# Patient Record
Sex: Female | Born: 1960 | Race: White | Hispanic: No | Marital: Married | Smoking: Former smoker
Health system: Southern US, Community
[De-identification: ages and names within clinical notes are randomized; demographics above are authoritative.]

## PROBLEM LIST (undated history)

## (undated) DIAGNOSIS — Z8669 Personal history of other diseases of the nervous system and sense organs: Secondary | ICD-10-CM

## (undated) DIAGNOSIS — M5030 Other cervical disc degeneration, unspecified cervical region: Secondary | ICD-10-CM

## (undated) DIAGNOSIS — E0500 Thyrotoxicosis with diffuse goiter without thyrotoxic crisis or storm: Secondary | ICD-10-CM

## (undated) DIAGNOSIS — M47812 Spondylosis without myelopathy or radiculopathy, cervical region: Secondary | ICD-10-CM

## (undated) DIAGNOSIS — F329 Major depressive disorder, single episode, unspecified: Secondary | ICD-10-CM

## (undated) DIAGNOSIS — F419 Anxiety disorder, unspecified: Secondary | ICD-10-CM

## (undated) DIAGNOSIS — N63 Unspecified lump in unspecified breast: Secondary | ICD-10-CM

## (undated) DIAGNOSIS — Z803 Family history of malignant neoplasm of breast: Secondary | ICD-10-CM

## (undated) DIAGNOSIS — M549 Dorsalgia, unspecified: Secondary | ICD-10-CM

## (undated) DIAGNOSIS — M5136 Other intervertebral disc degeneration, lumbar region: Secondary | ICD-10-CM

## (undated) DIAGNOSIS — E785 Hyperlipidemia, unspecified: Secondary | ICD-10-CM

## (undated) DIAGNOSIS — M542 Cervicalgia: Secondary | ICD-10-CM

## (undated) DIAGNOSIS — G4700 Insomnia, unspecified: Secondary | ICD-10-CM

## (undated) DIAGNOSIS — N390 Urinary tract infection, site not specified: Secondary | ICD-10-CM

## (undated) DIAGNOSIS — R002 Palpitations: Secondary | ICD-10-CM

## (undated) HISTORY — DX: Other intervertebral disc degeneration, lumbar region: M51.36

## (undated) HISTORY — DX: Major depressive disorder, single episode, unspecified: F32.9

## (undated) HISTORY — DX: Urinary tract infection, site not specified: N39.0

## (undated) HISTORY — PX: BREAST BIOPSY: SHX20

## (undated) HISTORY — DX: Other cervical disc degeneration, unspecified cervical region: M50.30

## (undated) HISTORY — DX: Cervicalgia: M54.2

## (undated) HISTORY — DX: Palpitations: R00.2

## (undated) HISTORY — DX: Family history of malignant neoplasm of breast: Z80.3

## (undated) HISTORY — DX: Unspecified lump in unspecified breast: N63.0

## (undated) HISTORY — DX: Dorsalgia, unspecified: M54.9

## (undated) HISTORY — DX: Anxiety disorder, unspecified: F41.9

## (undated) HISTORY — DX: Personal history of other diseases of the nervous system and sense organs: Z86.69

## (undated) HISTORY — DX: Hyperlipidemia, unspecified: E78.5

## (undated) HISTORY — PX: REDUCTION MAMMAPLASTY: SUR839

## (undated) HISTORY — DX: Thyrotoxicosis with diffuse goiter without thyrotoxic crisis or storm: E05.00

## (undated) HISTORY — DX: Insomnia, unspecified: G47.00

## (undated) HISTORY — DX: Spondylosis without myelopathy or radiculopathy, cervical region: M47.812

---

## 2005-12-08 ENCOUNTER — Emergency Department (HOSPITAL_COMMUNITY): Admission: EM | Admit: 2005-12-08 | Discharge: 2005-12-08 | Payer: Self-pay | Admitting: Family Medicine

## 2006-06-04 ENCOUNTER — Ambulatory Visit: Payer: Self-pay | Admitting: Internal Medicine

## 2006-06-07 ENCOUNTER — Encounter: Admission: RE | Admit: 2006-06-07 | Discharge: 2006-06-07 | Payer: Self-pay | Admitting: Internal Medicine

## 2006-06-22 ENCOUNTER — Encounter: Admission: RE | Admit: 2006-06-22 | Discharge: 2006-06-22 | Payer: Self-pay | Admitting: Internal Medicine

## 2006-07-24 ENCOUNTER — Ambulatory Visit: Payer: Self-pay | Admitting: Family Medicine

## 2007-01-19 ENCOUNTER — Telehealth: Payer: Self-pay | Admitting: Internal Medicine

## 2007-02-18 ENCOUNTER — Telehealth: Payer: Self-pay | Admitting: Internal Medicine

## 2007-05-11 ENCOUNTER — Ambulatory Visit: Payer: Self-pay | Admitting: Internal Medicine

## 2007-05-11 DIAGNOSIS — N390 Urinary tract infection, site not specified: Secondary | ICD-10-CM | POA: Insufficient documentation

## 2007-05-11 HISTORY — DX: Urinary tract infection, site not specified: N39.0

## 2007-05-11 LAB — CONVERTED CEMR LAB
Bacteria, UA: NEGATIVE
Crystals: NEGATIVE
Mucus, UA: NEGATIVE
Total Protein, Urine: NEGATIVE mg/dL
pH: 6.5 (ref 5.0–8.0)

## 2007-07-26 ENCOUNTER — Ambulatory Visit: Payer: Self-pay | Admitting: Internal Medicine

## 2007-07-26 DIAGNOSIS — M542 Cervicalgia: Secondary | ICD-10-CM | POA: Insufficient documentation

## 2007-07-26 DIAGNOSIS — F329 Major depressive disorder, single episode, unspecified: Secondary | ICD-10-CM

## 2007-07-26 DIAGNOSIS — M549 Dorsalgia, unspecified: Secondary | ICD-10-CM

## 2007-07-26 DIAGNOSIS — F32A Depression, unspecified: Secondary | ICD-10-CM | POA: Insufficient documentation

## 2007-07-26 DIAGNOSIS — F3289 Other specified depressive episodes: Secondary | ICD-10-CM

## 2007-07-26 HISTORY — DX: Dorsalgia, unspecified: M54.9

## 2007-07-26 HISTORY — DX: Major depressive disorder, single episode, unspecified: F32.9

## 2007-07-26 HISTORY — DX: Cervicalgia: M54.2

## 2007-07-26 HISTORY — DX: Other specified depressive episodes: F32.89

## 2007-08-24 ENCOUNTER — Encounter: Admission: RE | Admit: 2007-08-24 | Discharge: 2007-11-22 | Payer: Self-pay | Admitting: Internal Medicine

## 2007-08-26 ENCOUNTER — Encounter: Payer: Self-pay | Admitting: Internal Medicine

## 2007-10-10 ENCOUNTER — Encounter: Payer: Self-pay | Admitting: Internal Medicine

## 2007-11-17 ENCOUNTER — Encounter: Admission: RE | Admit: 2007-11-17 | Discharge: 2007-11-17 | Payer: Self-pay | Admitting: Internal Medicine

## 2007-11-17 LAB — HM MAMMOGRAPHY

## 2007-11-30 ENCOUNTER — Encounter: Admission: RE | Admit: 2007-11-30 | Discharge: 2007-11-30 | Payer: Self-pay | Admitting: Internal Medicine

## 2007-12-02 ENCOUNTER — Encounter: Payer: Self-pay | Admitting: Internal Medicine

## 2008-01-02 ENCOUNTER — Telehealth (INDEPENDENT_AMBULATORY_CARE_PROVIDER_SITE_OTHER): Payer: Self-pay | Admitting: *Deleted

## 2008-06-21 ENCOUNTER — Encounter: Payer: Self-pay | Admitting: Internal Medicine

## 2008-08-30 ENCOUNTER — Emergency Department (HOSPITAL_COMMUNITY): Admission: EM | Admit: 2008-08-30 | Discharge: 2008-08-30 | Payer: Self-pay | Admitting: Emergency Medicine

## 2008-09-21 ENCOUNTER — Ambulatory Visit: Payer: Self-pay | Admitting: Internal Medicine

## 2008-09-21 DIAGNOSIS — R002 Palpitations: Secondary | ICD-10-CM

## 2008-09-21 HISTORY — DX: Palpitations: R00.2

## 2008-10-29 ENCOUNTER — Encounter (INDEPENDENT_AMBULATORY_CARE_PROVIDER_SITE_OTHER): Payer: Self-pay | Admitting: Obstetrics & Gynecology

## 2008-10-29 ENCOUNTER — Inpatient Hospital Stay (HOSPITAL_COMMUNITY): Admission: RE | Admit: 2008-10-29 | Discharge: 2008-11-01 | Payer: Self-pay | Admitting: Obstetrics & Gynecology

## 2008-11-02 ENCOUNTER — Encounter: Admission: RE | Admit: 2008-11-02 | Discharge: 2008-11-06 | Payer: Self-pay | Admitting: Obstetrics & Gynecology

## 2008-11-14 ENCOUNTER — Telehealth: Payer: Self-pay | Admitting: Internal Medicine

## 2008-11-14 DIAGNOSIS — N63 Unspecified lump in unspecified breast: Secondary | ICD-10-CM | POA: Insufficient documentation

## 2008-11-14 HISTORY — DX: Unspecified lump in unspecified breast: N63.0

## 2008-11-19 ENCOUNTER — Encounter: Admission: RE | Admit: 2008-11-19 | Discharge: 2008-11-19 | Payer: Self-pay | Admitting: Internal Medicine

## 2008-12-03 ENCOUNTER — Telehealth: Payer: Self-pay | Admitting: Internal Medicine

## 2008-12-05 ENCOUNTER — Ambulatory Visit: Payer: Self-pay | Admitting: Internal Medicine

## 2009-01-02 ENCOUNTER — Telehealth: Payer: Self-pay | Admitting: Internal Medicine

## 2009-01-09 ENCOUNTER — Telehealth: Payer: Self-pay | Admitting: Internal Medicine

## 2009-03-22 ENCOUNTER — Ambulatory Visit: Payer: Self-pay | Admitting: Internal Medicine

## 2009-03-22 LAB — CONVERTED CEMR LAB
ALT: 18 units/L (ref 0–35)
BUN: 18 mg/dL (ref 6–23)
Basophils Relative: 0 % (ref 0.0–3.0)
Chloride: 102 meq/L (ref 96–112)
Cholesterol: 183 mg/dL (ref 0–200)
Eosinophils Relative: 6.7 % — ABNORMAL HIGH (ref 0.0–5.0)
HCT: 37.2 % (ref 36.0–46.0)
Lymphs Abs: 1.3 10*3/uL (ref 0.7–4.0)
MCV: 95 fL (ref 78.0–100.0)
Monocytes Absolute: 0.5 10*3/uL (ref 0.1–1.0)
Platelets: 294 10*3/uL (ref 150.0–400.0)
Potassium: 4.5 meq/L (ref 3.5–5.1)
RBC: 3.91 M/uL (ref 3.87–5.11)
Specific Gravity, Urine: 1.02 (ref 1.000–1.030)
Total Protein, Urine: NEGATIVE mg/dL
Total Protein: 6.8 g/dL (ref 6.0–8.3)
Triglycerides: 106 mg/dL (ref 0.0–149.0)
Urine Glucose: NEGATIVE mg/dL
Urobilinogen, UA: 0.2 (ref 0.0–1.0)
WBC: 5.2 10*3/uL (ref 4.5–10.5)

## 2009-06-07 ENCOUNTER — Ambulatory Visit: Payer: Self-pay | Admitting: Internal Medicine

## 2009-06-07 DIAGNOSIS — M5030 Other cervical disc degeneration, unspecified cervical region: Secondary | ICD-10-CM | POA: Insufficient documentation

## 2009-06-07 HISTORY — DX: Other cervical disc degeneration, unspecified cervical region: M50.30

## 2009-07-24 ENCOUNTER — Telehealth: Payer: Self-pay | Admitting: Internal Medicine

## 2009-07-25 ENCOUNTER — Encounter: Payer: Self-pay | Admitting: Internal Medicine

## 2009-08-23 ENCOUNTER — Telehealth: Payer: Self-pay | Admitting: Internal Medicine

## 2009-09-23 ENCOUNTER — Telehealth: Payer: Self-pay | Admitting: Internal Medicine

## 2009-12-16 ENCOUNTER — Encounter: Payer: Self-pay | Admitting: Internal Medicine

## 2010-03-02 ENCOUNTER — Encounter: Payer: Self-pay | Admitting: Internal Medicine

## 2010-03-11 NOTE — Progress Notes (Signed)
Summary: Rf Hydrco/Acetamin  Phone Note Refill Request Message from:  Pharmacy  Refills Requested: Medication #1:  HYDROCODONE-ACETAMINOPHEN 5-325 MG TABS 1po q 6 hrs as needed pain   Dosage confirmed as above?Dosage Confirmed   Supply Requested: 100   Last Refilled: 06/30/2009  Method Requested: Telephone to Pharmacy Next Appointment Scheduled: none Initial call taken by: Lanier Prude, [facility]),  August 23, 2009 8:22 AM  Follow-up for Phone Call        this was not meant to be a long term med  - did she see ortho may 25?    if so, I will need a report from that office Follow-up by: Corwin Levins MD,  August 23, 2009 8:29 AM  Additional Follow-up for Phone Call Additional follow up Details #1::        Return call to pt, no answer.. will try again later.Marland KitchenMarland KitchenAlvy Beal Archie CMA  August 23, 2009 8:51 AM   No answer/no machine. Lucious Groves CMA  August 26, 2009 9:18 AM   No answer, no VM. Margaret Pyle, CMA  August 27, 2009 9:04 AM     Additional Follow-up for Phone Call Additional follow up Details #2::    No answer, no VM. 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 Unable to locate pt. Closing phone note until further contact from pt Follow-up by: Margaret Pyle, CMA,  August 28, 2009 9:00 AM

## 2010-03-11 NOTE — Consult Note (Signed)
Summary: [facility]  [facility]   Imported By: Lennie Odor 12/19/2009 14:41:35  _____________________________________________________________________  External Attachment:    Type:   Image     Comment:   External Document

## 2010-03-11 NOTE — Progress Notes (Signed)
Summary: medication refill  Phone Note Refill Request Message from:  Fax from Pharmacy on September 23, 2009 9:03 AM  Refills Requested: Medication #1:  CLONAZEPAM 0.5 MG TABS 1 by mouth two times a day as needed   Dosage confirmed as above?Dosage Confirmed   Last Refilled: 03/22/2009   Notes: [facility], [phone] Initial call taken by: Zella Ball Ewing CMA Duncan Dull),  September 23, 2009 9:03 AM    New/Updated Medications: CLONAZEPAM 0.5 MG TABS (CLONAZEPAM) 1 by mouth two times a day as needed Prescriptions: CLONAZEPAM 0.5 MG TABS (CLONAZEPAM) 1 by mouth two times a day as needed  #60 x 5   Entered and Authorized by:   Corwin Levins MD   Signed by:   Corwin Levins MD on 09/23/2009   Method used:   Print then Give to Patient   RxID:   [id number]  done hardcopy to LIM side B - dahlia  Corwin Levins MD  September 23, 2009 1:16 PM   Rx faxed to pharmacy Margaret Pyle, CMA  September 23, 2009 1:25 PM

## 2010-03-11 NOTE — Progress Notes (Signed)
Summary: Omeprazole PA  Phone Note From Pharmacy   Details of Request: [employer] ID:  [id number] Details of Action Taken: [id number] Summary of Call: PA request--Omeprazole. Form requested. Initial call taken by: Lucious Groves,  July 24, 2009 3:08 PM  Follow-up for Phone Call        Form completed and faxed back, will await insurance company reply. Lucious Groves  July 25, 2009 11:36 AM      Appended Document: Omeprazole PA Called automated line and prescription is approved until 07/2010

## 2010-03-11 NOTE — Assessment & Plan Note (Signed)
Summary: DISCUSS PAIN MEDS/NWS   Vital Signs:  Patient profile:   50 year old female Height:      66 inches Weight:      142.75 pounds BMI:     23.12 O2 Sat:      97 % on Room air Temp:     97.6 degrees F oral Pulse rate:   67 / minute BP sitting:   100 / 60  (left arm) Cuff size:   regular  Vitals Entered ByZella Ball Ewing (June 07, 2009 3:04 PM)  O2 Flow:  Room air CC: Discuss Pain medication/RE   CC:  Discuss Pain medication/RE.  History of Present Illness: naporosyn helps but still with significant neck pain  constant; with now freq breakthrough pain , tramadol and muscle relaxer dont work at all;  has appt with dr Tad Moore on may 25;  going to [place] next wk;  overall pain now at least 5-7/10, denies radicalar pain, or extremity pain, weak, numbness, bowel or bladder change, fever, night sweats, wt loss or other constituioinal symtpoms.    Problems Prior to Update: 1)  Preventive Health Care  (ICD-V70.0) 2)  Lump or Mass in Breast  (ICD-611.72) 3)  Palpitations  (ICD-785.1) 4)  Depressive Disorder  (ICD-311) 5)  Back Pain  (ICD-724.5) 6)  Cervicalgia  (ICD-723.1) 7)  Uti  (ICD-599.0)  Medications Prior to Update: 1)  Pre-Natal Formula  Tabs (Prenatal Multivit-Min-Fe-Fa) .... Tab By Mouth Once Daily 2)  Calcium 500 Mg Tabs (Calcium Carbonate) .Marland Kitchen.. 1 Tab By Mouth Once Daily 3)  Clonazepam 0.5 Mg Tabs (Clonazepam) .Marland Kitchen.. 1 By Mouth Two Times A Day As Needed 4)  Naprelan 375 Mg Xr24h-Tab (Naproxen Sodium) .... 2 By Mouth Once Daily 5)  Omeprazole 20 Mg Cpdr (Omeprazole) .Marland Kitchen.. 1 By Mouth Once Daily  Current Medications (verified): 1)  Pre-Natal Formula  Tabs (Prenatal Multivit-Min-Fe-Fa) .... Tab By Mouth Once Daily 2)  Calcium 500 Mg Tabs (Calcium Carbonate) .Marland Kitchen.. 1 Tab By Mouth Once Daily 3)  Clonazepam 0.5 Mg Tabs (Clonazepam) .Marland Kitchen.. 1 By Mouth Two Times A Day As Needed 4)  Naprelan 375 Mg Xr24h-Tab (Naproxen Sodium) .... 2 By Mouth Once Daily 5)  Omeprazole 20 Mg Cpdr  (Omeprazole) .Marland Kitchen.. 1 By Mouth Once Daily 6)  Hydrocodone-Acetaminophen 5-325 Mg Tabs (Hydrocodone-Acetaminophen) .Marland Kitchen.. 1po Q 6 Hrs As Needed Pain 7)  Cymbalta 60 Mg Cpep (Duloxetine Hcl) .Marland Kitchen.. 1po Once Daily  Allergies (verified): No Known Drug Allergies  Past History:  Past Medical History: Last updated: 09/18/2008 History of Grave's Disease / hyperthyroidism History of Migraine headaches  Past Surgical History: Last updated: 09/18/2008 Breast biopsy  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 from previous marriage that lives in [place] 2nd child born [place], sept 2010 Former Smoker - smoked for 6 yrs, quit 15 yrs ago Alcohol use-yes Drug use-no  Risk Factors: Smoking Status: quit (05/11/2007)  Review of Systems       all otherwise negative per pt -    Physical Exam  General:  alert and well-developed.   Head:  normocephalic and atraumatic.   Eyes:  vision grossly intact, pupils equal, and pupils round.   Ears:  R ear normal and L ear normal.   Nose:  no external deformity and no nasal discharge.   Mouth:  no gingival abnormalities and pharynx pink and moist.   Neck:  supple and no masses.   Lungs:  normal respiratory effort and normal breath sounds.  Heart:  normal rate and regular rhythm.   Msk:  no joint tenderness and no joint swelling. , no spine tenderness or spasm   Extremities:  no edema, no erythema  Neurologic:  alert & oriented X3, strength normal in all extremities, and gait normal.   Skin:  color normal and no rashes.   Psych:  dysphoric affect and moderately anxious.     Impression & Recommendations:  Problem # 1:  CERVICALGIA (ICD-723.1)  Her updated medication list for this problem includes:    Naprelan 375 Mg Xr24h-tab (Naproxen sodium) .Marland Kitchen... 2 by mouth once daily    Hydrocodone-acetaminophen 5-325 Mg Tabs (Hydrocodone-acetaminophen) .Marland Kitchen... 1po q 6 hrs as needed pain with known cervical disc  dz (reviewed most recnet neck films with pt) ;  to add cymbalta 30 for1 wk, then 60 after that, and vicodin as needed breakthrough pain, to see ortho may 25 as planned, treat as above, f/u any worsening signs or symptoms   Problem # 2:  DEPRESSIVE DISORDER (ICD-311)  Her updated medication list for this problem includes:    Clonazepam 0.5 Mg Tabs (Clonazepam) .Marland Kitchen... 1 by mouth two times a day as needed    Cymbalta 60 Mg Cpep (Duloxetine hcl) .Marland Kitchen... 1po once daily treat as above, f/u any worsening signs or symptoms   Problem # 3:  DEGENERATIVE DISC DISEASE, CERVICAL SPINE (ICD-722.4) as above, most likely source of pain, d/w pt   Complete Medication List: 1)  Pre-natal Formula Tabs (Prenatal multivit-min-fe-fa) .... Tab by mouth once daily 2)  Calcium 500 Mg Tabs (Calcium carbonate) .Marland Kitchen.. 1 tab by mouth once daily 3)  Clonazepam 0.5 Mg Tabs (Clonazepam) .Marland Kitchen.. 1 by mouth two times a day as needed 4)  Naprelan 375 Mg Xr24h-tab (Naproxen sodium) .... 2 by mouth once daily 5)  Omeprazole 20 Mg Cpdr (Omeprazole) .Marland Kitchen.. 1 by mouth once daily 6)  Hydrocodone-acetaminophen 5-325 Mg Tabs (Hydrocodone-acetaminophen) .Marland Kitchen.. 1po q 6 hrs as needed pain 7)  Cymbalta 60 Mg Cpep (Duloxetine hcl) .Marland Kitchen.. 1po once daily  Patient Instructions: 1)  Please take all new medications as prescribed  - the cymbalta is 30mg  per day for one wk, then 60 mg per day after that 2)  take the hydrocodone for breakthrough pain as needed 3)  Continue all previous medications as before this visit  4)  Please keep your appt with orthopedic may 25 as planned 5)  Please schedule a follow-up appointment as needed. Prescriptions: CYMBALTA 60 MG CPEP (DULOXETINE HCL) 1po once daily  #30 x 11   Entered and Authorized by:   Corwin Levins MD   Signed by:   Corwin Levins MD on 06/07/2009   Method used:   Print then Give to Patient   RxID:   [id number] HYDROCODONE-ACETAMINOPHEN 5-325 MG TABS (HYDROCODONE-ACETAMINOPHEN) 1po q 6 hrs as  needed pain  #100 x 1   Entered and Authorized by:   Corwin Levins MD   Signed by:   Corwin Levins MD on 06/07/2009   Method used:   Print then Give to Patient   RxID:   [id number]

## 2010-03-11 NOTE — Medication Information (Signed)
Summary: Prior Auth/medco  Prior Auth/medco   Imported By: Lester Bessemer 07/29/2009 08:08:33  _____________________________________________________________________  External Attachment:    Type:   Image     Comment:   External Document

## 2010-03-11 NOTE — Assessment & Plan Note (Signed)
Summary: fu--med-stc   Vital Signs:  Patient profile:   50 year old female Height:      66 inches Weight:      145 pounds BMI:     23.49 O2 Sat:      95 % on Room air Temp:     97 degrees F oral Pulse rate:   91 / minute BP sitting:   100 / 70  (left arm) Cuff size:   regular  Vitals Entered ByZella Ball Ewing (March 22, 2009 3:52 PM)  O2 Flow:  Room air  Preventive Care Screening     declines colonoscopy, tetanus,flu shots  CC: followup on meds/RE   CC:  followup on meds/RE.  History of Present Illness: overall doing well, no complaints, Pt denies CP, sob, doe, wheezing, orthopnea, pnd, worsening LE edema, palps, dizziness or syncope   Pt denies new neuro symptoms such as headache, facial or extremity weakness .  has recurring mild lower back discomfort no change in 3 mo since last seen ortho and tx with naprelan ER;  no GI upset or bleeding;  no bowel or baldder change, fever, or change in LE pain, weakness or numbness.   Problems Prior to Update: 1)  Preventive Health Care  (ICD-V70.0) 2)  Lump or Mass in Breast  (ICD-611.72) 3)  Palpitations  (ICD-785.1) 4)  Depressive Disorder  (ICD-311) 5)  Back Pain  (ICD-724.5) 6)  Cervicalgia  (ICD-723.1) 7)  Uti  (ICD-599.0)  Medications Prior to Update: 1)  Pre-Natal Formula  Tabs (Prenatal Multivit-Min-Fe-Fa) .... Tab By Mouth Once Daily 2)  Calcium 500 Mg Tabs (Calcium Carbonate) .Marland Kitchen.. 1 Tab By Mouth Once Daily 3)  Tramadol Hcl 50 Mg Tabs (Tramadol Hcl) .Marland Kitchen.. 1 By Mouth Q 6 Hrs As Needed 4)  Flexeril 5 Mg Tabs (Cyclobenzaprine Hcl) .Marland Kitchen.. 1po Three Times A Day As Needed 5)  Clonazepam 0.5 Mg Tabs (Clonazepam) .Marland Kitchen.. 1 By Mouth Two Times A Day As Needed  Current Medications (verified): 1)  Pre-Natal Formula  Tabs (Prenatal Multivit-Min-Fe-Fa) .... Tab By Mouth Once Daily 2)  Calcium 500 Mg Tabs (Calcium Carbonate) .Marland Kitchen.. 1 Tab By Mouth Once Daily 3)  Clonazepam 0.5 Mg Tabs (Clonazepam) .Marland Kitchen.. 1 By Mouth Two Times A Day As  Needed 4)  Naprelan 375 Mg Xr24h-Tab (Naproxen Sodium) .... 2 By Mouth Once Daily 5)  Omeprazole 20 Mg Cpdr (Omeprazole) .Marland Kitchen.. 1 By Mouth Once Daily  Allergies (verified): No Known Drug Allergies  Past History:  Past Medical History: Last updated: 09/18/2008 History of Grave's Disease / hyperthyroidism History of Migraine headaches  Past Surgical History: Last updated: 09/18/2008 Breast biopsy  Family History: Last updated: 10-21-08 Father deceased at 74 secondary to complications of lung cancer.  He was a smoker.  Mother at age 66, in addition to breast cancer, is hypertensive.  No family history of colon cancer, heart disease, or stroke, or type 2 diabetes.  No family history of sudden death.  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 from previous marriage that lives in [place] 2nd child born [place], sept 2010 Former Smoker - smoked for 6 yrs, quit 15 yrs ago Alcohol use-yes Drug use-no  Risk Factors: Smoking Status: quit (05/11/2007)  Review of Systems  The patient denies anorexia, fever, weight loss, weight gain, vision loss, decreased hearing, hoarseness, chest pain, syncope, dyspnea on exertion, peripheral edema, prolonged cough, headaches, hemoptysis, abdominal pain, melena, hematochezia, severe indigestion/heartburn, hematuria, incontinence, muscle weakness, suspicious skin lesions,  transient blindness, difficulty walking, depression, unusual weight change, abnormal bleeding, enlarged lymph nodes, and angioedema.         all otherwise negative per pt -   Physical Exam  General:  alert and well-developed.   Head:  normocephalic and atraumatic.   Eyes:  vision grossly intact, pupils equal, and pupils round.   Ears:  R ear normal and L ear normal.   Nose:  no external deformity and no nasal discharge.   Mouth:  no gingival abnormalities and pharynx pink and moist.   Neck:  supple and no masses.   Lungs:   normal respiratory effort and normal breath sounds.   Heart:  normal rate and regular rhythm.   Abdomen:  soft, non-tender, and normal bowel sounds.   Msk:  no joint tenderness and no joint swelling.   Extremities:  no edema, no erythema  Neurologic:  cranial nerves II-XII intact and strength normal in all extremities.     Impression & Recommendations:  Problem # 1:  Preventive Health Care (ICD-V70.0)  Overall doing well, age appropriate education and counseling updated and referral for appropriate preventive services done unless declined, immunizations up to date or declined, diet counseling done if overweight, urged to quit smoking if smokes , most recent labs reviewed and current ordered if appropriate, ecg reviewed or declined (interpretation per ECG scanned in the EMR if done); information regarding Medicare Prevention requirements given if appropriate , decliens flu shot, tetanus and colonoscopy  Orders: TLB-BMP (Basic Metabolic Panel-BMET) (80048-METABOL) TLB-CBC Platelet - w/Differential (85025-CBCD) TLB-Hepatic/Liver Function Pnl (80076-HEPATIC) TLB-Lipid Panel (80061-LIPID) TLB-TSH (Thyroid Stimulating Hormone) (84443-TSH) TLB-Udip ONLY (81003-UDIP)  Problem # 2:  BACK PAIN (ICD-724.5)  The following medications were removed from the medication list:    Tramadol Hcl 50 Mg Tabs (Tramadol hcl) .Marland Kitchen... 1 by mouth q 6 hrs as needed    Flexeril 5 Mg Tabs (Cyclobenzaprine hcl) .Marland Kitchen... 1po three times a day as needed Her updated medication list for this problem includes:    Naprelan 375 Mg Xr24h-tab (Naproxen sodium) .Marland Kitchen... 2 by mouth once daily treat as above, f/u any worsening signs or symptoms , also add PPI for gastric prevention  Complete Medication List: 1)  Pre-natal Formula Tabs (Prenatal multivit-min-fe-fa) .... Tab by mouth once daily 2)  Calcium 500 Mg Tabs (Calcium carbonate) .Marland Kitchen.. 1 tab by mouth once daily 3)  Clonazepam 0.5 Mg Tabs (Clonazepam) .Marland Kitchen.. 1 by mouth two times a  day as needed 4)  Naprelan 375 Mg Xr24h-tab (Naproxen sodium) .... 2 by mouth once daily 5)  Omeprazole 20 Mg Cpdr (Omeprazole) .Marland Kitchen.. 1 by mouth once daily  Patient Instructions: 1)  Please take all new medications as prescribed  2)  Continue all previous medications as before this visit 3)  Please go to the Lab in the basement for your blood and/or urine tests today  4)  Please schedule a follow-up appointment in 1 year or sooner if needed Prescriptions: CLONAZEPAM 0.5 MG TABS (CLONAZEPAM) 1 by mouth two times a day as needed  #60 x 5   Entered and Authorized by:   Corwin Levins MD   Signed by:   Corwin Levins MD on 03/22/2009   Method used:   Print then Give to Patient   RxID:   [id number] NAPRELAN 375 MG XR24H-TAB (NAPROXEN SODIUM) 2 by mouth once daily  #60 x 5   Entered and Authorized by:   Corwin Levins MD   Signed by:   Len Blalock  John MD on 03/22/2009   Method used:   Print then Give to Patient   RxID:   [id number] OMEPRAZOLE 20 MG CPDR (OMEPRAZOLE) 1 by mouth once daily  #90 x 3   Entered and Authorized by:   Corwin Levins MD   Signed by:   Corwin Levins MD on 03/22/2009   Method used:   Print then Give to Patient   RxID:   [id number]

## 2010-05-16 LAB — CBC
Hemoglobin: 8.7 g/dL — ABNORMAL LOW (ref 12.0–15.0)
MCV: 90.3 fL (ref 78.0–100.0)
Platelets: 177 10*3/uL (ref 150–400)
RDW: 16.2 % — ABNORMAL HIGH (ref 11.5–15.5)
RDW: 16.2 % — ABNORMAL HIGH (ref 11.5–15.5)
WBC: 5.4 10*3/uL (ref 4.0–10.5)

## 2010-05-16 LAB — RPR: RPR Ser Ql: NONREACTIVE

## 2010-06-01 ENCOUNTER — Encounter: Payer: Self-pay | Admitting: Internal Medicine

## 2010-06-01 DIAGNOSIS — Z0000 Encounter for general adult medical examination without abnormal findings: Secondary | ICD-10-CM | POA: Insufficient documentation

## 2010-06-01 DIAGNOSIS — Z0001 Encounter for general adult medical examination with abnormal findings: Secondary | ICD-10-CM | POA: Insufficient documentation

## 2010-06-02 ENCOUNTER — Ambulatory Visit (INDEPENDENT_AMBULATORY_CARE_PROVIDER_SITE_OTHER): Payer: BC Managed Care – PPO | Admitting: Internal Medicine

## 2010-06-02 ENCOUNTER — Encounter: Payer: Self-pay | Admitting: Internal Medicine

## 2010-06-02 VITALS — BP 112/70 | HR 73 | Temp 98.0°F | Ht 66.0 in | Wt 148.0 lb

## 2010-06-02 DIAGNOSIS — G4700 Insomnia, unspecified: Secondary | ICD-10-CM

## 2010-06-02 DIAGNOSIS — L989 Disorder of the skin and subcutaneous tissue, unspecified: Secondary | ICD-10-CM

## 2010-06-02 DIAGNOSIS — F411 Generalized anxiety disorder: Secondary | ICD-10-CM

## 2010-06-02 DIAGNOSIS — F419 Anxiety disorder, unspecified: Secondary | ICD-10-CM

## 2010-06-02 DIAGNOSIS — Z0000 Encounter for general adult medical examination without abnormal findings: Secondary | ICD-10-CM

## 2010-06-02 HISTORY — DX: Insomnia, unspecified: G47.00

## 2010-06-02 HISTORY — DX: Anxiety disorder, unspecified: F41.9

## 2010-06-02 MED ORDER — CLONAZEPAM 0.5 MG PO TABS: 0.5000 mg | ORAL_TABLET | Freq: Two times a day (BID) | ORAL | Status: DC | PRN

## 2010-06-02 MED ORDER — TRIAMCINOLONE ACETONIDE 0.1 % EX CREA: TOPICAL_CREAM | Freq: Two times a day (BID) | CUTANEOUS | Status: DC

## 2010-06-02 NOTE — Assessment & Plan Note (Signed)
Mild to mod ongoing, mostly likely related to her chronic pain, declines need for counseling, or trial of SSRI such as lexapro,  to f/u any worsening symptoms or concerns

## 2010-06-02 NOTE — Assessment & Plan Note (Signed)
New x 4 mo, benign appearing , will try triam cr and refer to derm, pt is reassured very unlikely to represent malignancy, but may need removed per derm;  to f/u any worsening symptoms or concerns

## 2010-06-02 NOTE — Patient Instructions (Addendum)
Take all new medications as prescribed Continue all other medications as before You will be contacted regarding the referral for: dermatology Please return in 6 mo with Lab testing done 3-5 days before

## 2010-06-02 NOTE — Progress Notes (Signed)
Subjective:    Patient ID: Cassandra Gallagher, female    DOB: [birth year], 50 y.o.   MRN: [id number]  HPI  Here to f/uy to c/o 4 mo worsening skin lesion to left dorsal foot, started as small, nontender tan nonraised, now with wearing certain shoes (which she states do not seem to pinch or irratate) has now become a larger, red, mild tender firm nodular lesion raised that wont seem to get better.  Pt denies chest pain, increased sob or doe, wheezing, orthopnea, PND, increased LE swelling, palpitations, dizziness or syncope.  Denies worsening depressive symptoms, suicidal ideation, or panic.  Does still need the clonazepam for sleep.  Unfortunately the trial of cymbalta for pain/depression/anxiety did not seem to help, so she d/c some time ago.  Still sees Dr Minna Merritts for ongoing chronic spine pain,  Has had several ESI that helped temporarily Past Medical History  Diagnosis Date  . DEPRESSIVE DISORDER 07/26/2007  . UTI 05/11/2007  . Lump or mass in breast 11/14/2008  . 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 Cervicalgia 07/26/2007  . BACK PAIN 07/26/2007  . Palpitations 09/21/2008  . Grave's disease     hx of Grave's Disease/Hyperthyroidism  . Hx of migraines    Past Surgical History  Procedure Date  . Breast biopsy     reports that she has quit smoking. She does not have any smokeless tobacco history on file. She reports that she does not drink alcohol or use illicit drugs. family history includes Hypertension in her mother. No Known Allergies Current Outpatient Prescriptions on File Prior to Visit  Medication Sig Dispense Refill  . omeprazole (PRILOSEC) 20 MG capsule Take 20 mg by mouth daily.        Marland Kitchen DISCONTD: clonazePAM (KLONOPIN) 0.5 MG tablet Take 0.5 mg by mouth 2 (two) times daily as needed.        . Calcium Carbonate (CALCIUM 500 PO) Take by mouth daily.        Marland Kitchen HYDROcodone-acetaminophen (NORCO) 5-325 MG per tablet Take 1 tablet by mouth every 6 (six) hours as needed.         . Naproxen Sodium (NAPRELAN) 375 MG TB24 2 (two) times daily.        . Prenatal Multivit-Min-Fe-FA (PRE-NATAL FORMULA) TABS Take by mouth daily.        Marland Kitchen DISCONTD: DULoxetine (CYMBALTA) 60 MG capsule Take 60 mg by mouth daily.          Review of Systems Review of Systems  Constitutional: Negative for diaphoresis and unexpected weight change.  HENT: Negative for drooling and tinnitus.   Eyes: Negative for photophobia and visual disturbance.  Respiratory: Negative for choking and stridor.   Gastrointestinal: Negative for vomiting and blood in stool.  Genitourinary: Negative for hematuria and decreased urine volume.  Musculoskeletal: Negative for gait problem.  Skin: Negative for color change and wound.  Neurological: Negative for tremors and numbness.  Psychiatric/Behavioral: Negative for decreased concentration. The patient is not hyperactive.       Objective:   Physical Exam BP 112/70  Pulse 73  Temp(Src) 98 F (36.7 C) (Oral)  Ht 5\' 6"  (1.676 m)  Wt 148 lb (67.132 kg)  BMI 23.89 kg/m2  SpO2 98% Physical Exam  VS noted Constitutional: Pt appears well-developed and well-nourished.  HENT: Head: Normocephalic.  Right Ear: External ear normal.  Left Ear: External ear normal.  Eyes: Conjunctivae and EOM are normal. Pupils are equal, round, and reactive to light.  Neck: Normal  range of motion. Neck supple.  Cardiovascular: Normal rate and regular rhythm.   Pulmonary/Chest: Effort normal and breath sounds normal.  Abd:  Soft, NT, non-distended, + BS Neurological: Pt is alert. No cranial nerve deficit.  Skin: Skin is warm. No erythema.  Left dorsal foot with approx 10 mm red raised firm lesion just medial to the first dorsal tendon , somewhat tender but no fluctuance, drainage, red streaks Psychiatric: Pt behavior is normal. Thought content normal.  2+ nervous        Assessment & Plan:

## 2010-06-02 NOTE — Assessment & Plan Note (Signed)
stable overall by hx and exam, most recent lab reviewed with pt, and pt to continue medical treatment as before - the clonazapem is refilles

## 2010-06-03 ENCOUNTER — Other Ambulatory Visit: Payer: Self-pay

## 2010-06-03 DIAGNOSIS — L989 Disorder of the skin and subcutaneous tissue, unspecified: Secondary | ICD-10-CM

## 2010-06-03 MED ORDER — TRIAMCINOLONE ACETONIDE 0.1 % EX CREA: TOPICAL_CREAM | Freq: Two times a day (BID) | CUTANEOUS | Status: DC

## 2010-06-03 NOTE — Telephone Encounter (Signed)
Patients insurance will only cover 90 days of Triamcinolone 0.1% cream.
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
## 2010-09-12 ENCOUNTER — Other Ambulatory Visit (INDEPENDENT_AMBULATORY_CARE_PROVIDER_SITE_OTHER): Payer: BC Managed Care – PPO

## 2010-09-12 ENCOUNTER — Encounter: Payer: Self-pay | Admitting: Internal Medicine

## 2010-09-12 ENCOUNTER — Telehealth: Payer: Self-pay

## 2010-09-12 ENCOUNTER — Ambulatory Visit (INDEPENDENT_AMBULATORY_CARE_PROVIDER_SITE_OTHER): Payer: BC Managed Care – PPO | Admitting: Internal Medicine

## 2010-09-12 VITALS — BP 122/80 | HR 91 | Temp 98.1°F | Ht 66.0 in | Wt 144.0 lb

## 2010-09-12 DIAGNOSIS — G8929 Other chronic pain: Secondary | ICD-10-CM | POA: Insufficient documentation

## 2010-09-12 DIAGNOSIS — H47099 Other disorders of optic nerve, not elsewhere classified, unspecified eye: Secondary | ICD-10-CM

## 2010-09-12 DIAGNOSIS — Z0000 Encounter for general adult medical examination without abnormal findings: Secondary | ICD-10-CM

## 2010-09-12 LAB — CBC WITH DIFFERENTIAL/PLATELET
Basophils Absolute: 0 10*3/uL (ref 0.0–0.1)
Basophils Relative: 0.5 % (ref 0.0–3.0)
Eosinophils Absolute: 0.2 10*3/uL (ref 0.0–0.7)
HCT: 39.2 % (ref 36.0–46.0)
Hemoglobin: 13 g/dL (ref 12.0–15.0)
Lymphs Abs: 1.3 10*3/uL (ref 0.7–4.0)
MCHC: 33.1 g/dL (ref 30.0–36.0)
MCV: 93.3 fl (ref 78.0–100.0)
Monocytes Absolute: 0.3 10*3/uL (ref 0.1–1.0)
Neutro Abs: 2.5 10*3/uL (ref 1.4–7.7)
RBC: 4.2 Mil/uL (ref 3.87–5.11)
RDW: 12.8 % (ref 11.5–14.6)

## 2010-09-12 LAB — LIPID PANEL
Cholesterol: 188 mg/dL (ref 0–200)
LDL Cholesterol: 123 mg/dL — ABNORMAL HIGH (ref 0–99)
VLDL: 18.8 mg/dL (ref 0.0–40.0)

## 2010-09-12 LAB — HEPATIC FUNCTION PANEL
Alkaline Phosphatase: 56 U/L (ref 39–117)
Bilirubin, Direct: 0 mg/dL (ref 0.0–0.3)
Total Bilirubin: 0.5 mg/dL (ref 0.3–1.2)

## 2010-09-12 LAB — URINALYSIS, ROUTINE W REFLEX MICROSCOPIC
Specific Gravity, Urine: 1.025 (ref 1.000–1.030)
Total Protein, Urine: NEGATIVE
Urine Glucose: NEGATIVE

## 2010-09-12 LAB — BASIC METABOLIC PANEL
BUN: 18 mg/dL (ref 6–23)
Chloride: 105 mEq/L (ref 96–112)
Creatinine, Ser: 0.8 mg/dL (ref 0.4–1.2)
Glucose, Bld: 97 mg/dL (ref 70–99)
Potassium: 3.9 mEq/L (ref 3.5–5.1)

## 2010-09-12 NOTE — Assessment & Plan Note (Signed)
To see Dr Ethelene Hal later today, pt requests rheum labs also such as esr, RF, ANA

## 2010-09-12 NOTE — Telephone Encounter (Signed)
Called patient to ask if any UTI symptoms, left message to call back

## 2010-09-12 NOTE — Progress Notes (Signed)
Subjective:    Patient ID: Cassandra Gallagher, female    DOB: Nov 05, 1960, 50 y.o.   MRN: [id number]  HPI Here for wellness and f/u;  Overall doing ok;  Pt denies CP, worsening SOB, DOE, wheezing, orthopnea, PND, worsening LE edema, palpitations, dizziness or syncope.  Pt denies neurological change such as new Headache, facial or extremity weakness.  Pt denies polydipsia, polyuria, or low sugar symptoms. Pt states overall good compliance with treatment and medications, good tolerability, and trying to follow lower cholesterol diet.  Pt denies worsening depressive symptoms, suicidal ideation or panic. No fever, wt loss, night sweats, loss of appetite, or other constitutional symptoms.  Pt states good ability with ADL's, low fall risk, home safety reviewed and adequate, no significant changes in hearing or vision, and occasionally active with exercise.  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  Pt continues to have recurring neck and LBP without change in severity, bowel or bladder change, fever, wt loss,  worsening LE pain/numbness/weakness, gait change or falls, and sees Dr Ethelene Hal later today.  Now she is wondering if has a "larger problem" and is fearful of something like RA since her mother has that.  Chronic pain is becoming more of an issue for her with the back.  Unfort left her medication for pain in [place] and has taken 12 ASA per day for a few days and actually felt better (since her mother used to take high dose ASA).  No joint pain other than her back signficant except for mild occasionally.  Past Medical History  Diagnosis Date  . DEPRESSIVE DISORDER 07/26/2007  . UTI 05/11/2007  . Lump or mass in breast 11/14/2008  . 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 Cervicalgia 07/26/2007  . BACK PAIN 07/26/2007  . Palpitations 09/21/2008  . Grave's disease     hx  of Grave's Disease/Hyperthyroidism  . Hx of migraines   . Anxiety 06/02/2010  . Insomnia 06/02/2010   Past Surgical History  Procedure Date  . Breast biopsy     reports that she has quit smoking. She does not have any smokeless tobacco history on file. She reports that she does not drink alcohol or use illicit drugs. family history includes Hypertension in her mother. No Known Allergies Current Outpatient Prescriptions on File Prior to Visit  Medication Sig Dispense Refill  . clonazePAM (KLONOPIN) 0.5 MG tablet Take 1 tablet (0.5 mg total) by mouth 2 (two) times daily as needed.  60 tablet  5  . HYDROcodone-acetaminophen (NORCO) 5-325 MG per tablet Take 1 tablet by mouth every 6 (six) hours as needed.        Marland Kitchen omeprazole (PRILOSEC) 20 MG capsule Take 20 mg by mouth daily.        . Calcium Carbonate (CALCIUM 500 PO) Take by mouth daily.        . Naproxen Sodium (NAPRELAN) 375 MG TB24 2 (two) times daily.        . Prenatal Multivit-Min-Fe-FA (PRE-NATAL FORMULA) TABS Take by mouth daily.        Marland Kitchen triamcinolone (KENALOG) 0.1 % cream Apply topically 2 (two) times daily.  90 g  3   Review of Systems Review of Systems  Constitutional: Negative for diaphoresis, activity change, appetite change and unexpected weight change.  HENT: Negative for hearing loss, ear pain, facial swelling, mouth sores and neck stiffness.  Eyes: Negative for pain, redness and visual disturbance.  Respiratory: Negative for shortness of breath and wheezing.   Cardiovascular: Negative for chest pain and palpitations.  Gastrointestinal: Negative for diarrhea, blood in stool, abdominal distention and rectal pain.  Genitourinary: Negative for hematuria, flank pain and decreased urine volume.   Skin: Negative for color change and wound.  Neurological: Negative for syncope and numbness.  Hematological: Negative for adenopathy.  Psychiatric/Behavioral: Negative for hallucinations, self-injury, decreased concentration and  agitation.      Objective:   Physical Exam BP 122/80  Pulse 91  Temp(Src) 98.1 F (36.7 C) (Oral)  Ht 5\' 6"  (1.676 m)  Wt 144 lb (65.318 kg)  BMI 23.24 kg/m2  SpO2 98% Physical Exam  VS noted Constitutional: Pt is oriented to person, place, and time. Appears well-developed and well-nourished.  HENT:  Head: Normocephalic and atraumatic.  Right Ear: External ear normal.  Left Ear: External ear normal.  Nose: Nose normal.  Mouth/Throat: Oropharynx is clear and moist.  Eyes: Conjunctivae and EOM are normal. Pupils are equal, round, and reactive to light.  Neck: Normal range of motion. Neck supple. No JVD present. No tracheal deviation present.  Cardiovascular: Normal rate, regular rhythm, normal heart sounds and intact distal pulses.   Pulmonary/Chest: Effort normal and breath sounds normal.  Abdominal: Soft. Bowel sounds are normal. There is no tenderness.  Musculoskeletal: Normal range of motion. Exhibits no edema.  Lymphadenopathy:  Has no cervical adenopathy.  Neurological: Pt is alert and oriented to person, place, and time. Pt has normal reflexes. No cranial nerve deficit. Motor/sens/dtr intact Skin: Skin is warm and dry. No rash noted.  Psychiatric:  Has  normal mood and affect. Behavior is normal.  Spine nontender       Assessment & Plan:

## 2010-09-12 NOTE — Patient Instructions (Signed)
Continue all other medications as before Please keep your appointments with your specialists as you have planned - Dr Catalina Antigua will be contacted regarding the referral for: Opthomology Please go to LAB in the Basement for the blood and/or urine tests to be done today Please call the phone number [phone] (the PhoneTree System) for results of testing in 2-3 days;  When calling, simply dial the number, and when prompted enter the MRN number above (the Medical Record Number) and the # key, then the message should start. Please return in 1 year for your yearly visit, or sooner if needed, with Lab testing done 3-5 days before

## 2010-09-12 NOTE — Assessment & Plan Note (Signed)
Per pt report from [place] optho , will ask for second opinion - refer optho

## 2010-09-13 ENCOUNTER — Encounter: Payer: Self-pay | Admitting: Internal Medicine

## 2010-09-13 LAB — RHEUMATOID FACTOR: Rhuematoid fact SerPl-aCnc: <10 IU/mL (ref <=14)

## 2010-09-13 NOTE — Assessment & Plan Note (Signed)

## 2010-09-15 LAB — ANA: Anti Nuclear Antibody(ANA): NEGATIVE

## 2010-09-15 NOTE — Telephone Encounter (Signed)
Called patient; left message to call back.

## 2010-09-16 NOTE — Telephone Encounter (Signed)
Called the patient and she has had no UTI symptoms

## 2010-09-16 NOTE — Telephone Encounter (Signed)
Noted, ok to close note 

## 2010-09-19 ENCOUNTER — Telehealth: Payer: Self-pay

## 2010-09-19 MED ORDER — CEPHALEXIN 500 MG PO CAPS: 500.0000 mg | ORAL_CAPSULE | Freq: Four times a day (QID) | ORAL | Status: AC

## 2010-09-19 NOTE — Telephone Encounter (Signed)
Pt called stating that although she denied sxs of UTI she is now experiencing dysuria and frequency with mild flank pain. Pt is requesting ABX to pharmacy on file.

## 2010-09-19 NOTE — Telephone Encounter (Signed)
Pt informed

## 2010-09-19 NOTE — Telephone Encounter (Signed)
Done per emr 

## 2010-11-25 ENCOUNTER — Other Ambulatory Visit: Payer: Self-pay | Admitting: Internal Medicine

## 2010-12-29 ENCOUNTER — Other Ambulatory Visit: Payer: Self-pay

## 2010-12-29 MED ORDER — CLONAZEPAM 0.5 MG PO TABS: 0.5000 mg | ORAL_TABLET | Freq: Two times a day (BID) | ORAL | Status: DC | PRN

## 2010-12-29 NOTE — Telephone Encounter (Signed)
Faxed hardcopy to [facility] 

## 2010-12-29 NOTE — Telephone Encounter (Signed)
Done hardcopy to [facility]  

## 2011-01-20 ENCOUNTER — Encounter: Payer: Self-pay | Admitting: Internal Medicine

## 2011-01-20 ENCOUNTER — Ambulatory Visit (INDEPENDENT_AMBULATORY_CARE_PROVIDER_SITE_OTHER): Payer: BC Managed Care – PPO | Admitting: Internal Medicine

## 2011-01-20 DIAGNOSIS — F419 Anxiety disorder, unspecified: Secondary | ICD-10-CM

## 2011-01-20 DIAGNOSIS — F411 Generalized anxiety disorder: Secondary | ICD-10-CM

## 2011-01-20 DIAGNOSIS — F329 Major depressive disorder, single episode, unspecified: Secondary | ICD-10-CM

## 2011-01-20 DIAGNOSIS — M51369 Other intervertebral disc degeneration, lumbar region without mention of lumbar back pain or lower extremity pain: Secondary | ICD-10-CM | POA: Insufficient documentation

## 2011-01-20 DIAGNOSIS — G43909 Migraine, unspecified, not intractable, without status migrainosus: Secondary | ICD-10-CM | POA: Insufficient documentation

## 2011-01-20 DIAGNOSIS — M47812 Spondylosis without myelopathy or radiculopathy, cervical region: Secondary | ICD-10-CM

## 2011-01-20 DIAGNOSIS — M5136 Other intervertebral disc degeneration, lumbar region: Secondary | ICD-10-CM

## 2011-01-20 DIAGNOSIS — F3289 Other specified depressive episodes: Secondary | ICD-10-CM

## 2011-01-20 HISTORY — DX: Other intervertebral disc degeneration, lumbar region: M51.36

## 2011-01-20 HISTORY — DX: Other intervertebral disc degeneration, lumbar region without mention of lumbar back pain or lower extremity pain: M51.369

## 2011-01-20 HISTORY — DX: Spondylosis without myelopathy or radiculopathy, cervical region: M47.812

## 2011-01-20 MED ORDER — ALMOTRIPTAN MALATE 12.5 MG PO TABS: 12.5000 mg | ORAL_TABLET | ORAL | Status: DC | PRN

## 2011-01-20 NOTE — Progress Notes (Signed)
Subjective:    Patient ID: Cassandra Gallagher, female    DOB: [birth year], 50 y.o.   MRN: [id number]  HPI  Here to f/u recurrent headaches for many many yrs, but now a bit more freq and severe, in that a severe HA will occur now every 4-6 wks that is debiliating with pain, blurred vision, nausea/vomiting and photohpobia, but also last almost 1- 3 days.  Has tried many meds in the past, but essentially been getting by on OTC meds until most recent for the past 15 yrs.  Has known chronic pain to the neck but not assoc with this.  Has tried imitrex but less effetvie and did not like the side effect, fiorinal, but zomig was the best for her, though she felt " a bit weird" but would take it again.  Pt denies chest pain, increased sob or doe, wheezing, orthopnea, PND, increased LE swelling, palpitations, dizziness or syncope.  Pt denies new neurological symptoms such as new headache, or facial or extremity weakness or numbness except for the above.  Pt denies fever, wt loss, night sweats, loss of appetite, or other constitutional symptoms  Other triggers for similar HA in the past have been strong sunlight, or overheated.  Denies worsening depressive symptoms, suicidal ideation, or panic, though has ongoing anxiety.  Last optic nerve eval in aug 2012 at [facility] op[facility] with "3 hrs tests" and told per pt that she has some unusual presentation but was not felt to actually have a signficant abnormality that required further eval or tx. No glaucoma, or other diagnosis, asked to f/u in 1 yr.  Did also have facet injections/EIS to the lower back per Dr Ethelene Hal recently that seemed to help. Past Medical History  Diagnosis Date  . DEPRESSIVE DISORDER 07/26/2007  . UTI 05/11/2007  . Lump or mass in breast 11/14/2008  . 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 Cervicalgia 07/26/2007  . BACK PAIN 07/26/2007  . Palpitations 09/21/2008  . Grave's disease     hx of Grave's Disease/Hyperthyroidism  . Hx of migraines   .  Anxiety 06/02/2010  . Insomnia 06/02/2010  . 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 Lumbar degenerative disc disease 01/20/2011   Past Surgical History  Procedure Date  . Breast biopsy     reports that she has quit smoking. She does not have any smokeless tobacco history on file. She reports that she does not drink alcohol or use illicit drugs. family history includes Hypertension in her mother. No Known Allergies Current Outpatient Prescriptions on File Prior to Visit  Medication Sig Dispense Refill  . clonazePAM (KLONOPIN) 0.5 MG tablet Take 1 tablet (0.5 mg total) by mouth 2 (two) times daily as needed.  60 tablet  5  . omeprazole (PRILOSEC) 20 MG capsule TAKE 1 CAPSULE EVERY DAY  90 capsule  1  . Calcium Carbonate (CALCIUM 500 PO) Take by mouth daily.        Marland Kitchen HYDROcodone-acetaminophen (NORCO) 5-325 MG per tablet Take 1 tablet by mouth every 6 (six) hours as needed.        . Naproxen Sodium (NAPRELAN) 375 MG TB24 2 (two) times daily.        . Prenatal Multivit-Min-Fe-FA (PRE-NATAL FORMULA) TABS Take by mouth daily.        Marland Kitchen triamcinolone (KENALOG) 0.1 % cream Apply topically 2 (two) times daily.  90 g  3     Review of Systems Review of Systems  Constitutional: Negative for diaphoresis and unexpected weight  change.  HENT: Negative for drooling and tinnitus.   Eyes: Negative for photophobia and visual disturbance.  Respiratory: Negative for choking and stridor.   Gastrointestinal: Negative for vomiting and blood in stool.  Genitourinary: Negative for hematuria and decreased urine volume.  Musculoskeletal: Negative for gait problem.  Skin: Negative for color change and wound.  Neurological: Negative for tremors and numbness.  Psychiatric/Behavioral: Negative for decreased concentration. The patient is not hyperactive.       Objective:   Physical Exam BP 100/62  Pulse 68  Temp(Src) 98.6 F (37 C) (Oral)  Ht 5\' 6"  (1.676 m)  Wt 148 lb 4 oz (67.246 kg)  BMI 23.93 kg/m2   SpO2 97%  LMP 10/11/2010 Physical Exam  VS noted Constitutional: Pt appears well-developed and well-nourished.  HENT: Head: Normocephalic.  Right Ear: External ear normal.  Left Ear: External ear normal.  Eyes: Conjunctivae and EOM are normal. Pupils are equal, round, and reactive to light.  Neck: Normal range of motion. Neck supple.  Cardiovascular: Normal rate and regular rhythm.   Pulmonary/Chest: Effort normal and breath sounds normal.  Abd:  Soft, NT, non-distended, + BS Neurological: Pt is alert. No cranial nerve deficit.motor/sens/dtr intact  Skin: Skin is warm. No erythema.  Psychiatric: Pt behavior is normal. Thought content normal. 1+ nervous, not depressed affect    Assessment & Plan:

## 2011-01-20 NOTE — Patient Instructions (Addendum)
Take all new medications as prescribed Continue all other medications as before Please return in August 2013 with Lab testing done 3-5 days before

## 2011-01-23 ENCOUNTER — Other Ambulatory Visit: Payer: Self-pay | Admitting: Internal Medicine

## 2011-01-23 DIAGNOSIS — Z1231 Encounter for screening mammogram for malignant neoplasm of breast: Secondary | ICD-10-CM

## 2011-01-25 ENCOUNTER — Encounter: Payer: Self-pay | Admitting: Internal Medicine

## 2011-01-25 NOTE — Assessment & Plan Note (Signed)
stable overall by hx and exam, most recent data reviewed with pt, and pt to continue medical treatment as before  Lab Results  Component Value Date   WBC 4.4* 09/12/2010   HGB 13.0 09/12/2010   HCT 39.2 09/12/2010   PLT 284.0 09/12/2010   GLUCOSE 97 09/12/2010   CHOL 188 09/12/2010   TRIG 94.0 09/12/2010   HDL 45.80 09/12/2010   LDLCALC 123* 09/12/2010   ALT 24 09/12/2010   AST 22 09/12/2010   NA 143 09/12/2010   K 3.9 09/12/2010   CL 105 09/12/2010   CREATININE 0.8 09/12/2010   BUN 18 09/12/2010   CO2 29 09/12/2010   TSH 0.71 09/12/2010

## 2011-01-25 NOTE — Assessment & Plan Note (Signed)
stable overall by hx and exam,  and pt to continue medical treatment as before, for med refills prn

## 2011-01-25 NOTE — Assessment & Plan Note (Signed)
For axert prn,  to f/u any worsening symptoms or concerns

## 2011-01-29 ENCOUNTER — Telehealth: Payer: Self-pay

## 2011-01-29 MED ORDER — RIZATRIPTAN BENZOATE 10 MG PO TABS: 10.0000 mg | ORAL_TABLET | ORAL | Status: DC | PRN

## 2011-01-29 NOTE — Telephone Encounter (Signed)
Pt must try and fail on Maxalt 10 mg and 20 mg. Rx changed per MD and sent to pharmacy. Must notify pt.

## 2011-01-30 NOTE — Telephone Encounter (Signed)
Left message on machine for pt to return my call  

## 2011-02-02 NOTE — Telephone Encounter (Signed)
Called the patient left message to call back 

## 2011-02-02 NOTE — Telephone Encounter (Signed)
Patient informed. She had already filled the Axert and paid cash. She has taken 4 and they have worked well with no side effects. She did pickup the Maxalt and took as well and had some side effects with that one. She stated both medications worked well for her headache, but Maxalt had side effects and Axert did not.

## 2011-02-02 NOTE — Telephone Encounter (Signed)
Ok for PA for the axert

## 2011-02-22 ENCOUNTER — Ambulatory Visit (INDEPENDENT_AMBULATORY_CARE_PROVIDER_SITE_OTHER): Payer: BC Managed Care – PPO

## 2011-02-22 DIAGNOSIS — Z23 Encounter for immunization: Secondary | ICD-10-CM

## 2011-02-24 ENCOUNTER — Ambulatory Visit: Payer: BC Managed Care – PPO

## 2011-03-18 ENCOUNTER — Ambulatory Visit: Payer: BC Managed Care – PPO

## 2011-04-09 ENCOUNTER — Encounter: Payer: Self-pay | Admitting: Internal Medicine

## 2011-04-09 ENCOUNTER — Ambulatory Visit (INDEPENDENT_AMBULATORY_CARE_PROVIDER_SITE_OTHER): Payer: BC Managed Care – PPO | Admitting: Internal Medicine

## 2011-04-09 VITALS — BP 102/70 | HR 81 | Temp 97.3°F | Ht 66.0 in | Wt 151.2 lb

## 2011-04-09 DIAGNOSIS — F411 Generalized anxiety disorder: Secondary | ICD-10-CM

## 2011-04-09 DIAGNOSIS — J069 Acute upper respiratory infection, unspecified: Secondary | ICD-10-CM | POA: Insufficient documentation

## 2011-04-09 DIAGNOSIS — H109 Unspecified conjunctivitis: Secondary | ICD-10-CM

## 2011-04-09 DIAGNOSIS — F419 Anxiety disorder, unspecified: Secondary | ICD-10-CM

## 2011-04-09 MED ORDER — TOBRAMYCIN 0.3 % OP SOLN: 1.0000 [drp] | Freq: Four times a day (QID) | OPHTHALMIC | Status: DC

## 2011-04-09 MED ORDER — AZITHROMYCIN 250 MG PO TABS: ORAL_TABLET | ORAL | Status: AC

## 2011-04-09 MED ORDER — TOBRAMYCIN 0.3 % OP SOLN: 1.0000 [drp] | Freq: Four times a day (QID) | OPHTHALMIC | Status: AC

## 2011-04-09 MED ORDER — AZITHROMYCIN 250 MG PO TABS: ORAL_TABLET | ORAL | Status: DC

## 2011-04-09 NOTE — Progress Notes (Signed)
Subjective:    Patient ID: Cassandra Gallagher, female    DOB: 08-26-1960, 51 y.o.   MRN: [id number]  HPI   Here with 3 days acute onset fever, facial pain, pressure, general weakness and malaise, and greenish d/c, with slight ST, but little to no cough and Pt denies chest pain, increased sob or doe, wheezing, orthopnea, PND, increased LE swelling, palpitations, dizziness or syncope.  Also with bilat eye involvement it seems with puffy, red, matting and d/c.  Pt denies new neurological symptoms such as new headache, or facial or extremity weakness or numbness   Pt denies polydipsia, polyuria.  Denies worsening depressive symptoms, suicidal ideation, or panic, though has ongoing anxiety, not increased recently.   Past Medical History  Diagnosis Date  . DEPRESSIVE DISORDER 07/26/2007  . UTI 05/11/2007  . Lump or mass in breast 11/14/2008  . 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 Cervicalgia 07/26/2007  . BACK PAIN 07/26/2007  . Palpitations 09/21/2008  . Grave's disease     hx of Grave's Disease/Hyperthyroidism  . Hx of migraines   . Anxiety 06/02/2010  . Insomnia 06/02/2010  . 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 Lumbar degenerative disc disease 01/20/2011   Past Surgical History  Procedure Date  . Breast biopsy     reports that she has quit smoking. She does not have any smokeless tobacco history on file. She reports that she does not drink alcohol or use illicit drugs. family history includes Hypertension in her mother. No Known Allergies Current Outpatient Prescriptions on File Prior to Visit  Medication Sig Dispense Refill  . Calcium Carbonate (CALCIUM 500 PO) Take by mouth daily.        . clonazePAM (KLONOPIN) 0.5 MG tablet Take 1 tablet (0.5 mg total) by mouth 2 (two) times daily as needed.  60 tablet  5  . HYDROcodone-acetaminophen (NORCO) 5-325 MG per tablet Take 1 tablet by mouth every 6 (six) hours as needed.        . Naproxen Sodium (NAPRELAN) 375 MG TB24 2 (two)  times daily.        Marland Kitchen omeprazole (PRILOSEC) 20 MG capsule TAKE 1 CAPSULE EVERY DAY  90 capsule  1  . almotriptan (AXERT) 12.5 MG tablet Take 1 tablet (12.5 mg total) by mouth as needed for migraine. may repeat in 2 hours if needed  10 tablet  2   Review of Systems Review of Systems  Constitutional: Negative for diaphoresis and unexpected weight change.  HENT: Negative for drooling and tinnitus.   Eyes: Negative for photophobia and visual disturbance.  Respiratory: Negative for choking and stridor.   Gastrointestinal: Negative for vomiting and blood in stool.  Genitourinary: Negative for hematuria and decreased urine volume.    Objective:   Physical Exam BP 102/70  Pulse 81  Temp(Src) 97.3 F (36.3 C) (Oral)  Ht 5\' 6"  (1.676 m)  Wt 151 lb 4 oz (68.607 kg)  BMI 24.41 kg/m2  SpO2 97% Physical Exam  VS noted, mild ill Constitutional: Pt appears well-developed and well-nourished.  HENT: Head: Normocephalic.  Right Ear: External ear normal.  Left Ear: External ear normal.  Bilat conjunt with moderate erythema, mild d/c Bilat tm's mild erythema.  Sinus tender bilat.  Pharynx mild erythema Eyes: l. Pupils are equal, round, and reactive to light.  Neck: Normal range of motion. Neck supple.  Cardiovascular: Normal rate and regular rhythm.   Pulmonary/Chest: Effort normal and breath sounds normal.  Neurological: Pt is alert. Skin: Skin is  warm. No erythema.  Psychiatric: Pt behavior is normal. Thought content normal. not overly nervous or depressed affect    Assessment & Plan:

## 2011-04-09 NOTE — Patient Instructions (Signed)
Take all new medications as prescribed Continue all other medications as before  

## 2011-04-09 NOTE — Assessment & Plan Note (Signed)
stable overall by hx and exam, most recent data reviewed with pt, and pt to continue medical treatment as before  Lab Results  Component Value Date   WBC 4.4* 09/12/2010   HGB 13.0 09/12/2010   HCT 39.2 09/12/2010   PLT 284.0 09/12/2010   GLUCOSE 97 09/12/2010   CHOL 188 09/12/2010   TRIG 94.0 09/12/2010   HDL 45.80 09/12/2010   LDLCALC 123* 09/12/2010   ALT 24 09/12/2010   AST 22 09/12/2010   NA 143 09/12/2010   K 3.9 09/12/2010   CL 105 09/12/2010   CREATININE 0.8 09/12/2010   BUN 18 09/12/2010   CO2 29 09/12/2010   TSH 0.71 09/12/2010    

## 2011-04-09 NOTE — Assessment & Plan Note (Signed)
Mild to mod, for antibx course,  to f/u any worsening symptoms or concerns 

## 2011-05-16 ENCOUNTER — Other Ambulatory Visit: Payer: Self-pay | Admitting: Internal Medicine

## 2011-06-15 ENCOUNTER — Ambulatory Visit: Payer: BC Managed Care – PPO | Admitting: Internal Medicine

## 2011-06-15 DIAGNOSIS — Z0289 Encounter for other administrative examinations: Secondary | ICD-10-CM

## 2011-07-01 ENCOUNTER — Telehealth: Payer: Self-pay

## 2011-07-01 MED ORDER — CLONAZEPAM 0.5 MG PO TABS: 0.5000 mg | ORAL_TABLET | Freq: Two times a day (BID) | ORAL | Status: DC | PRN

## 2011-07-01 MED ORDER — RIZATRIPTAN BENZOATE 10 MG PO TABS: 10.0000 mg | ORAL_TABLET | ORAL | Status: DC | PRN

## 2011-07-01 NOTE — Telephone Encounter (Signed)
Called the patient informed prescriptions have been filled and faxed hardcopy to pharmacy

## 2011-07-01 NOTE — Telephone Encounter (Signed)
The patient just refill her clonazepam last week, but is going out of the country on 07/08/11 and not returning until 08/25/11.  She would like to have a refill to take with her on her trip as current bottle will not cover her for her vacation. Also would like maxalt as well refilled.

## 2011-07-01 NOTE — Telephone Encounter (Signed)
i printed 

## 2011-08-27 ENCOUNTER — Ambulatory Visit: Payer: BC Managed Care – PPO | Admitting: Internal Medicine

## 2011-08-27 DIAGNOSIS — Z0289 Encounter for other administrative examinations: Secondary | ICD-10-CM

## 2011-09-08 ENCOUNTER — Encounter: Payer: Self-pay | Admitting: Internal Medicine

## 2011-09-08 ENCOUNTER — Ambulatory Visit (INDEPENDENT_AMBULATORY_CARE_PROVIDER_SITE_OTHER): Payer: BC Managed Care – PPO | Admitting: Internal Medicine

## 2011-09-08 VITALS — BP 132/80 | HR 64 | Temp 97.8°F | Ht 66.0 in | Wt 143.0 lb

## 2011-09-08 DIAGNOSIS — F3289 Other specified depressive episodes: Secondary | ICD-10-CM

## 2011-09-08 DIAGNOSIS — F329 Major depressive disorder, single episode, unspecified: Secondary | ICD-10-CM

## 2011-09-08 DIAGNOSIS — F419 Anxiety disorder, unspecified: Secondary | ICD-10-CM

## 2011-09-08 DIAGNOSIS — G8929 Other chronic pain: Secondary | ICD-10-CM

## 2011-09-08 DIAGNOSIS — F411 Generalized anxiety disorder: Secondary | ICD-10-CM

## 2011-09-08 MED ORDER — CYCLOBENZAPRINE HCL 5 MG PO TABS: 5.0000 mg | ORAL_TABLET | Freq: Three times a day (TID) | ORAL | Status: DC | PRN

## 2011-09-08 MED ORDER — OXYCODONE HCL 10 MG PO TB12: 10.0000 mg | ORAL_TABLET | Freq: Two times a day (BID) | ORAL | Status: DC | PRN

## 2011-09-08 MED ORDER — CYCLOBENZAPRINE HCL 5 MG PO TABS: 5.0000 mg | ORAL_TABLET | Freq: Three times a day (TID) | ORAL | Status: AC | PRN

## 2011-09-08 NOTE — Progress Notes (Signed)
Subjective:    Patient ID: Cassandra Gallagher, female    DOB: 10/10/1960, 51 y.o.   MRN: [id number]  HPI  Here with chronic back pain, unhappy with Dr Ethelene Hal as she feels desptie the high charge he does not want to listen fully to her concerns, and only variable success with several injections, and current hydrocodone 10/325 qid not lasting long enough - only 3.5 hrs per dose;  When she asked for change in med pt reports she was asked to see a "pain psychiatrist" to help with coping.  Every summer she goes to [place], and recently saw a GP there, who more successfully tx here with Targelin bid (oxycodone/naloxone ER 10/5 mg bid) and Katadolon and "genuinely felt like I had my life back."  Unfortunately neither of those meds are available in the [place].  Pt continues to have recurring LBP without change in severity, bowel or bladder change, fever, wt loss,  worsening LE pain/numbness/weakness, gait change or falls.  Pt denies chest pain, increased sob or doe, wheezing, orthopnea, PND, increased LE swelling, palpitations, dizziness or syncope.    Denies worsening depressive symptoms, suicidal ideation, or panic, though has ongoing anxiety, Last MRI approx 1 yr, results not avail today Past Medical History  Diagnosis Date  . DEPRESSIVE DISORDER 07/26/2007  . UTI 05/11/2007  . Lump or mass in breast 11/14/2008  . 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 Cervicalgia 07/26/2007  . BACK PAIN 07/26/2007  . Palpitations 09/21/2008  . Grave's disease     hx of Grave's Disease/Hyperthyroidism  . Hx of migraines   . Anxiety 06/02/2010  . Insomnia 06/02/2010  . 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 Lumbar degenerative disc disease 01/20/2011   Past Surgical History  Procedure Date  . Breast biopsy     reports that she has quit smoking. She does not have any smokeless tobacco history on file. She reports that she does not drink alcohol or use illicit drugs. family history includes Hypertension in her  mother. No Known Allergies Current Outpatient Prescriptions on File Prior to Visit  Medication Sig Dispense Refill  . almotriptan (AXERT) 12.5 MG tablet Take 1 tablet (12.5 mg total) by mouth as needed for migraine. may repeat in 2 hours if needed  10 tablet  2  . clonazePAM (KLONOPIN) 0.5 MG tablet Take 1 tablet (0.5 mg total) by mouth 2 (two) times daily as needed.  60 tablet  5  . Naproxen Sodium (NAPRELAN) 375 MG TB24 2 (two) times daily.        . rizatriptan (MAXALT) 10 MG tablet Take 1 tablet (10 mg total) by mouth as needed for migraine. May repeat in 2 hours if needed  10 tablet  2  . Calcium Carbonate (CALCIUM 500 PO) Take by mouth daily.        Marland Kitchen HYDROcodone-acetaminophen (NORCO) 5-325 MG per tablet Take 1 tablet by mouth every 6 (six) hours as needed.        Marland Kitchen omeprazole (PRILOSEC) 20 MG capsule TAKE 1 CAPSULE EVERY DAY  90 capsule  1   Review of Systems All otherwise neg per pt     Objective:   Physical Exam BP 132/80  Pulse 64  Temp 97.8 F (36.6 C) (Oral)  Ht 5\' 6"  (1.676 m)  Wt 143 lb (64.864 kg)  BMI 23.08 kg/m2  SpO2 99% Physical Exam  VS noted Constitutional: Pt appears well-developed and well-nourished.  HENT: Head: Normocephalic.  Right Ear: External ear normal.  Left  Ear: External ear normal.  Eyes: Conjunctivae and EOM are normal. Pupils are equal, round, and reactive to light.  Neck: Normal range of motion. Neck supple.  Cardiovascular: Normal rate and regular rhythm.   Pulmonary/Chest: Effort normal and breath sounds normal.  Spine; tender at lowest lumbar  Neurological: Pt is alert. Motor/gait intact.  Skin: Skin is warm. No erythema. No edema Psychiatric: 1+ nervous, 1+ dysphoric    Assessment & Plan:

## 2011-09-08 NOTE — Assessment & Plan Note (Signed)
stable overall by hx and exam, most recent data reviewed with pt, and pt to continue medical treatment as before  Lab Results  Component Value Date   WBC 4.4* 09/12/2010   HGB 13.0 09/12/2010   HCT 39.2 09/12/2010   PLT 284.0 09/12/2010   GLUCOSE 97 09/12/2010   CHOL 188 09/12/2010   TRIG 94.0 09/12/2010   HDL 45.80 09/12/2010   LDLCALC 123* 09/12/2010   ALT 24 09/12/2010   AST 22 09/12/2010   NA 143 09/12/2010   K 3.9 09/12/2010   CL 105 09/12/2010   CREATININE 0.8 09/12/2010   BUN 18 09/12/2010   CO2 29 09/12/2010   TSH 0.71 09/12/2010    

## 2011-09-08 NOTE — Assessment & Plan Note (Addendum)
targelin and Katdolon muscle relaxer not avail in [place]; she is dissapointed;  Will tx for now with oxycontin 10 bid, and flexeril prn, and refer pain clinic

## 2011-09-08 NOTE — Patient Instructions (Addendum)
Please fax your last MRI result to [phone] Take all new medications as prescribed Continue all other medications as before You will be contacted regarding the referral for: pain clinic Please return in 3 mo with Lab testing done 3-5 days before

## 2011-09-08 NOTE — Assessment & Plan Note (Signed)
.  stable overall by hx and exam,, and pt to continue medical treatment as before, declines further tx or eval, nonsuicidal

## 2011-09-28 ENCOUNTER — Other Ambulatory Visit: Payer: Self-pay

## 2011-09-28 MED ORDER — OXYCODONE HCL 10 MG PO TB12: 10.0000 mg | ORAL_TABLET | Freq: Three times a day (TID) | ORAL | Status: DC | PRN

## 2011-09-28 MED ORDER — RIZATRIPTAN BENZOATE 10 MG PO TABS: 10.0000 mg | ORAL_TABLET | ORAL | Status: DC | PRN

## 2011-09-28 NOTE — Telephone Encounter (Signed)
Pt called stating that Flexeril was not working as well as her previous muscle relaxer and because of this she had to take Oxycodone TID instead of as prescribed - BID. Pt is now out of medication and requesting an early refill. Pt was advised that she should have contacted office regarding muscle relaxer rather than increasing pain medication since [facility] does not authorize early refills, especially 10 days early. Pt asked that early refill request still be mad, please advise.

## 2011-09-28 NOTE — Telephone Encounter (Signed)
Called the patient informed rx is ready for pickup at the front desk. 

## 2011-09-28 NOTE — Telephone Encounter (Addendum)
Ok this time only; pt has been referred to pain clinic at last visit, Done hardcopy to [place]

## 2011-09-28 NOTE — Addendum Note (Signed)
Addended by: Corwin Levins on: 09/28/2011 12:41 PM   Modules accepted: Orders

## 2011-10-26 ENCOUNTER — Ambulatory Visit (INDEPENDENT_AMBULATORY_CARE_PROVIDER_SITE_OTHER): Payer: BC Managed Care – PPO | Admitting: Internal Medicine

## 2011-10-26 ENCOUNTER — Encounter: Payer: Self-pay | Admitting: Internal Medicine

## 2011-10-26 ENCOUNTER — Encounter: Payer: Self-pay | Admitting: Physical Medicine & Rehabilitation

## 2011-10-26 VITALS — BP 112/70 | HR 95 | Temp 98.3°F | Ht 66.0 in | Wt 145.5 lb

## 2011-10-26 DIAGNOSIS — Z23 Encounter for immunization: Secondary | ICD-10-CM

## 2011-10-26 DIAGNOSIS — G8929 Other chronic pain: Secondary | ICD-10-CM

## 2011-10-26 DIAGNOSIS — Z0000 Encounter for general adult medical examination without abnormal findings: Secondary | ICD-10-CM

## 2011-10-26 MED ORDER — OXYCODONE HCL 10 MG PO TB12: 10.0000 mg | ORAL_TABLET | Freq: Two times a day (BID) | ORAL | Status: DC | PRN

## 2011-10-26 MED ORDER — OXYCODONE HCL 10 MG PO TB12: 10.0000 mg | ORAL_TABLET | Freq: Three times a day (TID) | ORAL | Status: DC | PRN

## 2011-10-26 MED ORDER — HYDROCODONE-ACETAMINOPHEN 5-325 MG PO TABS: 1.0000 | ORAL_TABLET | Freq: Every day | ORAL | Status: DC | PRN

## 2011-10-26 NOTE — Patient Instructions (Addendum)
You had the tetanus shot today, and the flu shot Your oxycontin is changed to twice per day, with the hydrocodone once per day as needed You will be contacted regarding the referral for: pain clinic Continue all other medications as before Please keep your appointments with your specialists as you have planned - GYN yearly  OK to cancel the oct 2013 appt  Please return in 1 year for your yearly visit, or sooner if needed, with Lab testing done 3-5 days before

## 2011-10-26 NOTE — Progress Notes (Signed)
Subjective:    Patient ID: Cassandra Gallagher, female    DOB: [birth year], 51 y.o.   MRN: [id number]  HPI  Here for wellness and f/u;  Overall doing ok;  Pt denies CP, worsening SOB, DOE, wheezing, orthopnea, PND, worsening LE edema, palpitations, dizziness or syncope.  Pt denies neurological change such as new Headache, facial or extremity weakness.  Pt denies polydipsia, polyuria, or low sugar symptoms. Pt states overall good compliance with treatment and medications, good tolerability, and trying to follow lower cholesterol diet.  Pt denies worsening depressive symptoms, suicidal ideation or panic. No fever, wt loss, night sweats, loss of appetite, or other constitutional symptoms.  Pt states good ability with ADL's, low fall risk, home safety reviewed and adequate, no significant changes in hearing or vision, and occasionally active with exercise.  Overall pain ok, but flexeril did not help; asks to change back to oxycontin 10 bid (from tid), with hydrocodone 10/325 qd prn breakthrough pain. Will get flu shot next wk per her kids pediatrician as she normally does;     Pt denies fever, wt loss, night sweats, loss of appetite, or other constitutional symptoms Past Medical History  Diagnosis Date  . DEPRESSIVE DISORDER 07/26/2007  . UTI 05/11/2007  . Lump or mass in breast 11/14/2008  . 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 Cervicalgia 07/26/2007  . BACK PAIN 07/26/2007  . Palpitations 09/21/2008  . Grave's disease     hx of Grave's Disease/Hyperthyroidism  . Hx of migraines   . Anxiety 06/02/2010  . Insomnia 06/02/2010  . 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 Lumbar degenerative disc disease 01/20/2011   Past Surgical History  Procedure Date  . Breast biopsy     reports that she has quit smoking. She does not have any smokeless tobacco history on file. She reports that she does not drink alcohol or use illicit drugs. family history includes Hypertension in her mother. No Known  Allergies Current Outpatient Prescriptions on File Prior to Visit  Medication Sig Dispense Refill  . almotriptan (AXERT) 12.5 MG tablet Take 1 tablet (12.5 mg total) by mouth as needed for migraine. may repeat in 2 hours if needed  10 tablet  2  . Calcium Carbonate (CALCIUM 500 PO) Take by mouth daily.        . clonazePAM (KLONOPIN) 0.5 MG tablet Take 1 tablet (0.5 mg total) by mouth 2 (two) times daily as needed.  60 tablet  5  . Naproxen Sodium (NAPRELAN) 375 MG TB24 2 (two) times daily.        Marland Kitchen omeprazole (PRILOSEC) 20 MG capsule TAKE 1 CAPSULE EVERY DAY  90 capsule  1  . rizatriptan (MAXALT) 10 MG tablet Take 1 tablet (10 mg total) by mouth as needed for migraine. May repeat in 2 hours if needed  10 tablet  2   Review of Systems Review of Systems  Constitutional: Negative for diaphoresis, activity change, appetite change and unexpected weight change.  HENT: Negative for hearing loss, ear pain, facial swelling, mouth sores and neck stiffness.   Eyes: Negative for pain, redness and visual disturbance.  Respiratory: Negative for shortness of breath and wheezing.   Cardiovascular: Negative for chest pain and palpitations.  Gastrointestinal: Negative for diarrhea, blood in stool, abdominal distention and rectal pain.  Genitourinary: Negative for hematuria, flank pain and decreased urine volume.  Musculoskeletal: Negative for myalgias and joint swelling.  Skin: Negative for color change and wound.  Neurological: Negative for syncope and numbness.  Hematological: Negative for adenopathy.  Psychiatric/Behavioral: Negative for hallucinations, self-injury, decreased concentration and agitation.     Objective:   Physical Exam BP 112/70  Pulse 95  Temp 98.3 F (36.8 C) (Oral)  Ht 5\' 6"  (1.676 m)  Wt 145 lb 8 oz (65.998 kg)  BMI 23.48 kg/m2  SpO2 98% Physical Exam  VS noted Constitutional: Pt is oriented to person, place, and time. Appears well-developed and well-nourished.  HENT:    Head: Normocephalic and atraumatic.  Right Ear: External ear normal.  Left Ear: External ear normal.  Nose: Nose normal.  Mouth/Throat: Oropharynx is clear and moist.  Eyes: Conjunctivae and EOM are normal. Pupils are equal, round, and reactive to light.  Neck: Normal range of motion. Neck supple. No JVD present. No tracheal deviation present.  Cardiovascular: Normal rate, regular rhythm, normal heart sounds and intact distal pulses.   Pulmonary/Chest: Effort normal and breath sounds normal.  Abdominal: Soft. Bowel sounds are normal. There is no tenderness.  Musculoskeletal: Normal range of motion. Exhibits no edema.  Lymphadenopathy:  Has no cervical adenopathy.  Neurological: Pt is alert and oriented to person, place, and time. Pt has normal reflexes. No cranial nerve deficit. Motor/dtr/gait intact Diffuse lumbar tender, mostly to low mid lumbar Skin: Skin is warm and dry. No rash noted.  Psychiatric:  Has  normal mood and affect. Behavior is normal.     Assessment & Plan:

## 2011-10-26 NOTE — Assessment & Plan Note (Signed)

## 2011-10-26 NOTE — Assessment & Plan Note (Signed)
To change to oxycontin 10 bid, and hydrocodone 1 qd prn,  to f/u any worsening symptoms or concerns

## 2011-10-30 ENCOUNTER — Encounter: Payer: Self-pay | Admitting: Physical Medicine & Rehabilitation

## 2011-10-30 ENCOUNTER — Ambulatory Visit: Payer: BC Managed Care – PPO | Admitting: Physical Medicine & Rehabilitation

## 2011-10-30 ENCOUNTER — Ambulatory Visit (HOSPITAL_BASED_OUTPATIENT_CLINIC_OR_DEPARTMENT_OTHER): Payer: BC Managed Care – PPO | Admitting: Physical Medicine & Rehabilitation

## 2011-10-30 ENCOUNTER — Encounter: Payer: BC Managed Care – PPO | Attending: Physical Medicine & Rehabilitation

## 2011-10-30 VITALS — BP 134/79 | HR 92 | Resp 16 | Ht 65.0 in | Wt 145.0 lb

## 2011-10-30 DIAGNOSIS — M47816 Spondylosis without myelopathy or radiculopathy, lumbar region: Secondary | ICD-10-CM

## 2011-10-30 DIAGNOSIS — M47812 Spondylosis without myelopathy or radiculopathy, cervical region: Secondary | ICD-10-CM | POA: Insufficient documentation

## 2011-10-30 DIAGNOSIS — M5380 Other specified dorsopathies, site unspecified: Secondary | ICD-10-CM

## 2011-10-30 DIAGNOSIS — Z5181 Encounter for therapeutic drug level monitoring: Secondary | ICD-10-CM

## 2011-10-30 DIAGNOSIS — G894 Chronic pain syndrome: Secondary | ICD-10-CM | POA: Insufficient documentation

## 2011-10-30 DIAGNOSIS — M542 Cervicalgia: Secondary | ICD-10-CM | POA: Insufficient documentation

## 2011-10-30 DIAGNOSIS — M5030 Other cervical disc degeneration, unspecified cervical region: Secondary | ICD-10-CM | POA: Insufficient documentation

## 2011-10-30 MED ORDER — NORTRIPTYLINE HCL 10 MG PO CAPS: 10.0000 mg | ORAL_CAPSULE | Freq: Every day | ORAL | Status: DC

## 2011-10-30 MED ORDER — DIAZEPAM 10 MG PO TABS: 10.0000 mg | ORAL_TABLET | Freq: Once | ORAL | Status: DC

## 2011-10-30 NOTE — Progress Notes (Signed)
Subjective:    Patient ID: Cassandra Gallagher, female    DOB: 1960-11-10, 51 y.o.   MRN: [id number]  HPI  20 old female with a several year history of neck and back pain. She was treated previously by physical medicine rehabilitation. MRI of the cervical spine dated 07/22/2009 showed mild disc degeneration at C3-4 mild anterolisthesis C4 on C5 with mild facet arthrosis slight disc bulge at C5-6 and facet arthrosis on the right at C6-7 and C7-T1. There is no evidence of foraminal stenosis or central stenosis. Her lumbar spine dated 07/08/2010 demonstrated minimal disc bulges at L1-S1. No central or foraminal stenosis. Annular tear at L3-L4 She had good results with her back pain after bilateral L4-5 and L5-S1 facet injections performed on 10/08/2010. The patient took a trip to [place] this summer saw a physician there for her back and neck pain. She was started on a medication that combined Delayed-release oxycodone and naloxone and was given another medication to use at night which she described as a muscle relaxer combined with a mild analgesic.These medications are not available in the [place] . She returned home and saw her primary physician who put her on OxyContin CR 10 mg twice a day and hydrocodone to be used at night. The patient states that one of her main complaints is neck pain waking her up at 2 or 3 AM. This progresses to a migraine. She has used up too many of her Maxalt for the month. In the past she states she's had results with cervical epidural injections. I reviewed records from Dr. Ethelene Hal has had cervical epidurals at C7-T1 Despite all her pain complaints she has continued biking and continued working 40 hours a week Pain Inventory Average Pain 9 Pain Right Now 2 My pain is intermittent, constant, burning, dull, tingling and aching  In the last 24 hours, has pain interfered with the following? General activity 9 Relation with others 9 Enjoyment of life 9 What TIME of day  is your pain at its worst? evening Sleep (in general) Poor  Pain is worse with: unsure Pain improves with: medication and injections Relief from Meds: 8  Mobility ability to climb steps?  yes do you drive?  yes  Function employed # of hrs/week 40  Neuro/Psych weakness numbness tingling trouble walking  Prior Studies CT/MRI  Physicians involved in your care Primary care    Family History  Problem Relation Age of Onset  . Hypertension Mother    History   Social History  . Marital Status: Married    Spouse Name: N/A    Number of Children: 2  . Years of Education: N/A   Occupational History  . Production designer, theatre/television/film   Social History Main Topics  . Smoking status: Former Games developer  . Smokeless tobacco: None   Comment: smoked for 6 years, quit 15 years ago  . Alcohol Use: No  . Drug Use: No  . Sexually Active: None   Other Topics Concern  . None   Social History Narrative   2nd child born recently in September 2010.Married, one child from previous marriage lives in [place]   Past Surgical History  Procedure Date  . Breast biopsy    Past Medical History  Diagnosis Date  . DEPRESSIVE DISORDER 07/26/2007  . UTI 05/11/2007  . Lump or mass in breast 11/14/2008  . 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 Cervicalgia 07/26/2007  . BACK PAIN 07/26/2007  . Palpitations 09/21/2008  . Grave's disease  hx of Grave's Disease/Hyperthyroidism  . Hx of migraines   . Anxiety 06/02/2010  . Insomnia 06/02/2010  . 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 Lumbar degenerative disc disease 01/20/2011   BP 134/79  Pulse 92  Resp 16  Ht 5\' 5"  (1.651 m)  Wt 145 lb (65.772 kg)  BMI 24.13 kg/m2  SpO2 97%      Review of Systems  HENT: Negative.   Eyes: Negative.   Respiratory: Negative.   Cardiovascular: Negative.   Gastrointestinal: Negative.   Genitourinary: Negative.   Musculoskeletal: Positive for back pain and gait problem.  Skin: Negative.     Neurological: Positive for weakness and numbness.  Hematological: Negative.   Psychiatric/Behavioral: Negative.        Objective:   Physical Exam  Constitutional: She is oriented to person, place, and time. She appears well-developed and well-nourished.  HENT:  Head: Normocephalic and atraumatic.  Neck: Normal range of motion.  Musculoskeletal: Normal range of motion.       Cervical back: She exhibits pain.       Lumbar back: Normal.       Pain with cervical extension  Neurological: She is alert and oriented to person, place, and time. She has normal strength and normal reflexes. No sensory deficit. Gait normal.          Assessment & Plan:  1. Cervicalgia. She has mild cervical degenerative disc but also has cervical spondylosis. This seems to be her primary complaint. This is keeping her up at night. I will start low-dose tricyclic. Set her up with cervical medial branch blocks. I explained that since she has no radicular symptoms I do not think that a cervical epidural is indicated.  2. Lumbar facet syndrome. Currently well controlled on oral medications.  3. Chronic pain syndrome MRI of findings are not impressive for any significant structural lesions. Nonnarcotic alternatives include tramadol. A medication that would more likely mimic the medicine she received in [place] would be Embeda which combines morphine and naltrexone. Another treatment  would be Butrans patch.

## 2011-10-30 NOTE — Patient Instructions (Signed)
You will start on nortriptyline to help with chronic pain including neck pain and it may help prevent the migraines Talk to your primary care physician regarding the Maxalt We will do cervical facet injections next visit You will take Valium 10 mg prior to the injection

## 2011-11-03 ENCOUNTER — Telehealth: Payer: Self-pay | Admitting: Physical Medicine & Rehabilitation

## 2011-11-03 NOTE — Telephone Encounter (Signed)
Dr Kirtland Bouchard,  Clarifying order for "cervial" MBB.  Thanks.

## 2011-11-05 NOTE — Telephone Encounter (Signed)
Cervical MBB with me

## 2011-11-17 ENCOUNTER — Encounter: Payer: BC Managed Care – PPO | Attending: Physical Medicine & Rehabilitation

## 2011-11-17 ENCOUNTER — Encounter: Payer: Self-pay | Admitting: Physical Medicine & Rehabilitation

## 2011-11-17 ENCOUNTER — Ambulatory Visit (HOSPITAL_BASED_OUTPATIENT_CLINIC_OR_DEPARTMENT_OTHER): Payer: BC Managed Care – PPO | Admitting: Physical Medicine & Rehabilitation

## 2011-11-17 VITALS — BP 106/84 | HR 92 | Resp 14 | Ht 65.0 in | Wt 145.0 lb

## 2011-11-17 DIAGNOSIS — M5380 Other specified dorsopathies, site unspecified: Secondary | ICD-10-CM | POA: Insufficient documentation

## 2011-11-17 DIAGNOSIS — M5030 Other cervical disc degeneration, unspecified cervical region: Secondary | ICD-10-CM | POA: Insufficient documentation

## 2011-11-17 DIAGNOSIS — M542 Cervicalgia: Secondary | ICD-10-CM | POA: Insufficient documentation

## 2011-11-17 DIAGNOSIS — M47812 Spondylosis without myelopathy or radiculopathy, cervical region: Secondary | ICD-10-CM

## 2011-11-17 DIAGNOSIS — G894 Chronic pain syndrome: Secondary | ICD-10-CM | POA: Insufficient documentation

## 2011-11-17 MED ORDER — HYDROCODONE-ACETAMINOPHEN 7.5-325 MG PO TABS: 1.0000 | ORAL_TABLET | ORAL | Status: DC

## 2011-11-17 NOTE — Progress Notes (Signed)
  PROCEDURE RECORD [facility]   Name: Cassandra Gallagher DOB:September 17, 1960 MRN: [id number]  Date:11/17/2011  Physician: Claudette Laws, MD    Nurse/CMA: Shumaker RN  Allergies: No Known Allergies  Consent Signed: yes  Is patient diabetic? no  CBG today?   Pregnant: no LMP: Patient's last menstrual period was 10/18/2011. (age [age])  Anticoagulants: no Anti-inflammatory: yes (naproxen and ibuprofen prn)  Took 4 200mg  Advil this morning.  Takes 4-5 at a time as needed. Antibiotics: no  Procedure: cervical medial branch blocks c4-5-6 right Position: Left Lateral Start Time: 1:16 End Time: 1:24  Fluoro Time:14 sec  RN/CMA Designer, multimedia    Time 12:44 1:28    BP 106/84 128/71    Pulse 92 99    Respirations 14 14    O2 Sat 96% 99    S/S 6 6    Pain Level 2/10 unsure     D/C home with husband, [name] & O X 3, D/C instructions reviewed, and sits independently.

## 2011-11-17 NOTE — Patient Instructions (Signed)
You had a C4-C5-C6 right medial branch block. I will see you back in 2 or 3 weeks with a lumbar facet injections. We will see how you're neck is doing at that time as well. Please discuss your pain medications when you see Dr. Jonny Ruiz again. I have given you a prescription for 10 hydrocodone that you can take at night. It is the 7.5 mg dose.

## 2011-11-17 NOTE — Progress Notes (Signed)
Right C3-4-5 medial branch block under fluoroscopic guidance Indication is cervical spondylosis with pain during extension of the cervical spine as well as pain with palpation along the lateral neck. Informed consent was obtained after describing risks and benefits of the procedure with the patient. She gets only partial relief with narcotic analgesic medications. Her pain does interfere with sleep Patient placed in a left lateral decubitus position lateral neck was marked and prepped with Betadine skin wheal raised with 0.5 mL of 1% lidocaine using a 27-gauge 1/2 inch needle at each of 3 sites. Then a 25-gauge 1.5 inch needle was inserted under fluoroscopic guidance targeting the midpoint of the articular pillar first at C4 and C5 and C6. Once bone contact was made AP images confirmed proper location. Then 0.3 mL of 1% MPF lidocaine was injected into each of 3 sites. Patient tolerated procedure well. Return to clinic in 3 weeks for lumbar medial branch blocks

## 2011-11-20 ENCOUNTER — Telehealth: Payer: Self-pay | Admitting: Internal Medicine

## 2011-11-20 NOTE — Telephone Encounter (Signed)
The patient called the triage line and I was unable to hear exactly what she needed.  I tried calling the patient back at [phone], but there was no answer.  Lvmom for pt to clarify what she needs.

## 2011-11-20 NOTE — Telephone Encounter (Signed)
Called [facility] to call back.

## 2011-11-23 ENCOUNTER — Ambulatory Visit (INDEPENDENT_AMBULATORY_CARE_PROVIDER_SITE_OTHER): Payer: BC Managed Care – PPO | Admitting: Internal Medicine

## 2011-11-23 ENCOUNTER — Encounter: Payer: Self-pay | Admitting: Internal Medicine

## 2011-11-23 VITALS — BP 110/80 | HR 90 | Temp 97.7°F | Ht 66.0 in | Wt 145.4 lb

## 2011-11-23 DIAGNOSIS — F3289 Other specified depressive episodes: Secondary | ICD-10-CM

## 2011-11-23 DIAGNOSIS — G43909 Migraine, unspecified, not intractable, without status migrainosus: Secondary | ICD-10-CM

## 2011-11-23 DIAGNOSIS — F329 Major depressive disorder, single episode, unspecified: Secondary | ICD-10-CM

## 2011-11-23 DIAGNOSIS — G8929 Other chronic pain: Secondary | ICD-10-CM

## 2011-11-23 MED ORDER — OXYCODONE HCL 10 MG PO TB12: 10.0000 mg | ORAL_TABLET | Freq: Two times a day (BID) | ORAL | Status: DC | PRN

## 2011-11-23 MED ORDER — HYDROCODONE-ACETAMINOPHEN 7.5-325 MG PO TABS: 1.0000 | ORAL_TABLET | ORAL | Status: DC

## 2011-11-23 MED ORDER — RIZATRIPTAN BENZOATE 10 MG PO TABS: 10.0000 mg | ORAL_TABLET | ORAL | Status: DC | PRN

## 2011-11-23 NOTE — Assessment & Plan Note (Signed)
Ok for pain med refill,  to f/u any worsening symptoms or concerns 

## 2011-11-23 NOTE — Progress Notes (Signed)
Subjective:    Patient ID: Cassandra Gallagher, female    DOB: 03-28-1960, 51 y.o.   MRN: [id number]  HPI    Here to f/u; saw Dr Penni Homans, who per pt described possible facet joint in the neck, s/p ESI with improved right neck pain, but still signficant left sided pain, only gets 5 hrs sleep per night.  Has another ESI scheduled to lower back later this month,  Pt asks to come here as copay is less and o/w should be medically ok.    Pt continues to have recurring LBP and neck pain without change in severity, bowel or bladder change, fever, wt loss,  worsening LE pain/numbness/weakness, gait change or falls.  Pt denies chest pain, increased sob or doe, wheezing, orthopnea, PND, increased LE swelling, palpitations, dizziness or syncope.   Pt denies polydipsia, polyuria.   Pt denies fever, wt loss, night sweats, loss of appetite, or other constitutional symptoms.  Does have recurring migraine no change in severity or frequency.  Denies worsening depressive symptoms, suicidal ideation, or panic Past Medical History  Diagnosis Date  . DEPRESSIVE DISORDER 07/26/2007  . UTI 05/11/2007  . Lump or mass in breast 11/14/2008  . 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 Cervicalgia 07/26/2007  . BACK PAIN 07/26/2007  . Palpitations 09/21/2008  . Grave's disease     hx of Grave's Disease/Hyperthyroidism  . Hx of migraines   . Anxiety 06/02/2010  . Insomnia 06/02/2010  . 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 Lumbar degenerative disc disease 01/20/2011   Past Surgical History  Procedure Date  . Breast biopsy     reports that she has quit smoking. She has never used smokeless tobacco. She reports that she does not drink alcohol or use illicit drugs. family history includes Hypertension in her mother. No Known Allergies Current Outpatient Prescriptions on File Prior to Visit  Medication Sig Dispense Refill  . clonazePAM (KLONOPIN) 0.5 MG tablet Take 1 tablet (0.5 mg total) by mouth 2 (two) times  daily as needed.  60 tablet  5  . COMBIPATCH 0.05-0.14 MG/DAY Place 1 patch onto the skin Every third day.      . Naproxen Sodium (NAPRELAN) 375 MG TB24 2 (two) times daily.        . nortriptyline (PAMELOR) 10 MG capsule Take 1 capsule (10 mg total) by mouth at bedtime.  30 capsule  1  . DISCONTD: rizatriptan (MAXALT) 10 MG tablet Take 1 tablet (10 mg total) by mouth as needed for migraine. May repeat in 2 hours if needed  10 tablet  2   Review of Systems  Constitutional: Negative for diaphoresis and unexpected weight change.  HENT: Negative for tinnitus.   Eyes: Negative for photophobia and visual disturbance.  Respiratory: Negative for choking and stridor.   Gastrointestinal: Negative for vomiting and blood in stool.  Genitourinary: Negative for hematuria and decreased urine volume.  Musculoskeletal: Negative for gait problem.  Skin: Negative for color change and wound.  Neurological: Negative for tremors and numbness.  Psychiatric/Behavioral: Negative for decreased concentration. The patient is not hyperactive.       Objective:   Physical Exam BP 110/80  Pulse 90  Temp 97.7 F (36.5 C) (Oral)  Ht 5\' 6"  (1.676 m)  Wt 145 lb 6 oz (65.942 kg)  BMI 23.46 kg/m2  SpO2 97%  LMP 10/18/2011 Physical Exam  VS noted Constitutional: Pt appears well-developed and well-nourished.  HENT: Head: Normocephalic.  Right Ear: External ear normal.  Left  Ear: External ear normal.  Eyes: Conjunctivae and EOM are normal. Pupils are equal, round, and reactive to light.  Neck: Normal range of motion. Neck supple.  Cardiovascular: Normal rate and regular rhythm.   Pulmonary/Chest: Effort normal and breath sounds normal.  Neurological: Pt is alert. Not confused , motor/dtr/gait intact, spine with diffuse cervical/lumbar tender Skin: Skin is warm. No erythema.  Psychiatric: Pt behavior is normal. Thought content normal. 1+ nervous    Assessment & Plan:

## 2011-11-23 NOTE — Telephone Encounter (Signed)
Patient has appt. This am 11/23/11.

## 2011-11-23 NOTE — Assessment & Plan Note (Signed)
stable overall by hx and exam, most recent data reviewed with pt, and pt to continue medical treatment as before  Lab Results  Component Value Date   WBC 4.4* 09/12/2010   HGB 13.0 09/12/2010   HCT 39.2 09/12/2010   PLT 284.0 09/12/2010   GLUCOSE 97 09/12/2010   CHOL 188 09/12/2010   TRIG 94.0 09/12/2010   HDL 45.80 09/12/2010   LDLCALC 123* 09/12/2010   ALT 24 09/12/2010   AST 22 09/12/2010   NA 143 09/12/2010   K 3.9 09/12/2010   CL 105 09/12/2010   CREATININE 0.8 09/12/2010   BUN 18 09/12/2010   CO2 29 09/12/2010   TSH 0.71 09/12/2010    

## 2011-11-23 NOTE — Patient Instructions (Addendum)
Continue all other medications as before Please keep your appointments with your specialists as you have planned - the shots with Dr Jess Barters Please have the pharmacy call with any refills you may need. Thank you for enrolling in MyChart. Please follow the instructions below to securely access your online medical record.  Please go to LAB in the Basement for the blood and/or urine tests to be done today You will be contacted by phone if any changes need to be made immediately.  Otherwise, you will receive a letter about your results with an explanation, unless you sign up for Mychart MyChart allows you to send messages to your doctor, view your test results, renew your prescriptions, schedule appointments, and more.  How Do I Sign Up? 1. In your Internet browser, go to [email or web address] 2. Click on the New  User? link in the Sign In box.  3. Enter your MyChart Access Code exactly as it appears below. You will not need to use this code after you have completed the sign-up process. If you do not sign up before the expiration date, you must request a new code. MyChart Access Code: [email or web address] Expires: 11/25/2011  2:57 PM  4. Enter the last four digits of your Social Security Number (xxxx) and Date of Birth (mm/dd/yyyy) as indicated and click Next. You will be taken to the next sign-up page. 5. Create a MyChart ID. This will be your MyChart login ID and cannot be changed, so think of one that is secure and easy to remember. 6. Create a MyChart password. You can change your password at any time. 7. Enter your Password Reset Question and Answer and click Next. This can be used at a later time if you forget your password.  8. Select your communication preference, and if applicable enter your e-mail address. You will receive e-mail notification when new information is available in MyChart by choosing to receive e-mail notifications and filling in your e-mail. 9. Click Sign In.  You can now view your medical record.   Additional Information If you have questions, you can email [email or web address] or call [phone] to talk to our MyChart staff. Remember, MyChart is NOT to be used for urgent needs. For medical emergencies, dial 911.  Please return in 6 months, or sooner if needed

## 2011-11-23 NOTE — Assessment & Plan Note (Signed)
stable overall by hx and exam, and pt to continue medical treatment as before - for imitrex refill

## 2011-12-04 ENCOUNTER — Telehealth: Payer: Self-pay

## 2011-12-04 NOTE — Telephone Encounter (Signed)
Lm advising patient we cannot do both injections at the same time she would need another appointment and to discuss the option with Dr Wynn Banker.

## 2011-12-04 NOTE — Telephone Encounter (Signed)
Patient called requesting to have another neck injection with her back injection at her next appointment.

## 2011-12-07 ENCOUNTER — Ambulatory Visit: Payer: BC Managed Care – PPO | Admitting: Internal Medicine

## 2011-12-10 ENCOUNTER — Ambulatory Visit (HOSPITAL_BASED_OUTPATIENT_CLINIC_OR_DEPARTMENT_OTHER): Payer: BC Managed Care – PPO | Admitting: Physical Medicine & Rehabilitation

## 2011-12-10 ENCOUNTER — Encounter: Payer: Self-pay | Admitting: Physical Medicine & Rehabilitation

## 2011-12-10 VITALS — BP 130/63 | HR 97 | Resp 14 | Ht 66.0 in | Wt 148.0 lb

## 2011-12-10 DIAGNOSIS — M47812 Spondylosis without myelopathy or radiculopathy, cervical region: Secondary | ICD-10-CM

## 2011-12-10 NOTE — Progress Notes (Signed)
Left C-4-5-6 medial branch block under fluoroscopic guidance Indication is cervical spondylosis with pain during extension of the cervical spine as well as pain with palpation along the lateral neck. Informed consent was obtained after describing risks and benefits of the procedure with the patient. She gets only partial relief with narcotic analgesic medications. Her pain does interfere with sleep Patient placed in a left lateral decubitus position lateral neck was marked and prepped with Betadine skin wheal raised with 0.5 mL of 1% lidocaine using a 27-gauge 1/2 inch needle at each of 3 sites. Then a 25-gauge 1.5 inch needle was inserted under fluoroscopic guidance targeting the midpoint of the articular pillar first at C4 and C5 and C6. Once bone contact was made AP images confirmed proper location. Then 0.3 mL of 1% MPF lidocaine was injected into each of 3 sites. Patient tolerated procedure well. Return to clinic in 3 weeks for lumbar medial branch blocks

## 2011-12-10 NOTE — Patient Instructions (Signed)
Your next injection will be in the lumbar spine. We'll do bilateral injections at L3-L4-L5

## 2011-12-10 NOTE — Progress Notes (Signed)
  PROCEDURE RECORD [facility]   Name: Cassandra Gallagher DOB:[birth year] MRN: [id number]  Date:12/10/2011  Physician: Claudette Laws, MD    Nurse/CMA: Kelli Churn, CMA  Allergies: No Known Allergies  Consent Signed: yes  Is patient diabetic? no   Pregnant: no LMP: Patient's last menstrual period was 10/18/2011. (age 52-55)  Anticoagulants: no Anti-inflammatory: no Antibiotics: no  Procedure:  Neck injection    Position: Prone Start Time:  140pm  End Time: 154  Fluoro Time: 17  RN/CMA Levens, CMA Levens, CMA    Time 107pm 157    BP 130/63 133/77    Pulse 97 90    Respirations 14 14    O2 Sat 99 99    S/S 6 6    Pain Level 1/10 0/10     D/C home with Husband-[name], [name] & O X 3, D/C instructions reviewed, and sits independently.

## 2011-12-16 ENCOUNTER — Other Ambulatory Visit (INDEPENDENT_AMBULATORY_CARE_PROVIDER_SITE_OTHER): Payer: BC Managed Care – PPO

## 2011-12-16 DIAGNOSIS — Z0000 Encounter for general adult medical examination without abnormal findings: Secondary | ICD-10-CM

## 2011-12-16 LAB — HEPATIC FUNCTION PANEL
Albumin: 3.9 g/dL (ref 3.5–5.2)
Alkaline Phosphatase: 45 U/L (ref 39–117)

## 2011-12-16 LAB — CBC WITH DIFFERENTIAL/PLATELET
Eosinophils Relative: 2.9 % (ref 0.0–5.0)
HCT: 39.9 % (ref 36.0–46.0)
Hemoglobin: 13 g/dL (ref 12.0–15.0)
Lymphs Abs: 1.4 10*3/uL (ref 0.7–4.0)
Monocytes Relative: 9.6 % (ref 3.0–12.0)
Neutro Abs: 3.1 10*3/uL (ref 1.4–7.7)
WBC: 5.1 10*3/uL (ref 4.5–10.5)

## 2011-12-16 LAB — URINALYSIS, ROUTINE W REFLEX MICROSCOPIC
Ketones, ur: NEGATIVE
Specific Gravity, Urine: 1.02 (ref 1.000–1.030)
Urine Glucose: NEGATIVE
pH: 6 (ref 5.0–8.0)

## 2011-12-16 LAB — BASIC METABOLIC PANEL
BUN: 15 mg/dL (ref 6–23)
Calcium: 9.4 mg/dL (ref 8.4–10.5)
GFR: 88.72 mL/min (ref >60.00)
Glucose, Bld: 103 mg/dL — ABNORMAL HIGH (ref 70–99)
Sodium: 141 mEq/L (ref 135–145)

## 2011-12-16 LAB — LIPID PANEL
HDL: 34.8 mg/dL — ABNORMAL LOW (ref >39.00)
Triglycerides: 154 mg/dL — ABNORMAL HIGH (ref 0.0–149.0)

## 2011-12-16 LAB — LDL CHOLESTEROL, DIRECT: Direct LDL: 190.2 mg/dL

## 2011-12-17 ENCOUNTER — Encounter: Payer: Self-pay | Admitting: Internal Medicine

## 2011-12-17 DIAGNOSIS — E785 Hyperlipidemia, unspecified: Secondary | ICD-10-CM | POA: Insufficient documentation

## 2011-12-17 HISTORY — DX: Hyperlipidemia, unspecified: E78.5

## 2012-01-05 ENCOUNTER — Other Ambulatory Visit: Payer: Self-pay | Admitting: Internal Medicine

## 2012-01-05 ENCOUNTER — Encounter: Payer: Self-pay | Admitting: Internal Medicine

## 2012-01-05 DIAGNOSIS — E785 Hyperlipidemia, unspecified: Secondary | ICD-10-CM

## 2012-01-11 ENCOUNTER — Encounter: Payer: Self-pay | Admitting: Physical Medicine & Rehabilitation

## 2012-01-12 ENCOUNTER — Telehealth: Payer: Self-pay

## 2012-01-12 NOTE — Telephone Encounter (Signed)
Patient aware that she did not miss any appointments, the appointments she saw were bogus appointments.  Patient understands.

## 2012-01-14 ENCOUNTER — Encounter: Payer: Self-pay | Admitting: Physical Medicine & Rehabilitation

## 2012-01-14 ENCOUNTER — Ambulatory Visit (HOSPITAL_BASED_OUTPATIENT_CLINIC_OR_DEPARTMENT_OTHER): Payer: BC Managed Care – PPO | Admitting: Physical Medicine & Rehabilitation

## 2012-01-14 ENCOUNTER — Encounter: Payer: BC Managed Care – PPO | Attending: Physical Medicine & Rehabilitation

## 2012-01-14 VITALS — BP 128/61 | HR 85 | Resp 14 | Ht 66.0 in | Wt 149.0 lb

## 2012-01-14 DIAGNOSIS — M5030 Other cervical disc degeneration, unspecified cervical region: Secondary | ICD-10-CM | POA: Insufficient documentation

## 2012-01-14 DIAGNOSIS — G894 Chronic pain syndrome: Secondary | ICD-10-CM | POA: Insufficient documentation

## 2012-01-14 DIAGNOSIS — M47817 Spondylosis without myelopathy or radiculopathy, lumbosacral region: Secondary | ICD-10-CM

## 2012-01-14 DIAGNOSIS — M47812 Spondylosis without myelopathy or radiculopathy, cervical region: Secondary | ICD-10-CM | POA: Insufficient documentation

## 2012-01-14 DIAGNOSIS — M5380 Other specified dorsopathies, site unspecified: Secondary | ICD-10-CM | POA: Insufficient documentation

## 2012-01-14 DIAGNOSIS — M542 Cervicalgia: Secondary | ICD-10-CM | POA: Insufficient documentation

## 2012-01-14 NOTE — Progress Notes (Signed)
  PROCEDURE RECORD [facility]   Name: Cassandra Gallagher DOB:[birth year] MRN: [id number]  Date:01/14/2012  Physician: Claudette Laws, MD    Nurse/CMA: Judeth Cornfield, CMA  Allergies: No Known Allergies  Consent Signed: yes  Is patient diabetic? no  CBG today?   Pregnant: no LMP: Patient's last menstrual period was 01/07/2012. (age 52-54)  Anticoagulants: no Anti-inflammatory: no Antibiotics: no  Procedure: Bilateral Medial Branch Block  Position: Prone Start Time: 1:37pm End Time:  1:48pm    Fluoro Time: 30  RN/CMA Judeth Cornfield, CMA [name], CMA    Time 1:15     BP 128/61 118/71    Pulse 85 89    Respirations 14 16    O2 Sat 99 98    S/S 6 6    Pain Level 4 3     D/C home with Donley Redder, husband, [name] & O X 3, D/C instructions reviewed, and sits independently.

## 2012-01-14 NOTE — Patient Instructions (Signed)
See me in 1 month     Facet Block A facet block is an injection procedure used to numb nerves near a spinal joint (facet). The injection usually includes a medicine like Novacaine (anesthetic) and a steroid medicine (similar to cortisone). The injections are made directly into the facet joint of the back. They are used for patients with several types of neck or back pain problems (such as worsening arthritis or persistent pain after surgery) that have not been helped with anti-inflammatory medications, exercise programs, physical therapy, and other forms of pain management. Multiple injections may be needed depending on how many joints are involved.  A facet block procedure can be helpful with diagnosis as well as providing therapeutic pain relief. One of three things may happen after the procedure:  The pain does not go away. This can mean that the pain is probably not coming from blocked facet joints. This information is helpful with diagnosis.  The pain goes away and stays away for a few hours but the original pain comes back and does not get better again. This information is also helpful with diagnosis. It can mean that pain is probably coming from the joints; but the steroid was not helpful for longer term pain control.  The pain goes away after the block, then returns later that day, and then gets better again over the next few days. This can mean that the block was helpful for pain control and the steroid had a longer lasting effect. If there is good, lasting benefit from the injections, the block may be repeated from 3 to 5 times. If there is good relief but it is only of short-term benefit, other procedures (such as radiofrequency lesioning) may be considered.  Note: The procedure cannot be performed if you have an active infection, a lesion on or near the area of injection, flu, cold, fever, very high blood pressure or if you are on blood thinners. Please make your doctor aware of any of these  conditions. This is for your safety!  LET YOUR CAREGIVER KNOW ABOUT:   Allergies.  Medications taken including herbs, eye drops, over the counter medications, and creams  Use of steroids (by mouth or creams).  Possible pregnancy, if applicable.  Previous problems with anesthetics or Novocaine.  History of blood clots.  History of bleeding or blood problems.  Previous surgery, particularly of the neck and/or back  Other health problems. RISKS AND COMPLICATIONS These are very uncommon but include:  Bleeding.  Injury to a nerve near the injection site.  Weakness or numbness in areas controlled by nerves near the injection site.  Infection.  Pain at the site of the injection.  Temporary fluid retention in those who are prone to this problem.  Allergic reaction to anesthetics or medicines used during the procedure. Diabetics may have a temporary increase in their blood sugar after any surgical procedure, especially if steroids are used. Stinging/burning of the numbing medicine is the most uncomfortable part of the procedure; however every person's response to any procedure is individual.  BEFORE THE PROCEDURE   Your caregiver will provide instructions about stopping any medication before the procedure.  Unless advised otherwise, if the injections are in your neck, you may take your medications as usual with a sip of water but do not eat or drink for 6 hours before the procedure.  Unless advised otherwise, you may eat, drink and take your medications as usual on the day of the procedure (both before and after) if  the injections are to be in your lower back.  There is no other specific preparation necessary unless advised otherwise. PROCEDURE After checking your blood pressure, the procedure will be done in the x-ray (fluoroscopy) room while lying on your stomach. For procedures in the neck, an intravenous line is usually started. The back is then cleansed with an antiseptic  soap. Sterile drapes are placed in this area. The skin is numbed with a local anesthetic. This is felt as a stinging or burning sensation. Using x-ray guidance, needles are then advanced to the appropriate locations. Once the needles are in the proper location, the anesthetic and steroid is injected through the needles and the needles are removed. The skin is then cleansed and bandages are applied. Blood pressure will be checked again, and you will be discharged to leave with your ride after your caregiver says it is okay to go.  AFTER THE PROCEDURE  You may not drive for the remainder of the day after your procedure. An adult must be present to drive you home or to go with you in a taxi or on public transportation. The procedure will be canceled if you do not have a responsible adult with you! This is for your safety.  HOME CARE INSTRUCTIONS   The bandages noted above can be removed on the morning after the procedure.  Resume medications according to your caregiver's instructions.  No heat is to be used near or over the injected area(s) for the remainder of the day.  No tub bath or soaking in water (such as a pool, jacuzzi, etc.) for the remainder of the day.  Some local tenderness may be experienced for a couple of days after the injection. Using an ice pack three or four times a day will help this.  Keep track of the amount of pain relief as well as how long the pain relief lasted. SEEK MEDICAL CARE IF:   There is drainage from the injection site.  Pain is not controlled with medications prescribed.  There is significant bleeding or swelling. SEEK IMMEDIATE MEDICAL CARE IF:   You develop a fever of 101 F (38.3 C) or greater.  Worsening pain, swelling, and/or red streaking develops in the skin around the injection site.  Severe pain develops and cannot be controlled with medications prescribed.  You develop any headache, stiff neck, nausea, vomiting, or your eyes become very  sensitive to light.  Weakness or paralysis develops in arms or legs not present before the procedure.  You develop difficulty urinating or difficulty breathing. Document Released: 06/17/2006 Document Revised: 04/20/2011 Document Reviewed: 06/07/2008 [facility] Patient Information 2013 [place], [place].

## 2012-01-14 NOTE — Progress Notes (Signed)

## 2012-01-18 ENCOUNTER — Other Ambulatory Visit: Payer: Self-pay | Admitting: Endocrinology

## 2012-01-20 ENCOUNTER — Other Ambulatory Visit: Payer: Self-pay | Admitting: Internal Medicine

## 2012-01-20 ENCOUNTER — Telehealth: Payer: Self-pay | Admitting: Internal Medicine

## 2012-01-20 MED ORDER — CLONAZEPAM 0.5 MG PO TABS: ORAL_TABLET | ORAL | Status: DC

## 2012-01-20 NOTE — Telephone Encounter (Signed)
Done hardcopy to [facility]  

## 2012-01-20 NOTE — Telephone Encounter (Signed)
rx re-done as not transmitted/faxed legibly to pharmacy  Done hardcopy to [facility]

## 2012-01-20 NOTE — Telephone Encounter (Signed)
Faxed hardcopy to pharmacy. 

## 2012-01-21 NOTE — Telephone Encounter (Signed)
Faxed corrected hardcopy to [facility]

## 2012-02-15 ENCOUNTER — Encounter: Payer: Self-pay | Admitting: Physical Medicine & Rehabilitation

## 2012-02-15 ENCOUNTER — Telehealth: Payer: Self-pay | Admitting: *Deleted

## 2012-02-15 ENCOUNTER — Other Ambulatory Visit: Payer: Self-pay | Admitting: Internal Medicine

## 2012-02-15 ENCOUNTER — Encounter: Payer: Self-pay | Admitting: Internal Medicine

## 2012-02-15 ENCOUNTER — Ambulatory Visit: Payer: BC Managed Care – PPO | Admitting: Physical Medicine & Rehabilitation

## 2012-02-15 ENCOUNTER — Telehealth: Payer: Self-pay | Admitting: Internal Medicine

## 2012-02-15 ENCOUNTER — Telehealth: Payer: Self-pay

## 2012-02-15 MED ORDER — HYDROCODONE-ACETAMINOPHEN 7.5-325 MG PO TABS: 1.0000 | ORAL_TABLET | ORAL | Status: DC

## 2012-02-15 MED ORDER — OXYCODONE HCL 10 MG PO TB12: 10.0000 mg | ORAL_TABLET | Freq: Two times a day (BID) | ORAL | Status: DC | PRN

## 2012-02-15 NOTE — Telephone Encounter (Signed)
Faxed hardcopy of hydrocodone [phone] #[phone] with 5 refills. to [facility]

## 2012-02-15 NOTE — Telephone Encounter (Signed)
Done hardcopy to [facility]  

## 2012-02-16 NOTE — Telephone Encounter (Signed)
Called the patient left detailed message that hardcopy's are ready for pickup at the front desk. 

## 2012-02-23 ENCOUNTER — Ambulatory Visit (HOSPITAL_BASED_OUTPATIENT_CLINIC_OR_DEPARTMENT_OTHER): Payer: BC Managed Care – PPO | Admitting: Physical Medicine & Rehabilitation

## 2012-02-23 ENCOUNTER — Encounter: Payer: Self-pay | Admitting: Internal Medicine

## 2012-02-23 ENCOUNTER — Encounter: Payer: BC Managed Care – PPO | Attending: Physical Medicine & Rehabilitation

## 2012-02-23 ENCOUNTER — Telehealth: Payer: Self-pay | Admitting: Internal Medicine

## 2012-02-23 ENCOUNTER — Encounter: Payer: Self-pay | Admitting: Physical Medicine & Rehabilitation

## 2012-02-23 VITALS — BP 110/66 | HR 81 | Resp 14 | Ht 66.0 in | Wt 150.0 lb

## 2012-02-23 DIAGNOSIS — G894 Chronic pain syndrome: Secondary | ICD-10-CM | POA: Insufficient documentation

## 2012-02-23 DIAGNOSIS — M47816 Spondylosis without myelopathy or radiculopathy, lumbar region: Secondary | ICD-10-CM

## 2012-02-23 DIAGNOSIS — M47812 Spondylosis without myelopathy or radiculopathy, cervical region: Secondary | ICD-10-CM

## 2012-02-23 DIAGNOSIS — IMO0002 Reserved for concepts with insufficient information to code with codable children: Secondary | ICD-10-CM

## 2012-02-23 DIAGNOSIS — M542 Cervicalgia: Secondary | ICD-10-CM | POA: Insufficient documentation

## 2012-02-23 DIAGNOSIS — M5030 Other cervical disc degeneration, unspecified cervical region: Secondary | ICD-10-CM | POA: Insufficient documentation

## 2012-02-23 DIAGNOSIS — M5380 Other specified dorsopathies, site unspecified: Secondary | ICD-10-CM | POA: Insufficient documentation

## 2012-02-23 DIAGNOSIS — M47817 Spondylosis without myelopathy or radiculopathy, lumbosacral region: Secondary | ICD-10-CM

## 2012-02-23 DIAGNOSIS — G43709 Chronic migraine without aura, not intractable, without status migrainosus: Secondary | ICD-10-CM

## 2012-02-23 NOTE — Patient Instructions (Addendum)
WeRadiofrequency Lesioning Radiofrequency lesioning is a procedure to relieve pain. The procedure is often used for back, neck, or arm pain. Radiofrequency lesioning uses a specialized machine that creates radio waves to make heat. The heat damages the nerve that carries the pain signal. Pain relief usually lasts 6 months to 1 year.  LET YOUR CAREGIVER KNOW ABOUT:  Allergies to food or medicine.  Medicines taken, including vitamins, herbs, eyedrops, over-the-counter medicines, and creams.  Use of steroids (by mouth or creams).  Previous problems with anesthetics or numbing medicines.  History of bleeding problems or blood clots.  Previous surgery.  Other health problems, including diabetes and kidney problems.  Possibility of pregnancy, if this applies.  Breathing problems and smoking history.  Any recent colds or infections. RISKS AND COMPLICATIONS This procedure is generally safe. The risks and complications depend on what treatment site is used. General complications may include:  Pain or soreness at the injection site.  Infection at the injection site.  Damage to nerves or blood vessels. BEFORE THE PROCEDURE  Ask your caregiver about changing or stopping any medicines you are on before the procedure.  If you take blood thinners, ask if you should stop taking them before the procedure.  You may be asked to wash with an antibiotic soap before the procedure.  Do not eat or drink for 8 hours before your procedure or as told by your caregiver.  Ask your caregiver what time you need to arrive for your procedure.  This is an outpatient procedure. This means you will be able to go home the same day. Make plans for someone to drive you home. PROCEDURE  You will be awake during the procedure. You need to be able to talk to the surgeon during the procedure. However, you might be given medicine to help you relax (sedative).  Medicine to numb the area (local anesthetic) will be  injected.  With the help of a type of X-ray (fluoroscopy), a radio frequency needle will be inserted into the area to be treated. Then, a wire that carries the radio waves (electrode) will be put through the radio frequency needle. An electrical pulse will be sent through the electrode to verify the correct nerve.  You will feel a tingling sensation similar to hitting your "funny bone." You may also have muscle twitching. The tissue around the needle tip is then heated when electric current is passed using the radio frequency machine. This numbs the nerves.  A bandage (dressing) will be put on the area after the procedure is done. AFTER THE PROCEDURE  You will stay in a recovery area until you are awake enough to eat and drink.  Once everything is back to normal, you will be able to go home.  You will need to arrange for someone to drive you home if you received a sedative or pain relieving medicine during the procedure. Document Released: 09/24/2010 Document Revised: 04/20/2011 Document Reviewed: 09/24/2010 [facility] Patient Information 2013 [place], [place].  discussed  Botox for migraines. Please refer to the literature We discussed radiofrequency neurotomy, please refer to literature We will do medial branch blocks of the lumbar spine next visit

## 2012-02-23 NOTE — Progress Notes (Signed)
Subjective:    Patient ID: Cassandra Gallagher, female    DOB: Oct 22, 1960, 52 y.o.   MRN: [id number]  HPI L3-4-5 MBB 100% relief for 3 wks then partial relief which continues.Overall the patient feels like the injection is starting to wear off  Neck pain is still better since the cervical medial branch blocks.  Still complaining of headache pain and chronic migraines. Has been on Maxalt with maximum doses. Also has tried tricyclic antidepressant as a suppressant agent. Has never tried Botox  Pain Inventory Average Pain 3 Pain Right Now 0 My pain is intermittent, constant and tingling  In the last 24 hours, has pain interfered with the following? General activity 6 Relation with others 5 Enjoyment of life 5 What TIME of day is your pain at its worst? evening Sleep (in general) Fair  Pain is worse with: walking, sitting and some activites Pain improves with: rest, pacing activities and medication Relief from Meds: 8  Mobility walk without assistance how many minutes can you walk? 45 ability to climb steps?  yes do you drive?  yes Do you have any goals in this area?  yes  Function employed # of hrs/week 30 I need assistance with the following:  meal prep and household duties Do you have any goals in this area?  yes  Neuro/Psych tingling  Prior Studies Any changes since last visit?  no  Physicians involved in your care Any changes since last visit?  no   Family History  Problem Relation Age of Onset  . Hypertension Mother    History   Social History  . Marital Status: Married    Spouse Name: N/A    Number of Children: 2  . Years of Education: N/A   Occupational History  . Production designer, theatre/television/film   Social History Main Topics  . Smoking status: Former Games developer  . Smokeless tobacco: Never Used     Comment: smoked for 6 years, quit 15 years ago  . Alcohol Use: No  . Drug Use: No  . Sexually Active: None   Other Topics Concern  . None   Social History  Narrative   2nd child born recently in September 2010.Married, one child from previous marriage lives in [place]   Past Surgical History  Procedure Date  . Breast biopsy    Past Medical History  Diagnosis Date  . DEPRESSIVE DISORDER 07/26/2007  . UTI 05/11/2007  . Lump or mass in breast 11/14/2008  . 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 Cervicalgia 07/26/2007  . BACK PAIN 07/26/2007  . Palpitations 09/21/2008  . Grave's disease     hx of Grave's Disease/Hyperthyroidism  . Hx of migraines   . Anxiety 06/02/2010  . Insomnia 06/02/2010  . 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 Lumbar degenerative disc disease 01/20/2011  . Hyperlipidemia 12/17/2011   BP 110/66  Pulse 81  Resp 14  Ht 5\' 6"  (1.676 m)  Wt 150 lb (68.04 kg)  BMI 24.21 kg/m2  SpO2 97%  LMP 02/12/2012     Review of Systems  HENT: Positive for neck pain.   Musculoskeletal: Positive for back pain.  All other systems reviewed and are negative.       Objective:   Physical Exam  Nursing note and vitals reviewed. Constitutional: She is oriented to person, place, and time. She appears well-developed and well-nourished.  HENT:  Head: Normocephalic and atraumatic.  Eyes: Conjunctivae normal and EOM are normal. Pupils are equal, round, and reactive to  light.  Neurological: She is alert and oriented to person, place, and time.  Psychiatric: She has a normal mood and affect. Her behavior is normal. Judgment and thought content normal.     Tenderness palpation over the occipital area mild Lumbar spine has mild tenderness palpation L5-L4 paraspinal muscles. She has normal spine range of motion with mild pain with hyperextension of the lumbar spine. Neck range of motion is good Motor strength is normal in the upper and lower limbs. Deep tendon reflexes are normal Ambulation is normal     Assessment & Plan:  1. Lumbar spondylosis improvement after medial branch blocks. Effects starting to taper  off.Will repeat in one month 2. Cervical spondylosis improvement after medial branch blocks  3. Chronic migraine may benefit from Botox. She has daily headaches and consistently takes all of her Maxalt doses each month

## 2012-02-23 NOTE — Telephone Encounter (Signed)
Cassandra Gallagher to call pt Wed jan 14 - to ask if she would like to be seen by me or [name] with ? Of pna

## 2012-02-24 ENCOUNTER — Ambulatory Visit (INDEPENDENT_AMBULATORY_CARE_PROVIDER_SITE_OTHER): Payer: BC Managed Care – PPO | Admitting: Internal Medicine

## 2012-02-24 ENCOUNTER — Encounter: Payer: Self-pay | Admitting: Internal Medicine

## 2012-02-24 VITALS — BP 102/82 | HR 69 | Temp 97.3°F | Ht 66.0 in | Wt 151.2 lb

## 2012-02-24 DIAGNOSIS — R05 Cough: Secondary | ICD-10-CM

## 2012-02-24 DIAGNOSIS — R059 Cough, unspecified: Secondary | ICD-10-CM

## 2012-02-24 DIAGNOSIS — J209 Acute bronchitis, unspecified: Secondary | ICD-10-CM

## 2012-02-24 MED ORDER — LEVOFLOXACIN 500 MG PO TABS: 500.0000 mg | ORAL_TABLET | Freq: Every day | ORAL | Status: DC

## 2012-02-24 MED ORDER — HYDROCODONE-HOMATROPINE 5-1.5 MG/5ML PO SYRP: 5.0000 mL | ORAL_SOLUTION | Freq: Three times a day (TID) | ORAL | Status: DC | PRN

## 2012-02-24 NOTE — Progress Notes (Signed)
HPI  Pt presents to the clinic today with c/o cold symptoms x 2 weeks. The worst part is the sore throat and dry cough. She does not produce any sputum. she was running fevers last week but none this week. Shee has tried Robitussin, Mucinex, cough drops and nothing seems to help. The cough is worse at night. Shee has not had much sleep in 3 nights. She does have sick contacts.  Review of Systems      Past Medical History  Diagnosis Date  . DEPRESSIVE DISORDER 07/26/2007  . UTI 05/11/2007  . Lump or mass in breast 11/14/2008  . 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 Cervicalgia 07/26/2007  . BACK PAIN 07/26/2007  . Palpitations 09/21/2008  . Grave's disease     hx of Grave's Disease/Hyperthyroidism  . Hx of migraines   . Anxiety 06/02/2010  . Insomnia 06/02/2010  . 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 Lumbar degenerative disc disease 01/20/2011  . Hyperlipidemia 12/17/2011    Family History  Problem Relation Age of Onset  . Hypertension Mother     History   Social History  . Marital Status: Married    Spouse Name: N/A    Number of Children: 2  . Years of Education: N/A   Occupational History  . Production designer, theatre/television/film   Social History Main Topics  . Smoking status: Former Games developer  . Smokeless tobacco: Never Used     Comment: smoked for 6 years, quit 15 years ago  . Alcohol Use: No  . Drug Use: No  . Sexually Active: Not on file   Other Topics Concern  . Not on file   Social History Narrative   2nd child born recently in September 2010.Married, one child from previous marriage lives in [place]    No Known Allergies   Constitutional: Positive headache, fatigue and fever. Denies abrupt weight changes.  HEENT:  Positive sore throat. Denies eye redness, eye pain, pressure behind the eyes, facial pain, nasal congestion, ear pain, ringing in the ears, wax buildup, runny nose or bloody nose. Respiratory: Positive cough. Denies difficulty breathing or  shortness of breath.  Cardiovascular: Denies chest pain, chest tightness, palpitations or swelling in the hands or feet.   No other specific complaints in a complete review of systems (except as listed in HPI above).  Objective:   BP 102/82  Pulse 69  Temp 97.3 F (36.3 C) (Oral)  Ht 5\' 6"  (1.676 m)  Wt 151 lb 3.2 oz (68.584 kg)  BMI 24.40 kg/m2  SpO2 98%  LMP 02/12/2012 Wt Readings from Last 3 Encounters:  02/24/12 151 lb 3.2 oz (68.584 kg)  02/23/12 150 lb (68.04 kg)  01/14/12 149 lb (67.586 kg)     General: Appears her stated age, well developed, well nourished in NAD. HEENT: Head: normal shape and size; Eyes: sclera white, no icterus, conjunctiva pink, PERRLA and EOMs intact; Ears: Tm's gray and intact, normal light reflex; Nose: mucosa pink and moist, septum midline; Throat/Mouth: + PND. Teeth present, mucosa erythematous and moist, no exudate noted, no lesions or ulcerations noted.  Neck: Mild cervical lymphadenopathy. Neck supple, trachea midline. No massses, lumps or thyromegaly present.  Cardiovascular: Normal rate and rhythm. S1,S2 noted.  No murmur, rubs or gallops noted. No JVD or BLE edema. No carotid bruits noted. Pulmonary/Chest: Normal effort and scattered ronchi throughout. No respiratory distress. No wheezes, rales or ronchi noted.      Assessment & Plan:  Acute Bronchitis, new onset with  additional workup required:  Get some rest and drink plenty of water Do salt water gargles for the sore throat eRx for Levaquin x 7 days eRx for Hycodan cough syrup  RTC as needed or if symptoms persist.

## 2012-02-24 NOTE — Patient Instructions (Signed)

## 2012-02-24 NOTE — Telephone Encounter (Signed)
Appt at 11:00 today with [facility].

## 2012-02-26 ENCOUNTER — Encounter: Payer: Self-pay | Admitting: Internal Medicine

## 2012-03-01 ENCOUNTER — Encounter: Payer: Self-pay | Admitting: Internal Medicine

## 2012-03-01 ENCOUNTER — Telehealth: Payer: Self-pay

## 2012-03-01 MED ORDER — ALMOTRIPTAN MALATE 12.5 MG PO TABS: 12.5000 mg | ORAL_TABLET | ORAL | Status: DC | PRN

## 2012-03-01 NOTE — Telephone Encounter (Signed)
Ok to try change to axert  - done erx

## 2012-03-01 NOTE — Telephone Encounter (Signed)
Pharmacy fax informing the patient was taking Rizatriptan 10 mg made by a different mfg. They can only get Tevas brand, mfg and it does not work for her.  Please call in alternative to [facility]

## 2012-03-02 ENCOUNTER — Telehealth: Payer: Self-pay

## 2012-03-02 NOTE — Telephone Encounter (Signed)
[name] to do PA please;  As per email:  That is interesting. Funny that the pharmacist did not tell me that. That is quite a big difference in dosage. I would prefer to try the preauthorization for Axert if is possible. I tried Imatrex years ago and remember I did not feel good about the side affects . I remember feeling an odd numbness in my hands and dizziness when I took it... and i had to lie down... though maybe that would be different now - i used it as well as Zomig when they first came available maybe 20 years ago now. Would it be possible to try to get pre-autorization, but if that fails then go to the generic IMatrex. Is it more likely do you think .... that the generic Imatrex is dosed more accurately? Would that be because it is one of the first of this kind of drug on the market and has now been around for a long time? ( Interestingly though - the first generic for Maxalt Did work... but the warehouse that [place] uses started sending them a different generic.Marland Kitchen and that is when it stopped working. ) If it ends up not being possible for Axert than I would for sure try Imatrex again. I did not love the side effects but love even less headaches. ----- Message ----- From: Oliver Barre, MD Sent: 03/02/2012 8:50 AM EST To: [name] Subject: RE: Non-Urgent Medical Question Generic maxalt not working may not be a big surprise as medications that really depend on the dose of the pill sometimes dont work as well in the generic form as they sometimes do not have the amount of medication they say they do (by law can be down to even 80%). Would you be willing to try generic Imitrex, as this does would not require the prior auth? Otherwise, we can try the prior auth, but sometimes it does not work. thanks

## 2012-03-02 NOTE — Telephone Encounter (Signed)
Please advise on PA for Axert

## 2012-03-02 NOTE — Telephone Encounter (Signed)
See email response.

## 2012-03-04 NOTE — Telephone Encounter (Signed)
Complete PA form awaiting MD's signature and fill fax and wait for response. I will notify patient once get response.

## 2012-03-18 ENCOUNTER — Other Ambulatory Visit: Payer: Self-pay | Admitting: Internal Medicine

## 2012-03-18 DIAGNOSIS — Z1231 Encounter for screening mammogram for malignant neoplasm of breast: Secondary | ICD-10-CM

## 2012-03-22 ENCOUNTER — Telehealth: Payer: Self-pay

## 2012-03-22 ENCOUNTER — Other Ambulatory Visit (INDEPENDENT_AMBULATORY_CARE_PROVIDER_SITE_OTHER): Payer: BC Managed Care – PPO

## 2012-03-22 DIAGNOSIS — E785 Hyperlipidemia, unspecified: Secondary | ICD-10-CM

## 2012-03-22 LAB — LIPID PANEL: Triglycerides: 189 mg/dL — ABNORMAL HIGH (ref 0.0–149.0)

## 2012-03-22 NOTE — Telephone Encounter (Signed)
Lab order entered.

## 2012-03-23 ENCOUNTER — Other Ambulatory Visit: Payer: Self-pay | Admitting: Internal Medicine

## 2012-03-23 ENCOUNTER — Encounter: Payer: Self-pay | Admitting: Internal Medicine

## 2012-03-23 DIAGNOSIS — E785 Hyperlipidemia, unspecified: Secondary | ICD-10-CM

## 2012-04-07 ENCOUNTER — Encounter: Payer: Self-pay | Admitting: Physical Medicine & Rehabilitation

## 2012-04-07 ENCOUNTER — Ambulatory Visit (HOSPITAL_BASED_OUTPATIENT_CLINIC_OR_DEPARTMENT_OTHER): Payer: BC Managed Care – PPO | Admitting: Physical Medicine & Rehabilitation

## 2012-04-07 ENCOUNTER — Encounter: Payer: BC Managed Care – PPO | Attending: Physical Medicine & Rehabilitation

## 2012-04-07 VITALS — BP 119/71 | HR 67 | Resp 14 | Ht 66.0 in | Wt 153.2 lb

## 2012-04-07 DIAGNOSIS — M542 Cervicalgia: Secondary | ICD-10-CM | POA: Insufficient documentation

## 2012-04-07 DIAGNOSIS — M5380 Other specified dorsopathies, site unspecified: Secondary | ICD-10-CM | POA: Insufficient documentation

## 2012-04-07 DIAGNOSIS — M5030 Other cervical disc degeneration, unspecified cervical region: Secondary | ICD-10-CM | POA: Insufficient documentation

## 2012-04-07 DIAGNOSIS — M47812 Spondylosis without myelopathy or radiculopathy, cervical region: Secondary | ICD-10-CM | POA: Insufficient documentation

## 2012-04-07 DIAGNOSIS — G894 Chronic pain syndrome: Secondary | ICD-10-CM | POA: Insufficient documentation

## 2012-04-07 DIAGNOSIS — IMO0001 Reserved for inherently not codable concepts without codable children: Secondary | ICD-10-CM

## 2012-04-07 NOTE — Progress Notes (Signed)
  PROCEDURE RECORD [facility]   Name: MADELENA MATURIN DOB:02-20-1960 MRN: [id number]  Date:04/07/2012  Physician: Claudette Laws, MD    Nurse/CMA: Shumaker RN  Allergies: No Known Allergies  Consent Signed: yes  Is patient diabetic? no  CBG today?   Pregnant: no LMP: Patient's last menstrual period was 04/04/2012. (age 52-55)  Anticoagulants: no Anti-inflammatory: no Antibiotics: no  Procedure:Trigger points done Position: Prone Start Time:  End Time:  Fluoro Time:   RN/CMA Designer, multimedia    Time 12:26     BP 119/71     Pulse 67     Respirations 14     O2 Sat 97     S/S 6     Pain Level 2/10      D/C home with husband, [name] & O X 3, D/C instructions reviewed, and sits independently.

## 2012-04-07 NOTE — Patient Instructions (Addendum)
Myofascial Pain Syndrome Myofascial pain syndrome is a pain disorder. This pain may be felt in the muscles. It may come and go. Myofascial pain syndrome always has trigger or tender points in the muscle that will cause pain when pressed.  CAUSES Myofascial pain may be caused by injuries, especially auto accidents, or by overuse of certain muscles. Typically the pain is long lasting. It is made worse by overuse of the involved muscles, emotional distress, and by cold, damp weather. Myofascial pain syndrome often develops in patients whose response to stress is an increase in muscle tone, and is seen in greater frequency in patients with pre-existing tension headaches. SYMPTOMS  Myofascial pain syndrome causes a wide variety of symptoms. You may see tight ropy bands of muscle. Problems may also include aching, cramping, burning, numbness, tingling, and other uncomfortable sensations in muscular areas. It most commonly affects the neck, upper back, and shoulder areas. Pain often radiates into the arms and hands.  TREATMENT Treatment includes resting the affected muscular area and applying ice packs to reduce spasm and pain. Trigger point injection, is a valuable initial therapy. This therapy is an injection of local anesthetic directly into the trigger point. Trigger points are often present at the source of pain. Pain relief following injection confirms the diagnosis of myofascial pain syndrome. Fairly vigorous therapy can be carried out during the pain-free period after each injection. Stretching exercises to loosen up the muscles are also useful. Transcutaneous electrical nerve stimulation (TENS) may provide relief from pain. TENS is the use of electric current produced by a device to stimulate the nerves. Ultrasound therapy applied directly over the affected muscle may also provide pain relief. Anti-inflammatory pain medicine can be helpful. Symptoms will gradually improve over a period of weeks to months  with proper treatment. HOME CARE INSTRUCTIONS Call your caregiver for follow-up care as recommended.  SEEK MEDICAL CARE IF:  Your pain is severe and not helped with medications. Document Released: 03/05/2004 Document Revised: 04/20/2011 Document Reviewed: 03/14/2010 [facility] Patient Information 2013 [place], [place].   Please call if low back starts hurting again Please call if right side of neck Pain increases

## 2012-04-07 NOTE — Progress Notes (Signed)
trigger point injections Left trapezius Left levator scapula Indication myofascial pain syndrome 1% lidocaine 25-gauge needle 2 areas in the left trapezius at the C3 paraspinal level as well as the proximal third clavicular area were marked and prepped with Betadine as well as the upper medial scapula border on left 1 cc of 1% lidocaine injected into each of 3 areas. Patient tolerated procedure well. Post procedure instructions

## 2012-04-18 ENCOUNTER — Encounter: Payer: Self-pay | Admitting: Internal Medicine

## 2012-04-18 ENCOUNTER — Telehealth: Payer: Self-pay | Admitting: Internal Medicine

## 2012-04-18 NOTE — Telephone Encounter (Signed)
Called pharmacy

## 2012-04-18 NOTE — Telephone Encounter (Signed)
[name] to let pt an pharmacy know, ok for next refill to fill early (this time only), mar 18 or after  thanks

## 2012-04-18 NOTE — Telephone Encounter (Signed)
[facility] informed of ok refill per MD.

## 2012-04-22 ENCOUNTER — Encounter: Payer: Self-pay | Admitting: Physical Medicine and Rehabilitation

## 2012-04-22 ENCOUNTER — Encounter
Payer: BC Managed Care – PPO | Attending: Physical Medicine and Rehabilitation | Admitting: Physical Medicine and Rehabilitation

## 2012-04-22 VITALS — BP 118/88 | HR 80 | Resp 14 | Ht 66.0 in | Wt 152.0 lb

## 2012-04-22 DIAGNOSIS — M542 Cervicalgia: Secondary | ICD-10-CM

## 2012-04-22 DIAGNOSIS — M47817 Spondylosis without myelopathy or radiculopathy, lumbosacral region: Secondary | ICD-10-CM | POA: Insufficient documentation

## 2012-04-22 DIAGNOSIS — G43909 Migraine, unspecified, not intractable, without status migrainosus: Secondary | ICD-10-CM | POA: Insufficient documentation

## 2012-04-22 DIAGNOSIS — M47812 Spondylosis without myelopathy or radiculopathy, cervical region: Secondary | ICD-10-CM

## 2012-04-22 DIAGNOSIS — E785 Hyperlipidemia, unspecified: Secondary | ICD-10-CM | POA: Insufficient documentation

## 2012-04-22 MED ORDER — METHOCARBAMOL 500 MG PO TABS: 500.0000 mg | ORAL_TABLET | Freq: Three times a day (TID) | ORAL | Status: DC

## 2012-04-22 NOTE — Progress Notes (Signed)
Subjective:    Patient ID: Cassandra Gallagher, female    DOB: 1960-05-31, 52 y.o.   MRN: [id number]  HPI The patient is a 52 year old female, who presents with neck pain, worse on the left . The symptoms started years ago. The patient complains about mild to moderate pain in her left trapezius and levator scapulae . Patient denies any radiating symptoms into he UEs. Patient also complains about headaches  . Applying heat, taking medications , changing positions alleviate the symptoms. Prolonged sitting, staying in one position aggrevates the symptoms. The patient grades her pain as a  2/10. She reports, that the lidocaine injections from last visit, have not helped at all. Pain Inventory Average Pain 7 Pain Right Now 1 My pain is constant, sharp, burning and aching  In the last 24 hours, has pain interfered with the following? General activity 5 Relation with others 6 Enjoyment of life 7 What TIME of day is your pain at its worst? morning and night Sleep (in general) Poor  Pain is worse with: sitting and inactivity Pain improves with: heat/ice, therapy/exercise, medication and injections Relief from Meds: 5  Mobility how many minutes can you walk? 30 ability to climb steps?  yes do you drive?  yes Do you have any goals in this area?  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  yes  Neuro/Psych weakness anxiety  Prior Studies Any changes since last visit?  no  Physicians involved in your care Any changes since last visit?  no   Family History  Problem Relation Age of Onset  . Hypertension Mother    History   Social History  . Marital Status: Married    Spouse Name: N/A    Number of Children: 2  . Years of Education: N/A   Occupational History  . Production designer, theatre/television/film   Social History Main Topics  . Smoking status: Former Games developer  . Smokeless tobacco: Never Used     Comment: smoked for 6 years, quit 15 years ago   . Alcohol Use: No  . Drug Use: No  . Sexually Active: None   Other Topics Concern  . None   Social History Narrative   2nd child born recently in September 2010.   Married, one child from previous marriage lives in [place]   Past Surgical History  Procedure Laterality Date  . Breast biopsy     Past Medical History  Diagnosis Date  . DEPRESSIVE DISORDER 07/26/2007  . UTI 05/11/2007  . Lump or mass in breast 11/14/2008  . 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 Cervicalgia 07/26/2007  . BACK PAIN 07/26/2007  . Palpitations 09/21/2008  . Grave's disease     hx of Grave's Disease/Hyperthyroidism  . Hx of migraines   . Anxiety 06/02/2010  . Insomnia 06/02/2010  . 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 Lumbar degenerative disc disease 01/20/2011  . Hyperlipidemia 12/17/2011   BP 118/88  Pulse 80  Resp 14  Ht 5\' 6"  (1.676 m)  Wt 152 lb (68.947 kg)  BMI 24.55 kg/m2  SpO2 98%  LMP 04/04/2012     Review of Systems  HENT: Positive for neck pain.   Musculoskeletal: Positive for back pain.  Neurological: Positive for weakness.  Psychiatric/Behavioral: The patient is nervous/anxious.   All other systems reviewed and are negative.       Objective:   Physical Exam  Constitutional: She  is oriented to person, place, and time. She appears well-developed and well-nourished.  HENT:  Head: Normocephalic.  Neck: Neck supple.  Musculoskeletal: She exhibits tenderness.  Neurological: She is alert and oriented to person, place, and time.  Skin: Skin is warm and dry.  Psychiatric: She has a normal mood and affect.    Symmetric normal motor tone is noted throughout, except increased muscle tone in left trapezius descendence and levator scapulae on the left. Normal muscle bulk. Muscle testing reveals 5/5 muscle strength of the upper extremity, and 5/5 of the lower extremity. Full range of motion in upper and lower extremities. ROM of C-spine is not restricted. Fine  motor movements are normal in both hands. Sensory is intact and symmetric to light touch, pinprick and proprioception. DTR in the upper and lower extremity are present and symmetric 2+. No clonus is noted.  Patient arises from chair without difficulty. Narrow based gait with normal arm swing bilateral . Head in protracted position.      Assessment & Plan:  This is a 52 year old female with 1.Cervical spondylosis 2.Neck pain, on the left side today 3.Lumbar spondylosis 4.Migraine Plan : Educated patient about good posture, showede her some exercises and tricks to improve her posture, showed her some McKenzie exercises. Prescribed Robaxin 500mg  tid , prn muscle pain/spasm Will order [facility] PT, will find out the name of the PT, who is a Stage manager, and order PT, and send her to him , on Monday. Patient did not have MBB, at last visit as scheduled, she only had TPI. Follow up in one month.

## 2012-04-22 NOTE — Patient Instructions (Signed)
Try to find the McKenzie booklet "Treat your own neck", only do the exercises I showed you today.

## 2012-05-04 ENCOUNTER — Ambulatory Visit: Payer: BC Managed Care – PPO

## 2012-05-16 ENCOUNTER — Encounter: Payer: Self-pay | Admitting: Physical Medicine and Rehabilitation

## 2012-05-16 ENCOUNTER — Encounter: Payer: Self-pay | Admitting: Internal Medicine

## 2012-05-16 ENCOUNTER — Telehealth: Payer: Self-pay | Admitting: Internal Medicine

## 2012-05-16 ENCOUNTER — Encounter
Payer: BC Managed Care – PPO | Attending: Physical Medicine and Rehabilitation | Admitting: Physical Medicine and Rehabilitation

## 2012-05-16 VITALS — BP 115/64 | HR 65 | Resp 14 | Ht 66.0 in | Wt 152.0 lb

## 2012-05-16 DIAGNOSIS — M47812 Spondylosis without myelopathy or radiculopathy, cervical region: Secondary | ICD-10-CM

## 2012-05-16 DIAGNOSIS — M542 Cervicalgia: Secondary | ICD-10-CM

## 2012-05-16 DIAGNOSIS — G8929 Other chronic pain: Secondary | ICD-10-CM

## 2012-05-16 MED ORDER — OXYCODONE HCL 10 MG PO TB12: 10.0000 mg | ORAL_TABLET | Freq: Two times a day (BID) | ORAL | Status: DC | PRN

## 2012-05-16 NOTE — Telephone Encounter (Signed)
Done hardcopy to robin, per pt email request

## 2012-05-16 NOTE — Telephone Encounter (Signed)
Called the patient informed to pickup hardcopy at the front desk. 

## 2012-05-16 NOTE — Progress Notes (Signed)
Subjective:    Patient ID: Cassandra Gallagher, female    DOB: [birth year], 52 y.o.   MRN: [id number]  HPI The patient is a 52 year old female, who presents with neck pain, worse on the left . The symptoms started years ago. The patient complains about mild to moderate pain in her left trapezius and levator scapulae . Patient denies any radiating symptoms into he UEs. Patient also complains about headaches . Applying heat, taking medications , changing positions alleviate the symptoms. Prolonged sitting, staying in one position aggrevates the symptoms. The patient grades her pain as a 1/10. She reports, that the Robaxin and posture exercises I showed her at the last visit have helped her. She states, that she can sleep through the night now, most of the time without waking up because of her pain.   Pain Inventory Average Pain 5 Pain Right Now 1 My pain is sharp, burning and aching  In the last 24 hours, has pain interfered with the following? General activity n/a Relation with others n/a Enjoyment of life n/a What TIME of day is your pain at its worst? morning and evening Sleep (in general) Fair  Pain is worse with: unsure Pain improves with: heat/ice, medication and injections Relief from Meds: no answer  Mobility walk without assistance how many minutes can you walk? patient wrote dont know ability to climb steps?  yes do you drive?  yes Do you have any goals in this area?  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  no  Physicians involved in your care Any changes since last visit?  no   Family History  Problem Relation Age of Onset  . Hypertension Mother    History   Social History  . Marital Status: Married    Spouse Name: N/A    Number of Children: 2  . Years of Education: N/A   Occupational History  . Production designer, theatre/television/film   Social History Main Topics  . Smoking  status: Former Games developer  . Smokeless tobacco: Never Used     Comment: smoked for 6 years, quit 15 years ago  . Alcohol Use: No  . Drug Use: No  . Sexually Active: None   Other Topics Concern  . None   Social History Narrative   2nd child born recently in September 2010.   Married, one child from previous marriage lives in [place]   Past Surgical History  Procedure Laterality Date  . Breast biopsy     Past Medical History  Diagnosis Date  . DEPRESSIVE DISORDER 07/26/2007  . UTI 05/11/2007  . Lump or mass in breast 11/14/2008  . 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 Cervicalgia 07/26/2007  . BACK PAIN 07/26/2007  . Palpitations 09/21/2008  . Grave's disease     hx of Grave's Disease/Hyperthyroidism  . Hx of migraines   . Anxiety 06/02/2010  . Insomnia 06/02/2010  . 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 Lumbar degenerative disc disease 01/20/2011  . Hyperlipidemia 12/17/2011   BP 115/64  Pulse 65  Resp 14  Ht 5\' 6"  (1.676 m)  Wt 152 lb (68.947 kg)  BMI 24.55 kg/m2  SpO2 97%     Review of Systems  HENT: Positive for neck pain.   Musculoskeletal: Positive for back pain.  Psychiatric/Behavioral: The patient is nervous/anxious.   All other systems reviewed and are negative.  Objective:   Physical Exam Constitutional: She is oriented to person, place, and time. She appears well-developed and well-nourished.  HENT:  Head: Normocephalic.  Neck: Neck supple.  Musculoskeletal: She exhibits tenderness at left levator scapulae insertion.  Neurological: She is alert and oriented to person, place, and time.  Skin: Skin is warm and dry.  Psychiatric: She has a normal mood and affect.  Symmetric normal motor tone is noted throughout, except increased muscle tone in left trapezius descendence and levator scapulae on the left. Normal muscle bulk. Muscle testing reveals 5/5 muscle strength of the upper extremity, and 5/5 of the lower extremity. Full range of  motion in upper and lower extremities. ROM of C-spine is not restricted. Fine motor movements are normal in both hands.  Sensory is intact and symmetric to light touch, pinprick and proprioception.  DTR in the upper and lower extremity are present and symmetric 2+. No clonus is noted.  Patient arises from chair without difficulty. Narrow based gait with normal arm swing bilateral .  Head in protracted position.        Assessment & Plan:  This is a 52 year old female with  1.Cervical spondylosis  2.Neck pain, on the left side today  3.Lumbar spondylosis  4.Migraine  Plan :  Educated patient about good posture, showed her some exercises and tricks to improve her posture, showed her some McKenzie exercises. She has been doing these, and her symptoms have improved immensely. Prescribed Robaxin 500mg  tid , prn muscle pain/spasm at last visit, which has also given her more relief.  Ordered [facility] PT, to see Thalia Bloodgood, who is a Stage manager.   Follow up in one month.

## 2012-05-16 NOTE — Patient Instructions (Signed)
Continue with your posture training, start with your PT.

## 2012-05-31 ENCOUNTER — Ambulatory Visit: Payer: BC Managed Care – PPO | Admitting: Physical Medicine & Rehabilitation

## 2012-06-15 ENCOUNTER — Encounter: Payer: Self-pay | Admitting: Internal Medicine

## 2012-06-15 MED ORDER — OXYCODONE HCL 10 MG PO TB12: 10.0000 mg | ORAL_TABLET | Freq: Two times a day (BID) | ORAL | Status: DC | PRN

## 2012-06-15 MED ORDER — RIZATRIPTAN BENZOATE 10 MG PO TBDP: 10.0000 mg | ORAL_TABLET | ORAL | Status: DC | PRN

## 2012-06-15 NOTE — Telephone Encounter (Signed)
Done hardcopy to [facility]  

## 2012-07-08 ENCOUNTER — Encounter: Payer: Self-pay | Admitting: Physical Medicine & Rehabilitation

## 2012-07-08 ENCOUNTER — Encounter: Payer: Self-pay | Admitting: Internal Medicine

## 2012-07-08 ENCOUNTER — Telehealth: Payer: Self-pay | Admitting: *Deleted

## 2012-07-08 DIAGNOSIS — E785 Hyperlipidemia, unspecified: Secondary | ICD-10-CM

## 2012-07-08 NOTE — Telephone Encounter (Signed)
See Dr Wynn Banker message below

## 2012-07-08 NOTE — Telephone Encounter (Signed)
This would be ok, as it has been over 3 mo since last done; I have place order for you to come by at your convenience. thanks

## 2012-07-08 NOTE — Telephone Encounter (Signed)
From my chart message: Dr Larna Daughters I have an appointment with [name] on June 6th. However, could I make an appointment for facet joint injections in my neck repeated as soon as possible? For about three weeks my neck was quite a bit better. But for the last month it is really painful again. Now I have moderate to severe neck pain and headaches every single day and feel I am losing my mind. Sincerely Cassandra Gallagher

## 2012-07-08 NOTE — Telephone Encounter (Signed)
Please schedule pt 30 min for Cervical MBB

## 2012-07-11 ENCOUNTER — Encounter: Payer: Self-pay | Admitting: Internal Medicine

## 2012-07-11 ENCOUNTER — Telehealth: Payer: Self-pay

## 2012-07-11 MED ORDER — HYDROCODONE-ACETAMINOPHEN 7.5-325 MG PO TABS: 1.0000 | ORAL_TABLET | Freq: Two times a day (BID) | ORAL | Status: DC | PRN

## 2012-07-11 NOTE — Telephone Encounter (Signed)
Ok for bid prn vicodin, should be fine to take this as needed for "extra" as many pt's do this without problems, and the oxycontin is just not recommended for tid use  Done hardcopy to [employer]

## 2012-07-11 NOTE — Telephone Encounter (Signed)
Message copied by Pincus Sanes on Mon Jul 11, 2012  2:26 PM ------      Message from: Corwin Levins      Created: Mon Jul 11, 2012  1:22 PM      Regarding: RE: Your message may not be read      Contact: [phone]         Apparently failure of mychart messaging            Zella Ball to let pt know            ----- Message -----         From: Generic Mychart         Sent: 07/11/2012   1:15 PM           To: Corwin Levins, MD      Subject: Your message may not be read                                ----- Delivery failure of internet email alert          ----- Failed to log attempted delivery of internet email alert       Tickler type: Message      Message Id(WMG): [id number]      SMTP Response: 410      Patient: Cassandra Gallagher,Cassandra Gallagher([id number])      Recipient: ()      Internet alert email: [email or web address]               ----- Original WMG message to the patient -----      Sent: 07/11/2012  1:11 PM      From: Corwin Levins, MD      To: [name]      Message Type: Patient Medical Advice Request      Subject: RE: Non-Urgent Medical Question      Sorry, I dont feel comfortable with that. Please only take medication as prescribed, incluiding the vicodin for breakthrough pain, though i could consider making that twice per day                  ----- Message -----         From: [name]         Sent: 07/11/2012  9:22 AM EDT           To: Oliver Barre, MD      Subject: Non-Urgent Medical Question            Dr Jonny Ruiz      Could I this ONE time only refill prescription early for : oxyCODONE 10 MG 12 hr tablet       I have moderate to severe neck pain and headaches everyday. I have been taking an extra pill late at night to try to sleep better. I  have an appointment for JUNE 24 with Dr Larna Daughters for facet joint injects in cervical spine. This should improve things.      Please let me know. Thank you.       Scarlette Calico       ------

## 2012-07-11 NOTE — Telephone Encounter (Signed)
Sorry, I dont feel comfortable with that. Please only take medication as prescribed, incluiding the vicodin for breakthrough pain, though i could consider making that twice per day

## 2012-07-11 NOTE — Telephone Encounter (Signed)
Called the patient informed and faxed hardcopy to [facility]

## 2012-07-11 NOTE — Telephone Encounter (Signed)
Informed the patient of MD instructions.  She did state if she decides to increase the vicodin she will run out very soon and did not know what to do about that.  She is concerned to increase this medication as does not want to take at all, but states the pain has been really bad and hopefully will get better after the injections.  Please advise as she does want advisement on the best thing to do at this time.

## 2012-07-15 ENCOUNTER — Encounter: Payer: Self-pay | Admitting: Physical Medicine and Rehabilitation

## 2012-07-15 ENCOUNTER — Other Ambulatory Visit: Payer: Self-pay | Admitting: Obstetrics & Gynecology

## 2012-07-15 ENCOUNTER — Encounter
Payer: BC Managed Care – PPO | Attending: Physical Medicine and Rehabilitation | Admitting: Physical Medicine and Rehabilitation

## 2012-07-15 VITALS — BP 131/83 | HR 63 | Resp 16 | Ht 66.0 in | Wt 152.0 lb

## 2012-07-15 DIAGNOSIS — M47817 Spondylosis without myelopathy or radiculopathy, lumbosacral region: Secondary | ICD-10-CM | POA: Insufficient documentation

## 2012-07-15 DIAGNOSIS — M47812 Spondylosis without myelopathy or radiculopathy, cervical region: Secondary | ICD-10-CM | POA: Insufficient documentation

## 2012-07-15 DIAGNOSIS — G43909 Migraine, unspecified, not intractable, without status migrainosus: Secondary | ICD-10-CM | POA: Insufficient documentation

## 2012-07-15 NOTE — Progress Notes (Signed)
Subjective:    Patient ID: Cassandra Gallagher, female    DOB: 1960/05/16, 52 y.o.   MRN: [id number]  HPI The patient is a 52 year old female, who presents with neck pain, worse on the left . The symptoms started years ago. The patient complains about mild to moderate pain in her left trapezius and levator scapulae . Patient denies any radiating symptoms into he UEs. Patient also complains about headaches . Applying heat, taking medications , changing positions alleviate the symptoms. Prolonged sitting, staying in one position aggrevates the symptoms. The patient grades her pain as a 3/10. She reports, that the Robaxin and posture exercises I showed her at the last visit have helped her some. She states, that she can not sleep through the night now, her pain has increased.She reports that she has an appointment with Dr. Doroteo Bradford in a couple of weeks for C-spine injections.  Pain Inventory Average Pain 7 Pain Right Now no answer My pain is constant, sharp and aching  In the last 24 hours, has pain interfered with the following? General activity na Relation with others na Enjoyment of life na What TIME of day is your pain at its worst? na Sleep (in general) NA  Pain is worse with: unsure Pain improves with: heat/ice, therapy/exercise, medication and injections Relief from Meds: 5  Mobility ability to climb steps?  yes do you drive?  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  no  Physicians involved in your care Any changes since last visit?  no   Family History  Problem Relation Age of Onset  . Hypertension Mother    History   Social History  . Marital Status: Married    Spouse Name: N/A    Number of Children: 2  . Years of Education: N/A   Occupational History  . Production designer, theatre/television/film   Social History Main Topics  . Smoking status: Former Games developer  .  Smokeless tobacco: Never Used     Comment: smoked for 6 years, quit 15 years ago  . Alcohol Use: No  . Drug Use: No  . Sexually Active: None   Other Topics Concern  . None   Social History Narrative   2nd child born recently in September 2010.   Married, one child from previous marriage lives in [place]   Past Surgical History  Procedure Laterality Date  . Breast biopsy     Past Medical History  Diagnosis Date  . DEPRESSIVE DISORDER 07/26/2007  . UTI 05/11/2007  . Lump or mass in breast 11/14/2008  . 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 Cervicalgia 07/26/2007  . BACK PAIN 07/26/2007  . Palpitations 09/21/2008  . Grave's disease     hx of Grave's Disease/Hyperthyroidism  . Hx of migraines   . Anxiety 06/02/2010  . Insomnia 06/02/2010  . 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 Lumbar degenerative disc disease 01/20/2011  . Hyperlipidemia 12/17/2011   BP 131/83  Pulse 63  Resp 16  Ht 5\' 6"  (1.676 m)  Wt 152 lb (68.947 kg)  BMI 24.55 kg/m2  SpO2 98%     Review of Systems  Neurological:       Spasms  Psychiatric/Behavioral: Positive for dysphoric mood and agitation.  All other systems reviewed and are negative.       Objective:   Physical Exam Constitutional: She is oriented to person,  place, and time. She appears well-developed and well-nourished.  HENT:  Head: Normocephalic.  Neck: Neck supple.  Musculoskeletal: She exhibits tenderness at left levator scapulae insertion.  Neurological: She is alert and oriented to person, place, and time.  Skin: Skin is warm and dry.  Psychiatric: She has a normal mood and affect.  Symmetric normal motor tone is noted throughout, except increased muscle tone in left trapezius descendence and levator scapulae on the left. Normal muscle bulk. Muscle testing reveals 5/5 muscle strength of the upper extremity, and 5/5 of the lower extremity. Full range of motion in upper and lower extremities. ROM of C-spine is not  restricted. Fine motor movements are normal in both hands.  Sensory is intact and symmetric to light touch, pinprick and proprioception.  DTR in the upper and lower extremity are present and symmetric 2+. No clonus is noted.  Patient arises from chair without difficulty. Narrow based gait with normal arm swing bilateral .  Head in protracted position.        Assessment & Plan:  This is a 52 year old female with  1.Cervical spondylosis  2.Neck pain, on the left side today  3.Lumbar spondylosis  4.Migraine  Plan :  Educated patient about good posture, showed her some exercises and tricks to improve her posture, showed her some [facility]enzie exercises. She has been doing these, and her symptoms have improved some.  Prescribed Robaxin 500mg  tid , prn muscle pain/spasm at last visit, which has also given her more relief.  Ordered [facility] PT, to see Thalia Bloodgood, who is a Stage manager, she only has seen him once so far, advised her to follow up with him again, advised her to ask about cranio-sacral therapy.  Follow up with Dr. Doroteo Bradford for C-spine injection .

## 2012-07-15 NOTE — Patient Instructions (Signed)
Continue with keeping a good posture, and your exercise program

## 2012-07-19 ENCOUNTER — Encounter (HOSPITAL_COMMUNITY): Payer: Self-pay | Admitting: Pharmacy Technician

## 2012-07-19 ENCOUNTER — Encounter: Payer: Self-pay | Admitting: Internal Medicine

## 2012-07-20 ENCOUNTER — Encounter (HOSPITAL_COMMUNITY): Payer: Self-pay | Admitting: Anesthesiology

## 2012-07-20 ENCOUNTER — Encounter (HOSPITAL_COMMUNITY)
Admission: RE | Disposition: A | Discharge status: Home / Self Care | Payer: Self-pay | Source: Ambulatory Visit | Attending: Obstetrics & Gynecology

## 2012-07-20 ENCOUNTER — Ambulatory Visit (HOSPITAL_COMMUNITY): Payer: BC Managed Care – PPO | Admitting: Anesthesiology

## 2012-07-20 ENCOUNTER — Ambulatory Visit (HOSPITAL_COMMUNITY)
Admission: RE | Admit: 2012-07-20 | Discharge: 2012-07-20 | Disposition: A | Discharge status: Home / Self Care | Payer: BC Managed Care – PPO | Source: Ambulatory Visit | Attending: Obstetrics & Gynecology | Admitting: Obstetrics & Gynecology

## 2012-07-20 ENCOUNTER — Encounter (HOSPITAL_COMMUNITY): Payer: Self-pay | Admitting: *Deleted

## 2012-07-20 DIAGNOSIS — N950 Postmenopausal bleeding: Secondary | ICD-10-CM | POA: Insufficient documentation

## 2012-07-20 DIAGNOSIS — N840 Polyp of corpus uteri: Secondary | ICD-10-CM | POA: Insufficient documentation

## 2012-07-20 HISTORY — PX: DILITATION & CURRETTAGE/HYSTROSCOPY WITH VERSAPOINT RESECTION: SHX5571

## 2012-07-20 HISTORY — PX: HYSTEROSCOPY WITH D & C: SHX1775

## 2012-07-20 LAB — CBC
MCH: 31.4 pg (ref 26.0–34.0)
MCHC: 34.1 g/dL (ref 30.0–36.0)
Platelets: 245 10*3/uL (ref 150–400)
RDW: 11.9 % (ref 11.5–15.5)

## 2012-07-20 SURGERY — DILATATION & CURETTAGE/HYSTEROSCOPY WITH VERSAPOINT RESECTION
Anesthesia: General | Site: Uterus | Wound class: Clean Contaminated

## 2012-07-20 MED ORDER — PROMETHAZINE HCL 25 MG/ML IJ SOLN: 6.2500 mg | INTRAMUSCULAR | Status: DC | PRN

## 2012-07-20 MED ORDER — KETOROLAC TROMETHAMINE 30 MG/ML IJ SOLN: 15.0000 mg | Freq: Once | INTRAMUSCULAR | Status: DC | PRN

## 2012-07-20 MED ORDER — LIDOCAINE HCL (CARDIAC) 20 MG/ML IV SOLN
INTRAVENOUS | Status: DC | PRN
  Administered 2012-07-20: 50 mg via INTRAVENOUS

## 2012-07-20 MED ORDER — KETOROLAC TROMETHAMINE 30 MG/ML IJ SOLN
INTRAMUSCULAR | Status: DC | PRN
  Administered 2012-07-20: 30 mg via INTRAVENOUS

## 2012-07-20 MED ORDER — SCOPOLAMINE 1 MG/3DAYS TD PT72
MEDICATED_PATCH | TRANSDERMAL | Status: AC
  Administered 2012-07-20: 1.5 mg via TRANSDERMAL
  Filled 2012-07-20: qty 1

## 2012-07-20 MED ORDER — FENTANYL CITRATE 0.05 MG/ML IJ SOLN
INTRAMUSCULAR | Status: AC
  Administered 2012-07-20: 25 ug via INTRAVENOUS
  Filled 2012-07-20: qty 2

## 2012-07-20 MED ORDER — SODIUM CHLORIDE 0.9 % IR SOLN
Status: DC | PRN
  Administered 2012-07-20: 3000 mL

## 2012-07-20 MED ORDER — OXYCODONE HCL 10 MG PO TB12: 10.0000 mg | ORAL_TABLET | Freq: Two times a day (BID) | ORAL | Status: DC | PRN

## 2012-07-20 MED ORDER — MIDAZOLAM HCL 2 MG/2ML IJ SOLN: 0.5000 mg | Freq: Once | INTRAMUSCULAR | Status: DC | PRN

## 2012-07-20 MED ORDER — SILVER NITRATE-POT NITRATE 75-25 % EX MISC
CUTANEOUS | Status: DC | PRN
  Administered 2012-07-20: 2

## 2012-07-20 MED ORDER — PROPOFOL 10 MG/ML IV EMUL
INTRAVENOUS | Status: AC
  Filled 2012-07-20: qty 20

## 2012-07-20 MED ORDER — MEPERIDINE HCL 25 MG/ML IJ SOLN: 6.2500 mg | INTRAMUSCULAR | Status: DC | PRN

## 2012-07-20 MED ORDER — LIDOCAINE HCL (CARDIAC) 20 MG/ML IV SOLN
INTRAVENOUS | Status: AC
  Filled 2012-07-20: qty 5

## 2012-07-20 MED ORDER — CHLOROPROCAINE HCL 1 % IJ SOLN
INTRAMUSCULAR | Status: AC
  Filled 2012-07-20: qty 30

## 2012-07-20 MED ORDER — FENTANYL CITRATE 0.05 MG/ML IJ SOLN
INTRAMUSCULAR | Status: DC | PRN
  Administered 2012-07-20 (×2): 50 ug via INTRAVENOUS

## 2012-07-20 MED ORDER — FENTANYL CITRATE 0.05 MG/ML IJ SOLN
INTRAMUSCULAR | Status: AC
  Filled 2012-07-20: qty 5

## 2012-07-20 MED ORDER — SCOPOLAMINE 1 MG/3DAYS TD PT72: 1.0000 | MEDICATED_PATCH | TRANSDERMAL | Status: DC

## 2012-07-20 MED ORDER — MIDAZOLAM HCL 5 MG/5ML IJ SOLN
INTRAMUSCULAR | Status: DC | PRN
  Administered 2012-07-20: 2 mg via INTRAVENOUS

## 2012-07-20 MED ORDER — ONDANSETRON HCL 4 MG/2ML IJ SOLN
INTRAMUSCULAR | Status: AC
  Filled 2012-07-20: qty 2

## 2012-07-20 MED ORDER — CEFAZOLIN SODIUM-DEXTROSE 2-3 GM-% IV SOLR
INTRAVENOUS | Status: AC
  Filled 2012-07-20: qty 50

## 2012-07-20 MED ORDER — MIDAZOLAM HCL 2 MG/2ML IJ SOLN
INTRAMUSCULAR | Status: AC
  Filled 2012-07-20: qty 2

## 2012-07-20 MED ORDER — ONDANSETRON HCL 4 MG/2ML IJ SOLN
INTRAMUSCULAR | Status: DC | PRN
  Administered 2012-07-20: 4 mg via INTRAVENOUS

## 2012-07-20 MED ORDER — CHLOROPROCAINE HCL 1 % IJ SOLN
INTRAMUSCULAR | Status: DC | PRN
  Administered 2012-07-20: 20 mL

## 2012-07-20 MED ORDER — PROPOFOL 10 MG/ML IV BOLUS
INTRAVENOUS | Status: DC | PRN
  Administered 2012-07-20: 200 mg via INTRAVENOUS

## 2012-07-20 MED ORDER — DEXAMETHASONE SODIUM PHOSPHATE 10 MG/ML IJ SOLN
INTRAMUSCULAR | Status: DC | PRN
  Administered 2012-07-20: 5 mg via INTRAVENOUS

## 2012-07-20 MED ORDER — LACTATED RINGERS IV SOLN
INTRAVENOUS | Status: DC
  Administered 2012-07-20 (×2): via INTRAVENOUS

## 2012-07-20 MED ORDER — CEFAZOLIN SODIUM-DEXTROSE 2-3 GM-% IV SOLR
2.0000 g | INTRAVENOUS | Status: AC
  Administered 2012-07-20: 2 g via INTRAVENOUS

## 2012-07-20 MED ORDER — FENTANYL CITRATE 0.05 MG/ML IJ SOLN
25.0000 ug | INTRAMUSCULAR | Status: DC | PRN
  Administered 2012-07-20: 25 ug via INTRAVENOUS

## 2012-07-20 MED ORDER — KETOROLAC TROMETHAMINE 30 MG/ML IJ SOLN
INTRAMUSCULAR | Status: AC
  Filled 2012-07-20: qty 1

## 2012-07-20 SURGICAL SUPPLY — 17 items
CANISTER SUCTION 2500CC (MISCELLANEOUS) ×4 IMPLANT
CATH ROBINSON RED A/P 16FR (CATHETERS) ×2 IMPLANT
CLOTH BEACON ORANGE TIMEOUT ST (SAFETY) ×2 IMPLANT
CONTAINER PREFILL 10% NBF 60ML (FORM) ×4 IMPLANT
DRESSING TELFA 8X3 (GAUZE/BANDAGES/DRESSINGS) ×2 IMPLANT
ELECTRODE RT ANGLE VERSAPOINT (CUTTING LOOP) ×2 IMPLANT
GLOVE BIO SURGEON STRL SZ 6.5 (GLOVE) ×2 IMPLANT
GLOVE BIOGEL PI IND STRL 7.0 (GLOVE) ×3 IMPLANT
GLOVE BIOGEL PI INDICATOR 7.0 (GLOVE) ×3
GLOVE ECLIPSE 7.0 STRL STRAW (GLOVE) ×2 IMPLANT
GLOVE SURG SS PI 6.5 STRL IVOR (GLOVE) ×2 IMPLANT
GLOVE SURG SS PI 7.0 STRL IVOR (GLOVE) ×4 IMPLANT
GOWN STRL REIN XL XLG (GOWN DISPOSABLE) ×6 IMPLANT
PACK HYSTEROSCOPY LF (CUSTOM PROCEDURE TRAY) ×2 IMPLANT
PAD OB MATERNITY 4.3X12.25 (PERSONAL CARE ITEMS) ×2 IMPLANT
TOWEL OR 17X24 6PK STRL BLUE (TOWEL DISPOSABLE) ×6 IMPLANT
WATER STERILE IRR 1000ML POUR (IV SOLUTION) ×2 IMPLANT

## 2012-07-20 NOTE — Discharge Summary (Signed)
  Physician Discharge Summary  Patient ID: Cassandra Gallagher MRN: [id number] DOB/AGE: 11-04-1960 52 y.o.  Admit date: 07/20/2012 Discharge date: 07/20/2012  Admission Diagnoses: endometrial polyp  Discharge Diagnoses: endometrial polyp        Active Problems:   * No active hospital problems. *   Discharged Condition: good  Hospital Course: Outpatient  Consults: None  Treatments: surgery: Hysteroscopy, Versapoint resection of endometrial polyp, D+C  Disposition:  Home   Future Appointments Provider Department Dept Phone   08/02/2012 10:00 AM Erick Colace[phone]   Leave all valuables at home.       Medication List    TAKE these medications       CALCIUM + D PO  Take 1 tablet by mouth 2 (two) times daily.     clonazePAM 0.5 MG tablet  Commonly known as:  KLONOPIN  Take 0.5 mg by mouth daily as needed for anxiety.     COMBIPATCH 0.05-0.14 MG/DAY  Generic drug:  estradiol-norethindrone  Place 1 patch onto the skin Every third day.     HYDROcodone-acetaminophen 7.5-325 MG per tablet  Commonly known as:  NORCO  Take 1 tablet by mouth 2 (two) times daily as needed for pain.     methocarbamol 500 MG tablet  Commonly known as:  ROBAXIN  Take 500 mg by mouth 3 (three) times daily as needed (for muscle spasms).     multivitamin with minerals Tabs  Take 1 tablet by mouth daily.     NAPRELAN 375 MG Tb24  Generic drug:  Naproxen Sodium  Take 1 tablet by mouth 2 (two) times daily as needed (for pain).     oxyCODONE 10 MG 12 hr tablet  Commonly known as:  OXYCONTIN  Take 1 tablet (10 mg total) by mouth every 12 (twelve) hours as needed for pain. - to fill July 20, 2012     RED YEAST RICE PO  Take 1 tablet by mouth daily.     rizatriptan 10 MG disintegrating tablet  Commonly known as:  MAXALT-MLT  Take 1 tablet (10 mg total) by mouth as needed for migraine. May repeat in 2 hours if needed           Follow-up  Information   Follow up with Yanin Muhlestein,MARIE-LYNE, MD In 3 weeks.   Contact information:   [address] [place] [address] [phone]       Signed: Genia Del, MD 07/20/2012, 3:14 PM

## 2012-07-20 NOTE — H&P (Deleted)
07/20/2012  3:06 PM  PATIENT: Devlyn M Tacker 52 y.o. female  PRE-OPERATIVE DIAGNOSIS: endometrial polyp  POST-OPERATIVE DIAGNOSIS: endometrial polyp  PROCEDURE: Procedure(s):  DILATATION & CURETTAGE/HYSTEROSCOPY WITH VERSAPOINT RESECTION  SURGEON: Surgeon(s):  Marie-Lyne Debbi Strandberg, MD  ASSISTANTS: none  ANESTHESIA: local and general  PROCEDURE: Under general anesthesia with laryngeal mask the patient is in lithotomy position. She is prepped with Betadine on the suprapubic, vulvar and vaginal areas. She is draped as usual. The vaginal exam reveals an anteverted uterus normal volume no adnexal mass. A speculum is inserted in the vagina. The anterior lip of the cervix is grasped with a tenaculum. The hysterometry is at 8 cm. Dilation of the cervix with Hegar dilators up to #35 without difficulty. The operative hysteroscope was introduced and the intrauterine cavity with the versa point loupe. We visualized intrauterine cavity and note a probable endometrial polyp about 1-1.5 cm in diameter. It is located to the left fundal area. The base of the polyp was resected easily with the versa point loupe. The polyp was removed from the intrauterine cavity. We then proceed with a systematic endometrial curettage with a sharp curet. The endometrial cavity was curetted on all surfaces. Both specimens are sent together to pathology. We then go back with the hysteroscope. We note a normal intrauterine cavity without any lesion. Hemostasis is adequate. Pictures were taken before resection and after resection. The hysteroscope was removed. The tenaculum was removed from the cervix. Silver nitrate is used at that level complete hemostasis. The speculum was then removed. The patient is brought to recovery room in good and stable status.  ESTIMATED BLOOD LOSS: 10 cc  Fluid deficit 0 cc   Intake/Output Summary (Last 24 hours) at 07/20/12 1506 Last data filed at 07/20/12 1454   Gross per 24 hour   Intake  1000 ml   Output   85 ml   Net  915 ml    BLOOD ADMINISTERED:none  LOCAL MEDICATIONS USED: Nesacaine 1%, Amount: 20 ml  SPECIMEN: Source of Specimen: Endometrial polyp/endometrial curettings  DISPOSITION OF SPECIMEN: PATHOLOGY  COUNTS: YES  PLAN OF CARE: Transfer to PACU  Marie-Lyne Keyra Virella MD 07/20/2012 at 3:08 pm     

## 2012-07-20 NOTE — H&P (Signed)
Cassandra Gallagher is an 52 y.o. female  RP:  IU lesion  Pertinent Gynecological History: Menses: post-menopausal Bleeding: post menopausal bleeding Contraception: post menopausal status Blood transfusions: none Sexually transmitted diseases: no past history Previous GYN Procedures: none  Last mammogram: normal  Last pap: normal  OB History: C/S twin   Menstrual History:  No LMP recorded. Patient is not currently having periods (Reason: Perimenopausal).    Past Medical History  Diagnosis Date  . DEPRESSIVE DISORDER 07/26/2007  . UTI 05/11/2007  . Lump or mass in breast 11/14/2008  . 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 Cervicalgia 07/26/2007  . BACK PAIN 07/26/2007  . Palpitations 09/21/2008  . Grave's disease     hx of Grave's Disease/Hyperthyroidism  . Hx of migraines   . Anxiety 06/02/2010  . Insomnia 06/02/2010  . 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 Lumbar degenerative disc disease 01/20/2011  . Hyperlipidemia 12/17/2011    Past Surgical History  Procedure Laterality Date  . Breast biopsy    . Cesarean section  10/29/2008    twins    Family History  Problem Relation Age of Onset  . Hypertension Mother     Social History:  reports that she has quit smoking. She has never used smokeless tobacco. She reports that she does not drink alcohol or use illicit drugs.  Allergies: No Known Allergies  Prescriptions prior to admission  Medication Sig Dispense Refill  . rizatriptan (MAXALT-MLT) 10 MG disintegrating tablet Take 1 tablet (10 mg total) by mouth as needed for migraine. May repeat in 2 hours if needed  10 tablet  5  . Calcium Carbonate-Vitamin D (CALCIUM + D PO) Take 1 tablet by mouth 2 (two) times daily.      . clonazePAM (KLONOPIN) 0.5 MG tablet Take 0.5 mg by mouth daily as needed for anxiety.      . COMBIPATCH 0.05-0.14 MG/DAY Place 1 patch onto the skin Every third day.      Marland Kitchen HYDROcodone-acetaminophen (NORCO) 7.5-325 MG per tablet Take 1  tablet by mouth 2 (two) times daily as needed for pain.  60 tablet  3  . methocarbamol (ROBAXIN) 500 MG tablet Take 500 mg by mouth 3 (three) times daily as needed (for muscle spasms).      . Multiple Vitamin (MULTIVITAMIN WITH MINERALS) TABS Take 1 tablet by mouth daily.      . Naproxen Sodium (NAPRELAN) 375 MG TB24 Take 1 tablet by mouth 2 (two) times daily as needed (for pain).       Marland Kitchen oxyCODONE (OXYCONTIN) 10 MG 12 hr tablet Take 1 tablet (10 mg total) by mouth every 12 (twelve) hours as needed for pain. - to fill July 20, 2012  60 tablet  0  . Red Yeast Rice Extract (RED YEAST RICE PO) Take 1 tablet by mouth daily.         Blood pressure 112/83, pulse 62, temperature 98.2 F (36.8 C), temperature source Oral, resp. rate 18, SpO2 99.00%.  Sonohysto:  IU lesion  Results for orders placed during the hospital encounter of 07/20/12 (from the past 24 hour(s))  CBC     Status: None   Collection Time    07/20/12  1:56 PM      Result Value Range   WBC 4.9  4.0 - 10.5 K/uL   RBC 4.42  3.87 - 5.11 MIL/uL   Hemoglobin 13.9  12.0 - 15.0 g/dL   HCT 54.0  98.1 - 19.1 %   MCV  92.3  78.0 - 100.0 fL   MCH 31.4  26.0 - 34.0 pg   MCHC 34.1  30.0 - 36.0 g/dL   RDW 78.2  95.6 - 21.3 %   Platelets 245  150 - 400 K/uL    No results found.  Assessment/Plan: IU lesion for [facility] Versapoint resection, D+C.  Surgery and risks reviewed.  Art Levan,MARIE-LYNE 07/20/2012, 2:12 PM

## 2012-07-20 NOTE — Transfer of Care (Signed)
Immediate Anesthesia Transfer of Care Note  Patient: Cassandra Gallagher  Procedure(s) Performed: Procedure(s): DILATATION & CURETTAGE/HYSTEROSCOPY WITH VERSAPOINT RESECTION (N/A)  Patient Location: PACU  Anesthesia Type:General  Level of Consciousness: awake  Airway & Oxygen Therapy: Patient Spontanous Breathing  Post-op Assessment: Report given to PACU RN  Post vital signs: stable  Filed Vitals:   07/20/12 1321  BP: 112/83  Pulse: 62  Temp: 36.8 C  Resp: 18    Complications: No apparent anesthesia complications

## 2012-07-20 NOTE — Telephone Encounter (Signed)
Done hardcopy to [facility]  

## 2012-07-20 NOTE — Anesthesia Preprocedure Evaluation (Signed)
Anesthesia Evaluation  Patient identified by MRN, date of birth, ID band Patient awake    Reviewed: Allergy & Precautions, H&P , Patient's Chart, lab work & pertinent test results, reviewed documented beta blocker date and time   History of Anesthesia Complications Negative for: history of anesthetic complications  Airway Mallampati: II TM Distance: >3 FB Neck ROM: full    Dental no notable dental hx.    Pulmonary neg pulmonary ROS,  breath sounds clear to auscultation  Pulmonary exam normal       Cardiovascular Exercise Tolerance: Good negative cardio ROS  Rhythm:regular Rate:Normal     Neuro/Psych  Headaches, PSYCHIATRIC DISORDERS negative neurological ROS  negative psych ROS   GI/Hepatic negative GI ROS, Neg liver ROS,   Endo/Other  negative endocrine ROSHyperthyroidism   Renal/GU negative Renal ROS     Musculoskeletal   Abdominal   Peds  Hematology negative hematology ROS (+)   Anesthesia Other Findings DEPRESSIVE DISORDER 07/26/2007   UTI 05/11/2007      Lump or mass in breast 11/14/2008   DEGENERATIVE DISC DISEASE, CERVICAL SPINE 06/07/2009      Cervicalgia 07/26/2007   BACK PAIN 07/26/2007      Palpitations 09/21/2008   Grave's disease   hx of Grave's Disease/Hyperthyroidism    Hx of migraines     Anxiety 06/02/2010      Insomnia 06/02/2010   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      Lumbar degenerative disc disease 01/20/2011   Hyperlipidemia 12/17/2011    Reproductive/Obstetrics negative OB ROS                           Anesthesia Physical Anesthesia Plan  ASA: II  Anesthesia Plan: General LMA   Post-op Pain Management:    Induction:   Airway Management Planned:   Additional Equipment:   Intra-op Plan:   Post-operative Plan:   Informed Consent: I have reviewed the patients History and Physical, chart, labs and discussed the procedure including the risks, benefits and  alternatives for the proposed anesthesia with the patient or authorized representative who has indicated his/her understanding and acceptance.   Dental Advisory Given  Plan Discussed with: CRNA, Surgeon and Anesthesiologist  Anesthesia Plan Comments:         Anesthesia Quick Evaluation

## 2012-07-20 NOTE — Anesthesia Postprocedure Evaluation (Signed)
  Anesthesia Post Note  Patient: Cassandra Gallagher  Procedure(s) Performed: Procedure(s) (LRB): DILATATION & CURETTAGE/HYSTEROSCOPY WITH VERSAPOINT RESECTION (N/A)  Anesthesia type: GA  Patient location: PACU  Post pain: Pain level controlled  Post assessment: Post-op Vital signs reviewed  Last Vitals:  Filed Vitals:   07/20/12 1515  BP: 122/75  Pulse: 58  Temp: 36.5 C  Resp: 16    Post vital signs: Reviewed  Level of consciousness: sedated  Complications: No apparent anesthesia complications

## 2012-07-21 ENCOUNTER — Encounter (HOSPITAL_COMMUNITY): Payer: Self-pay | Admitting: Obstetrics & Gynecology

## 2012-07-21 NOTE — Op Note (Signed)
07/20/2012  3:06 PM  PATIENT: Cassandra Gallagher 52 y.o. female  PRE-OPERATIVE DIAGNOSIS: endometrial polyp  POST-OPERATIVE DIAGNOSIS: endometrial polyp  PROCEDURE: Procedure(s):  DILATATION & CURETTAGE/HYSTEROSCOPY WITH VERSAPOINT RESECTION  SURGEON: Surgeon(s):  Genia Del, MD  ASSISTANTS: none  ANESTHESIA: local and general  PROCEDURE: Under general anesthesia with laryngeal mask the patient is in lithotomy position. She is prepped with Betadine on the suprapubic, vulvar and vaginal areas. She is draped as usual. The vaginal exam reveals an anteverted uterus normal volume no adnexal mass. A speculum is inserted in the vagina. The anterior lip of the cervix is grasped with a tenaculum. The hysterometry is at 8 cm. Dilation of the cervix with Hegar dilators up to #35 without difficulty. The operative hysteroscope was introduced and the intrauterine cavity with the versa point loupe. We visualized intrauterine cavity and note a probable endometrial polyp about 1-1.5 cm in diameter. It is located to the left fundal area. The base of the polyp was resected easily with the versa point loupe. The polyp was removed from the intrauterine cavity. We then proceed with a systematic endometrial curettage with a sharp curet. The endometrial cavity was curetted on all surfaces. Both specimens are sent together to pathology. We then go back with the hysteroscope. We note a normal intrauterine cavity without any lesion. Hemostasis is adequate. Pictures were taken before resection and after resection. The hysteroscope was removed. The tenaculum was removed from the cervix. Silver nitrate is used at that level complete hemostasis. The speculum was then removed. The patient is brought to recovery room in good and stable status.  ESTIMATED BLOOD LOSS: 10 cc  Fluid deficit 0 cc   Intake/Output Summary (Last 24 hours) at 07/20/12 1506 Last data filed at 07/20/12 1454   Gross per 24 hour   Intake  1000 ml   Output   85 ml   Net  915 ml    BLOOD ADMINISTERED:none  LOCAL MEDICATIONS USED: Nesacaine 1%, Amount: 20 ml  SPECIMEN: Source of Specimen: Endometrial polyp/endometrial curettings  DISPOSITION OF SPECIMEN: PATHOLOGY  COUNTS: YES  PLAN OF CARE: Transfer to PACU  Genia Del MD 07/20/2012 at 3:08 pm

## 2012-08-02 ENCOUNTER — Encounter: Payer: Self-pay | Admitting: Physical Medicine & Rehabilitation

## 2012-08-02 ENCOUNTER — Ambulatory Visit (HOSPITAL_BASED_OUTPATIENT_CLINIC_OR_DEPARTMENT_OTHER): Payer: BC Managed Care – PPO | Admitting: Physical Medicine & Rehabilitation

## 2012-08-02 VITALS — BP 110/69 | HR 71 | Resp 16 | Ht 66.0 in | Wt 152.0 lb

## 2012-08-02 DIAGNOSIS — M47812 Spondylosis without myelopathy or radiculopathy, cervical region: Secondary | ICD-10-CM

## 2012-08-02 NOTE — Progress Notes (Signed)
Left C-4-5-6 medial branch block under fluoroscopic guidance Indication is cervical spondylosis with pain during extension of the cervical spine as well as pain with palpation along the lateral neck. Informed consent was obtained after describing risks and benefits of the procedure with the patient. She gets only partial relief with narcotic analgesic medications. Her pain does interfere with sleep Patient placed in a right lateral decubitus position lateral neck was marked and prepped with Betadine skin wheal raised with 0.5 mL of 1% lidocaine using a 27-gauge 1/2 inch needle at each of 3 sites. Then a 25-gauge 1.5 inch needle was inserted under fluoroscopic guidance targeting the midpoint of the articular pillar first at C4 and C5 and C6. Once bone contact was made AP images confirmed proper location. Then 0.3 mL of 1% MPF lidocaine was injected into each of 3 sites. Patient tolerated procedure well. Return to clinic in 3 weeks for lumbar medial branch blocks  Right C-4-5-6 medial branch block under fluoroscopic guidance Indication is cervical spondylosis with pain during extension of the cervical spine as well as pain with palpation along the lateral neck. Informed consent was obtained after describing risks and benefits of the procedure with the patient. She gets only partial relief with narcotic analgesic medications. Her pain does interfere with sleep Patient placed in a left lateral decubitus position lateral neck was marked and prepped with Betadine skin wheal raised with 0.5 mL of 1% lidocaine using a 27-gauge 1/2 inch needle at each of 3 sites. Then a 25-gauge 1.5 inch needle was inserted under fluoroscopic guidance targeting the midpoint of the articular pillar first at C4 and C5 and C6. Once bone contact was made AP images confirmed proper location. Then 0.3 mL of 1% MPF lidocaine was injected into each of 3 sites. Patient tolerated procedure well.

## 2012-08-02 NOTE — Patient Instructions (Signed)
Return to clinic one month to report on the success of the injections. Can discuss further treatment options at that time. If you're not feeling much better after the injections I would recommend repeat MRI

## 2012-08-02 NOTE — Progress Notes (Signed)
  PROCEDURE RECORD [facility]   Name: Cassandra Gallagher DOB:15-Apr-1960 MRN: [id number]  Date:08/02/2012  Physician: Claudette Laws, MD    Nurse/CMA: Shumaker RN  Allergies: No Known Allergies  Consent Signed: yes  Is patient diabetic? no  CBG today? na  Pregnant: no LMP: No LMP recorded. Patient is not currently having periods (Reason: Perimenopausal). (age [age])  Anticoagulants: no Anti-inflammatory: no Antibiotics: no  Procedure:bilateral  cervical medial branch block Position: Left Lateral/ right lateral Start Time: 10:38 End Time: 10:59  Fluoro Time: 51 seconds  RN/CMA [employer] RN    Time 9:44 11:00    BP 110/69 143/90    Pulse 71 68    Respirations 16 14    O2 Sat 96 99    S/S 6 6    Pain Level 1 1     D/C home with sebastian, [name] & O X 3, D/C instructions reviewed, and sits independently.

## 2012-08-05 ENCOUNTER — Other Ambulatory Visit: Payer: Self-pay | Admitting: Internal Medicine

## 2012-08-05 NOTE — Telephone Encounter (Signed)
Done hardcopy to [facility]  

## 2012-08-05 NOTE — Telephone Encounter (Signed)
Faxed hardcopy to [facility]

## 2012-08-15 ENCOUNTER — Encounter: Payer: Self-pay | Admitting: Physical Medicine & Rehabilitation

## 2012-08-15 ENCOUNTER — Encounter: Payer: Self-pay | Admitting: Internal Medicine

## 2012-08-15 ENCOUNTER — Encounter: Payer: Self-pay | Admitting: Physical Medicine and Rehabilitation

## 2012-08-15 DIAGNOSIS — M542 Cervicalgia: Secondary | ICD-10-CM

## 2012-08-15 DIAGNOSIS — M25519 Pain in unspecified shoulder: Secondary | ICD-10-CM

## 2012-08-15 MED ORDER — OXYCODONE HCL 10 MG PO TB12: 10.0000 mg | ORAL_TABLET | Freq: Two times a day (BID) | ORAL | Status: DC | PRN

## 2012-08-15 NOTE — Telephone Encounter (Signed)
Ok for robin to let pharmacy know- ok to fill July 9

## 2012-08-16 ENCOUNTER — Encounter: Payer: Self-pay | Admitting: Internal Medicine

## 2012-08-16 ENCOUNTER — Telehealth: Payer: Self-pay | Admitting: *Deleted

## 2012-08-16 NOTE — Telephone Encounter (Signed)
Called the patient informed to pickup hardcopy at the front desk and called [facility] to inform ok to fill early per MD instructions July 9

## 2012-08-16 NOTE — Telephone Encounter (Signed)
They are needing to know if she has failed 2 other conservative treatments.  Per Colin Broach not she was prescribed Robaxin which has not provided significant relief.  She is a candidate for surgical or ESI tx. Clydie Braun also prescribed PT therapy [facility]). She has not had significant improvement.  By this they were able to give approval # [id number] valid today for 30 days.

## 2012-08-22 ENCOUNTER — Inpatient Hospital Stay: Admission: RE | Admit: 2012-08-22 | Payer: BC Managed Care – PPO | Source: Ambulatory Visit

## 2012-08-22 ENCOUNTER — Encounter: Payer: Self-pay | Admitting: Physical Medicine & Rehabilitation

## 2012-09-08 ENCOUNTER — Ambulatory Visit
Admission: RE | Admit: 2012-09-08 | Discharge: 2012-09-08 | Disposition: A | Discharge status: Home / Self Care | Payer: BC Managed Care – PPO | Source: Ambulatory Visit | Attending: Physical Medicine & Rehabilitation | Admitting: Physical Medicine & Rehabilitation

## 2012-09-08 DIAGNOSIS — M25519 Pain in unspecified shoulder: Secondary | ICD-10-CM

## 2012-09-08 DIAGNOSIS — M542 Cervicalgia: Secondary | ICD-10-CM

## 2012-09-15 ENCOUNTER — Encounter: Payer: Self-pay | Admitting: Internal Medicine

## 2012-09-15 ENCOUNTER — Encounter: Payer: BC Managed Care – PPO | Attending: Physical Medicine and Rehabilitation

## 2012-09-15 ENCOUNTER — Encounter: Payer: Self-pay | Admitting: Physical Medicine & Rehabilitation

## 2012-09-15 ENCOUNTER — Ambulatory Visit (HOSPITAL_BASED_OUTPATIENT_CLINIC_OR_DEPARTMENT_OTHER): Payer: BC Managed Care – PPO | Admitting: Physical Medicine & Rehabilitation

## 2012-09-15 VITALS — BP 109/68 | HR 75 | Resp 14 | Ht 66.0 in | Wt 151.2 lb

## 2012-09-15 DIAGNOSIS — M47812 Spondylosis without myelopathy or radiculopathy, cervical region: Secondary | ICD-10-CM | POA: Insufficient documentation

## 2012-09-15 DIAGNOSIS — G43709 Chronic migraine without aura, not intractable, without status migrainosus: Secondary | ICD-10-CM

## 2012-09-15 DIAGNOSIS — G8929 Other chronic pain: Secondary | ICD-10-CM

## 2012-09-15 DIAGNOSIS — M47817 Spondylosis without myelopathy or radiculopathy, lumbosacral region: Secondary | ICD-10-CM | POA: Insufficient documentation

## 2012-09-15 DIAGNOSIS — G43909 Migraine, unspecified, not intractable, without status migrainosus: Secondary | ICD-10-CM | POA: Insufficient documentation

## 2012-09-15 DIAGNOSIS — M5380 Other specified dorsopathies, site unspecified: Secondary | ICD-10-CM

## 2012-09-15 DIAGNOSIS — IMO0002 Reserved for concepts with insufficient information to code with codable children: Secondary | ICD-10-CM

## 2012-09-15 MED ORDER — OXYCODONE HCL 10 MG PO TB12: 10.0000 mg | ORAL_TABLET | Freq: Two times a day (BID) | ORAL | Status: DC | PRN

## 2012-09-15 NOTE — Patient Instructions (Addendum)
I suspect pain is related to upper cervical facet  Dr Ollen Bowl at [facility] and spine

## 2012-09-15 NOTE — Progress Notes (Signed)
Subjective:    Patient ID: Cassandra Gallagher, female    DOB: Jul 26, 1960, 52 y.o.   MRN: [id number]  HPI Patient returns today, cervical medial branch blocks bilateral C4-C5-C6 performed 08/02/2012. Patient states that these injections were not helpful in relieving her pain. She complains of pain at the base of her skull. She states that these pains also trigger headaches. She gives a history of migraine headaches that occur 15-30 days per month. She also takes Max ALT and runs out early each month. Max ALT is also helpful for her. She has tried physical therapy for the above without much success. She has been on chronic narcotic analgesics as well. Recent MRI was reviewed with the patient explained findings. Used a anatomic model as well as the actual images Pain Inventory Average Pain 8 Pain Right Now 4 My pain is burning, tingling and aching  In the last 24 hours, has pain interfered with the following? General activity 5 Relation with others 5 Enjoyment of life 7 What TIME of day is your pain at its worst? 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  no   Family History  Problem Relation Age of Onset  . Hypertension Mother    History   Social History  . Marital Status: Significant Other    Spouse Name: N/A    Number of Children: 2  . Years of Education: N/A   Occupational History  . Production designer, theatre/television/film   Social History Main Topics  . Smoking status: Former Games developer  . Smokeless tobacco: Never Used     Comment: smoked for 6 years, quit 15 years ago  . Alcohol Use: No  . Drug Use: No  . Sexually Active: None   Other  Topics Concern  . None   Social History Narrative   2nd child born recently in September 2010.   Married, one child from previous marriage lives in [place]   Past Surgical History  Procedure Laterality Date  . Breast biopsy    . Cesarean section  10/29/2008    twins  . Dilitation & currettage/hystroscopy with versapoint resection N/A 07/20/2012    Procedure: DILATATION & CURETTAGE/HYSTEROSCOPY WITH VERSAPOINT RESECTION;  Surgeon: Genia Del, MD;  Location: [facility];  Service: Gynecology;  Laterality: N/A;  . Hysteroscopy w/d&c  07-20-12   Past Medical History  Diagnosis Date  . DEPRESSIVE DISORDER 07/26/2007  . UTI 05/11/2007  . Lump or mass in breast 11/14/2008  . 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 Cervicalgia 07/26/2007  . BACK PAIN 07/26/2007  . Palpitations 09/21/2008  . Grave's disease     hx of Grave's Disease/Hyperthyroidism  . Hx of migraines   . Anxiety 06/02/2010  . Insomnia 06/02/2010  . 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 Lumbar degenerative disc disease 01/20/2011  . Hyperlipidemia 12/17/2011   BP 109/68  Pulse 75  Resp 14  Ht 5\' 6"  (1.676 m)  Wt 151 lb 3.2 oz (68.584 kg)  BMI 24.42 kg/m2  SpO2 96%     Review of Systems  HENT: Positive for neck pain.   Musculoskeletal: Positive for back pain.  Psychiatric/Behavioral: The patient is nervous/anxious.   All  other systems reviewed and are negative.       Objective:   Physical Exam Neck has full range of motion however she does have pain with extension of her cervical spine. Inpatients testing of the shoulders is negative. Motor strength of the upper extremities is normal Sensory exam of the upper extremities is normal Deep tendon reflexes of the upper extremities are normal. There is no tenderness to palpation in the suboccipital area as well as the cervical paraspinals and para scapular muscles.       Assessment & Plan:  1. Upper cervical pain. Symptoms are suggestive of  cervical facet mediated pain in the upper cervical spine. I'll make a referral for upper cervical medial branch blocks versus joint injections. It this is not helpful may consider epidural although the cervical degenerative disc findings were not very impressive. Patient states she has had some moderate success with cervical epidural in the past. If cervical injections are not helpful would consider prophylactic Botox. This may be related to her chronic migraines although in her mind she feels that the neck pain triggers for migraines.  Over half of the 25 min visit was spent counseling and coordinating care.

## 2012-09-15 NOTE — Telephone Encounter (Signed)
Done hardcopy to [facility]  

## 2012-10-05 ENCOUNTER — Other Ambulatory Visit: Payer: Self-pay

## 2012-10-05 DIAGNOSIS — Z1231 Encounter for screening mammogram for malignant neoplasm of breast: Secondary | ICD-10-CM

## 2012-10-14 ENCOUNTER — Encounter: Payer: Self-pay | Admitting: Internal Medicine

## 2012-10-14 MED ORDER — OXYCODONE HCL 10 MG PO TB12: 10.0000 mg | ORAL_TABLET | Freq: Two times a day (BID) | ORAL | Status: DC | PRN

## 2012-10-14 NOTE — Telephone Encounter (Signed)
Done hardcopy to [facility]  

## 2012-10-18 ENCOUNTER — Telehealth: Payer: Self-pay | Admitting: Physical Medicine & Rehabilitation

## 2012-10-18 NOTE — Telephone Encounter (Signed)
I called patient to remind them of appointment with Dr. Bryson Dames on 9/11 and patient stated she was not sure if she was to still come in for the MBB since she has not heard from the neurosurgery appointment.  The referral was made and have told Cassandra Gallagher to follow up with Dr. Ollen Bowl office about this referral.  Please advise patient of what she should do.

## 2012-10-20 ENCOUNTER — Ambulatory Visit
Admission: RE | Admit: 2012-10-20 | Discharge: 2012-10-20 | Disposition: A | Discharge status: Home / Self Care | Payer: BC Managed Care – PPO | Source: Ambulatory Visit

## 2012-10-20 ENCOUNTER — Ambulatory Visit: Payer: BC Managed Care – PPO | Admitting: Physical Medicine & Rehabilitation

## 2012-10-20 DIAGNOSIS — Z1231 Encounter for screening mammogram for malignant neoplasm of breast: Secondary | ICD-10-CM

## 2012-10-20 NOTE — Telephone Encounter (Signed)
May cancel our appt and f/u with Dr Ollen Bowl

## 2012-10-24 ENCOUNTER — Other Ambulatory Visit: Payer: Self-pay | Admitting: Internal Medicine

## 2012-10-24 DIAGNOSIS — R928 Other abnormal and inconclusive findings on diagnostic imaging of breast: Secondary | ICD-10-CM

## 2012-10-31 ENCOUNTER — Encounter: Payer: Self-pay | Admitting: Physical Medicine & Rehabilitation

## 2012-10-31 ENCOUNTER — Encounter: Payer: Self-pay | Admitting: Internal Medicine

## 2012-11-01 ENCOUNTER — Encounter: Payer: BC Managed Care – PPO | Attending: Physical Medicine and Rehabilitation

## 2012-11-01 ENCOUNTER — Encounter: Payer: Self-pay | Admitting: Physical Medicine & Rehabilitation

## 2012-11-01 ENCOUNTER — Encounter: Payer: Self-pay | Admitting: Internal Medicine

## 2012-11-01 ENCOUNTER — Ambulatory Visit (HOSPITAL_BASED_OUTPATIENT_CLINIC_OR_DEPARTMENT_OTHER): Payer: BC Managed Care – PPO | Admitting: Physical Medicine & Rehabilitation

## 2012-11-01 ENCOUNTER — Other Ambulatory Visit: Payer: Self-pay

## 2012-11-01 VITALS — BP 117/72 | HR 75 | Resp 14 | Ht 66.0 in | Wt 151.8 lb

## 2012-11-01 DIAGNOSIS — M47812 Spondylosis without myelopathy or radiculopathy, cervical region: Secondary | ICD-10-CM | POA: Insufficient documentation

## 2012-11-01 DIAGNOSIS — M47817 Spondylosis without myelopathy or radiculopathy, lumbosacral region: Secondary | ICD-10-CM | POA: Insufficient documentation

## 2012-11-01 DIAGNOSIS — M5030 Other cervical disc degeneration, unspecified cervical region: Secondary | ICD-10-CM

## 2012-11-01 DIAGNOSIS — G43909 Migraine, unspecified, not intractable, without status migrainosus: Secondary | ICD-10-CM | POA: Insufficient documentation

## 2012-11-01 MED ORDER — HYDROCODONE-ACETAMINOPHEN 7.5-325 MG PO TABS: 1.0000 | ORAL_TABLET | Freq: Two times a day (BID) | ORAL | Status: DC | PRN

## 2012-11-01 NOTE — Telephone Encounter (Signed)
Faxed hardcopy to [employer] [place] of Hydrocodone.  Patient requested by email.

## 2012-11-01 NOTE — Telephone Encounter (Signed)
Done hardcopy to [facility]  

## 2012-11-01 NOTE — Patient Instructions (Signed)
I am not comfortable prescribing a medication which is not approved in [place] and also is no longer used in [place] for chronic pain Radiology will call you for [facility]

## 2012-11-01 NOTE — Progress Notes (Signed)
Subjective:    Patient ID: Cassandra Gallagher, female    DOB: 1960/03/31, 52 y.o.   MRN: [id number]  HPI Continues with neck pain. She did not get into [facility] a neurosurgery practice for epidural. No sweats or chills no weight loss. Repeatedly asks me about a medication that she had tried in [place] Flupertine.  I looked this up after the visit, new restrictions place on use in [place] only acute pain, reported cases of Liver failure.  Not FDA approved in [place] Pain Inventory Average Pain 7 Pain Right Now 6 My pain is constant, burning, dull, tingling and aching  In the last 24 hours, has pain interfered with the following? General activity 5 Relation with others 6 Enjoyment of life 6 What TIME of day is your pain at its worst? morning and night Sleep (in general) Poor  Pain is worse with: inactivity and sleeping Pain improves with: heat/ice, therapy/exercise, medication and injections Relief from Meds: 8  Mobility walk without assistance ability to climb steps?  yes  Function employed # of hrs/week . 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  no  Physicians involved in your care Any changes since last visit?  no   Family History  Problem Relation Age of Onset  . Hypertension Mother    History   Social History  . Marital Status: Significant Other    Spouse Name: N/A    Number of Children: 2  . Years of Education: N/A   Occupational History  . Production designer, theatre/television/film   Social History Main Topics  . Smoking status: Former Games developer  . Smokeless tobacco: Never Used     Comment: smoked for 6 years, quit 15 years ago  . Alcohol Use: No  . Drug Use: No  . Sexual Activity: None   Other Topics Concern  . None   Social History Narrative   2nd child born recently in September 2010.   Married, one child from previous marriage  lives in [place]   Past Surgical History  Procedure Laterality Date  . Breast biopsy    . Cesarean section  10/29/2008    twins  . Dilitation & currettage/hystroscopy with versapoint resection N/A 07/20/2012    Procedure: DILATATION & CURETTAGE/HYSTEROSCOPY WITH VERSAPOINT RESECTION;  Surgeon: Genia Del, MD;  Location: [facility];  Service: Gynecology;  Laterality: N/A;  . Hysteroscopy w/d&c  07-20-12   Past Medical History  Diagnosis Date  . DEPRESSIVE DISORDER 07/26/2007  . UTI 05/11/2007  . Lump or mass in breast 11/14/2008  . 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 Cervicalgia 07/26/2007  . BACK PAIN 07/26/2007  . Palpitations 09/21/2008  . Grave's disease     hx of Grave's Disease/Hyperthyroidism  . Hx of migraines   . Anxiety 06/02/2010  . Insomnia 06/02/2010  . 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 Lumbar degenerative disc disease 01/20/2011  . Hyperlipidemia 12/17/2011   BP 117/72  Pulse 75  Resp 14  Ht 5\' 6"  (1.676 m)  Wt 151 lb 12.8 oz (68.856 kg)  BMI 24.51 kg/m2  SpO2 98%    Review of Systems  HENT: Positive for neck pain.   Gastrointestinal: Positive for nausea and vomiting.  Musculoskeletal:       Spasms  Neurological: Positive for headaches.       Tingling  Psychiatric/Behavioral: The patient is nervous/anxious.   All  other systems reviewed and are negative.       Objective:   Physical Exam  Nursing note and vitals reviewed. Constitutional: She is oriented to person, place, and time. She appears well-developed and well-nourished.  HENT:  Head: Atraumatic.  Neck: Normal range of motion.  Musculoskeletal:       Cervical back: She exhibits tenderness.  Mild cervical paraspinal tenderness  Neurological: She is alert and oriented to person, place, and time. She has normal strength. She displays no atrophy. No sensory deficit. She exhibits normal muscle tone. Coordination and gait normal.  Reflex Scores:      Tricep reflexes are 2+ on  the right side and 2+ on the left side.      Bicep reflexes are 2+ on the right side and 2+ on the left side.      Brachioradialis reflexes are 2+ on the right side and 2+ on the left side.      Patellar reflexes are 2+ on the right side and 2+ on the left side.      Achilles reflexes are 2+ on the right side and 2+ on the left side. Psychiatric: She has a normal mood and affect.           Assessment & Plan:  1. Cervical spondylosis as well as cervical degenerative disc. MRI findings are mild. Patient complains of pain that wakes her up at night. No upper extremity radiating pain. No weakness in the upper extremities. This has been a chronic problem and she has tried various treatments in the past. Some relief with cervical medial branch blocks in the mid cervical area. Also gets partial relief with manual traction with physical therapy Also reports having had good results with cervical epidural in the past at least on one or 2 occasions  We discussed treatment options including referral to surgery for an opinion. Certainly no signs of radiculopathy to indicate an urgent referral We also discussed trial of repeat cervical epidural injection C7-T1 We also discussed that this may be migraine related and may respond to Botox injection, I don't see any sign of cervical dystonia at the current time  RTC 6 wks

## 2012-11-07 ENCOUNTER — Ambulatory Visit
Admission: RE | Admit: 2012-11-07 | Discharge: 2012-11-07 | Disposition: A | Discharge status: Home / Self Care | Payer: BC Managed Care – PPO | Source: Ambulatory Visit | Attending: Internal Medicine | Admitting: Internal Medicine

## 2012-11-07 ENCOUNTER — Other Ambulatory Visit: Payer: Self-pay | Admitting: Internal Medicine

## 2012-11-07 DIAGNOSIS — R928 Other abnormal and inconclusive findings on diagnostic imaging of breast: Secondary | ICD-10-CM

## 2012-11-08 ENCOUNTER — Ambulatory Visit
Admission: RE | Admit: 2012-11-08 | Discharge: 2012-11-08 | Disposition: A | Discharge status: Home / Self Care | Payer: BC Managed Care – PPO | Source: Ambulatory Visit | Attending: Physical Medicine & Rehabilitation | Admitting: Physical Medicine & Rehabilitation

## 2012-11-08 DIAGNOSIS — M5030 Other cervical disc degeneration, unspecified cervical region: Secondary | ICD-10-CM

## 2012-11-08 DIAGNOSIS — M47812 Spondylosis without myelopathy or radiculopathy, cervical region: Secondary | ICD-10-CM

## 2012-11-08 MED ORDER — IOHEXOL 300 MG/ML  SOLN
1.0000 mL | Freq: Once | INTRAMUSCULAR | Status: AC | PRN
  Administered 2012-11-08: 1 mL via EPIDURAL

## 2012-11-08 MED ORDER — TRIAMCINOLONE ACETONIDE 40 MG/ML IJ SUSP (RADIOLOGY)
60.0000 mg | Freq: Once | INTRAMUSCULAR | Status: AC
  Administered 2012-11-08: 60 mg via EPIDURAL

## 2012-11-09 ENCOUNTER — Encounter: Payer: Self-pay | Admitting: Internal Medicine

## 2012-11-09 DIAGNOSIS — M542 Cervicalgia: Secondary | ICD-10-CM

## 2012-11-09 DIAGNOSIS — R51 Headache: Secondary | ICD-10-CM

## 2012-11-09 MED ORDER — OXYCODONE HCL ER 10 MG PO T12A: 10.0000 mg | EXTENDED_RELEASE_TABLET | Freq: Two times a day (BID) | ORAL | Status: DC

## 2012-11-09 NOTE — Addendum Note (Signed)
Addended by: Corwin Levins on: 11/09/2012 07:24 PM   Modules accepted: Orders

## 2012-11-09 NOTE — Telephone Encounter (Signed)
Done hardcopy to [facility]  

## 2012-11-29 ENCOUNTER — Encounter: Payer: Self-pay | Admitting: Physical Medicine & Rehabilitation

## 2012-11-29 ENCOUNTER — Encounter: Payer: Self-pay | Admitting: Internal Medicine

## 2012-11-29 ENCOUNTER — Encounter: Payer: BC Managed Care – PPO | Attending: Physical Medicine and Rehabilitation

## 2012-11-29 ENCOUNTER — Ambulatory Visit (HOSPITAL_BASED_OUTPATIENT_CLINIC_OR_DEPARTMENT_OTHER): Payer: BC Managed Care – PPO | Admitting: Physical Medicine & Rehabilitation

## 2012-11-29 VITALS — BP 108/59 | HR 86 | Resp 14 | Ht 66.0 in | Wt 149.0 lb

## 2012-11-29 DIAGNOSIS — M47812 Spondylosis without myelopathy or radiculopathy, cervical region: Secondary | ICD-10-CM | POA: Insufficient documentation

## 2012-11-29 DIAGNOSIS — G43709 Chronic migraine without aura, not intractable, without status migrainosus: Secondary | ICD-10-CM

## 2012-11-29 DIAGNOSIS — G43909 Migraine, unspecified, not intractable, without status migrainosus: Secondary | ICD-10-CM | POA: Insufficient documentation

## 2012-11-29 DIAGNOSIS — M47817 Spondylosis without myelopathy or radiculopathy, lumbosacral region: Secondary | ICD-10-CM | POA: Insufficient documentation

## 2012-11-29 DIAGNOSIS — IMO0002 Reserved for concepts with insufficient information to code with codable children: Secondary | ICD-10-CM

## 2012-11-29 MED ORDER — HYDROCODONE-ACETAMINOPHEN 7.5-325 MG PO TABS: 1.0000 | ORAL_TABLET | Freq: Two times a day (BID) | ORAL | Status: DC | PRN

## 2012-11-29 NOTE — Progress Notes (Signed)
Subjective:    Patient ID: Cassandra Gallagher, female    DOB: 11/12/1960, 52 y.o.   MRN: [id number]  HPI Cervical C7-T1 ESI, helpful for neck pain and headache Nocturnal pain better Not taking as much narcotic pain meds, no oxycontin for several days, occ hydrocodone Not using maxalt Low back overall improved, has had good results with MBB last time 12/30 Pain Inventory Average Pain 4 Pain Right Now 0 My pain is intermittent, tingling and aching  In the last 24 hours, has pain interfered with the following? General activity 2 Relation with others 2 Enjoyment of life 2 What TIME of day is your pain at its worst? morning and night Sleep (in general) Fair  Pain is worse with: sleep Pain improves with: heat/ice, therapy/exercise, medication and injections Relief from Meds: 8  Mobility ability to climb steps?  yes do you drive?  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  yes  Physicians involved in your care Any changes since last visit?  no   Family History  Problem Relation Age of Onset  . Hypertension Mother    History   Social History  . Marital Status: Significant Other    Spouse Name: N/A    Number of Children: 2  . Years of Education: N/A   Occupational History  . Production designer, theatre/television/film   Social History Main Topics  . Smoking status: Former Games developer  . Smokeless tobacco: Never Used     Comment: smoked for 6 years, quit 15 years ago  . Alcohol Use: No  . Drug Use: No  . Sexual Activity: None   Other Topics Concern  . None   Social History Narrative   2nd child born recently in September 2010.   Married, one child from previous marriage lives in [place]   Past Surgical History  Procedure Laterality Date  . Breast biopsy    . Cesarean section  10/29/2008    twins  . Dilitation & currettage/hystroscopy with versapoint resection N/A 07/20/2012    Procedure: DILATATION &  CURETTAGE/HYSTEROSCOPY WITH VERSAPOINT RESECTION;  Surgeon: Genia Del, MD;  Location: [facility];  Service: Gynecology;  Laterality: N/A;  . Hysteroscopy w/d&c  07-20-12   Past Medical History  Diagnosis Date  . DEPRESSIVE DISORDER 07/26/2007  . UTI 05/11/2007  . Lump or mass in breast 11/14/2008  . 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 Cervicalgia 07/26/2007  . BACK PAIN 07/26/2007  . Palpitations 09/21/2008  . Grave's disease     hx of Grave's Disease/Hyperthyroidism  . Hx of migraines   . Anxiety 06/02/2010  . Insomnia 06/02/2010  . 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 Lumbar degenerative disc disease 01/20/2011  . Hyperlipidemia 12/17/2011   BP 108/59  Pulse 86  Resp 14  Ht 5\' 6"  (1.676 m)  Wt 149 lb (67.586 kg)  BMI 24.06 kg/m2  SpO2 98%     Review of Systems  Respiratory: Positive for cough.   Musculoskeletal: Positive for back pain and neck pain.  Psychiatric/Behavioral: The patient is nervous/anxious.   All other systems reviewed and are negative.       Objective:   Physical Exam  Nursing note and vitals reviewed.  Constitutional: She is oriented to person, place, and time. She appears well-developed and well-nourished.  HENT:  Head: Atraumatic.  Neck: Normal range of motion.  Musculoskeletal:  Cervical back: She exhibits tenderness.  Mild cervical paraspinal tenderness  Neurological: She is alert and oriented to person, place, and time. She has normal strength. She displays no atrophy. No sensory deficit. She exhibits normal muscle tone. Coordination and gait normal.  Reflex Scores:  Tricep reflexes are 2+ on the right side and 2+ on the left side.  Bicep reflexes are 2+ on the right side and 2+ on the left side.  Brachioradialis reflexes are 2+ on the right side and 2+ on the left side.  Patellar reflexes are 2+ on the right side and 2+ on the left side.  Achilles reflexes are 2+ on the right side and 2+ on the left  side. Psychiatric: She has a normal mood and affect.        Assessment & Plan:  1. Cervical spondylosis as well as cervical degenerative disc. MRI findings are mild. Patient complains of pain that wakes her up at night. No upper extremity radiating pain. No weakness in the upper extremities.  This has been a chronic problem and she has tried various treatments in the past. Some relief with cervical medial branch blocks in the mid cervical area.   Also reports having had good results with cervical epidural  We discussed there ESI can be repeated every 3-4 months RTC 3 months Call if injection wearing off  2.  Lumbar spondylosis good results with MBB may repeat when worn off

## 2012-11-29 NOTE — Telephone Encounter (Signed)
Done hardcopy to [facility]  

## 2012-11-29 NOTE — Patient Instructions (Signed)
Call back if you need repeat neck or back injection

## 2012-11-30 ENCOUNTER — Telehealth: Payer: Self-pay | Admitting: *Deleted

## 2012-11-30 NOTE — Telephone Encounter (Signed)
The pharmacist called requesting early refill on Hydrocodone refill dated 10.26.14.  Please advise

## 2012-11-30 NOTE — Telephone Encounter (Signed)
The next 3 mo refills have already been addressed and I think hardcopy in hand of pt

## 2012-12-09 ENCOUNTER — Ambulatory Visit: Payer: BC Managed Care – PPO | Admitting: Internal Medicine

## 2012-12-13 ENCOUNTER — Ambulatory Visit (INDEPENDENT_AMBULATORY_CARE_PROVIDER_SITE_OTHER): Payer: BC Managed Care – PPO | Admitting: Internal Medicine

## 2012-12-13 ENCOUNTER — Encounter: Payer: Self-pay | Admitting: Internal Medicine

## 2012-12-13 VITALS — BP 112/78 | HR 90 | Temp 99.3°F | Ht 66.0 in | Wt 150.1 lb

## 2012-12-13 DIAGNOSIS — G43909 Migraine, unspecified, not intractable, without status migrainosus: Secondary | ICD-10-CM

## 2012-12-13 DIAGNOSIS — F419 Anxiety disorder, unspecified: Secondary | ICD-10-CM

## 2012-12-13 DIAGNOSIS — G8929 Other chronic pain: Secondary | ICD-10-CM

## 2012-12-13 DIAGNOSIS — F411 Generalized anxiety disorder: Secondary | ICD-10-CM

## 2012-12-13 MED ORDER — OXYCODONE HCL ER 10 MG PO T12A: 10.0000 mg | EXTENDED_RELEASE_TABLET | Freq: Two times a day (BID) | ORAL | Status: DC

## 2012-12-13 NOTE — Assessment & Plan Note (Signed)
stable overall by history and exam, recent data reviewed with pt, and pt to continue medical treatment as before,  to f/u any worsening symptoms or concerns Lab Results  Component Value Date   WBC 4.9 07/20/2012   HGB 13.9 07/20/2012   HCT 40.8 07/20/2012   PLT 245 07/20/2012   GLUCOSE 103* 12/16/2011   CHOL 211* 03/22/2012   TRIG 189.0* 03/22/2012   HDL 29.30* 03/22/2012   LDLDIRECT 148.2 03/22/2012   LDLCALC 123* 09/12/2010   ALT 21 12/16/2011   AST 20 12/16/2011   NA 141 12/16/2011   K 4.3 12/16/2011   CL 106 12/16/2011   CREATININE 0.7 12/16/2011   BUN 15 12/16/2011   CO2 29 12/16/2011   TSH 1.23 12/16/2011

## 2012-12-13 NOTE — Progress Notes (Signed)
Pre-visit discussion using our clinic review tool. No additional management support is needed unless otherwise documented below in the visit note. cp

## 2012-12-13 NOTE — Progress Notes (Signed)
Subjective:    Patient ID: Cassandra Gallagher, female    DOB: 1960-04-29, 52 y.o.   MRN: [id number]  HPI  Here to f/u, "I guess maybe for advice", s/p esi to neck which helped for 1 mo, now returning.  Did see Dr Lewit/neurology for migraine, pt states migraine freq worse with incr freq of pain of neck.  Tx with topamax, but did not take as she felt the exam was rushed and was wary of side effect incluidng memory and sleeping. Was actually feeling some improved at the time, now migraine freq getting worse again, and "now I'm getting desparate."  Pt continues to have recurring neck and LBP without change in severity, bowel or bladder change, fever, wt loss,  worsening LE pain/numbness/weakness, gait change or falls, sees Dr Jess Barters but getting rx here.  maxalt helps for migraine, but too many. Other than migraine, most severe pain is with lying down.   Past Medical History  Diagnosis Date  . DEPRESSIVE DISORDER 07/26/2007  . UTI 05/11/2007  . Lump or mass in breast 11/14/2008  . 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 Cervicalgia 07/26/2007  . BACK PAIN 07/26/2007  . Palpitations 09/21/2008  . Grave's disease     hx of Grave's Disease/Hyperthyroidism  . Hx of migraines   . Anxiety 06/02/2010  . Insomnia 06/02/2010  . 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 Lumbar degenerative disc disease 01/20/2011  . Hyperlipidemia 12/17/2011   Past Surgical History  Procedure Laterality Date  . Breast biopsy    . Cesarean section  10/29/2008    twins  . Dilitation & currettage/hystroscopy with versapoint resection N/A 07/20/2012    Procedure: DILATATION & CURETTAGE/HYSTEROSCOPY WITH VERSAPOINT RESECTION;  Surgeon: Genia Del, MD;  Location: [facility];  Service: Gynecology;  Laterality: N/A;  . Hysteroscopy w/d&c  07-20-12    reports that she has quit smoking. She has never used smokeless tobacco. She reports that she does not drink alcohol or use illicit drugs. family history includes  Hypertension in her mother. No Known Allergies Current Outpatient Prescriptions on File Prior to Visit  Medication Sig Dispense Refill  . Calcium Carbonate-Vitamin D (CALCIUM + D PO) Take 1 tablet by mouth 2 (two) times daily.      . clonazePAM (KLONOPIN) 0.5 MG tablet TAKE 1 TABLET BY MOUTH TWICE DAILY AS NEEDED  60 tablet  4  . COMBIPATCH 0.05-0.14 MG/DAY Place 1 patch onto the skin Every third day.      Marland Kitchen HYDROcodone-acetaminophen (NORCO) 7.5-325 MG per tablet Take 1 tablet by mouth 2 (two) times daily as needed for pain. To fill Feb 02, 2013  60 tablet  0  . methocarbamol (ROBAXIN) 500 MG tablet Take 500 mg by mouth 3 (three) times daily as needed (for muscle spasms).      . Multiple Vitamin (MULTIVITAMIN WITH MINERALS) TABS Take 1 tablet by mouth daily.      . Naproxen Sodium (NAPRELAN) 375 MG TB24 Take 1 tablet by mouth 2 (two) times daily as needed (for pain).       . OxyCODONE (OXYCONTIN) 10 mg T12A 12 hr tablet Take 1 tablet (10 mg total) by mouth every 12 (twelve) hours.  60 tablet  0  . Red Yeast Rice Extract (RED YEAST RICE PO) Take 1 tablet by mouth daily.      . rizatriptan (MAXALT-MLT) 10 MG disintegrating tablet Take 1 tablet (10 mg total) by mouth as needed for migraine. May repeat in 2 hours if needed  10 tablet  5   No current facility-administered medications on file prior to visit.    Review of Systems  Constitutional: Negative for unexpected weight change, or unusual diaphoresis  HENT: Negative for tinnitus.   Eyes: Negative for photophobia and visual disturbance.  Respiratory: Negative for choking and stridor.   Gastrointestinal: Negative for vomiting and blood in stool.  Genitourinary: Negative for hematuria and decreased urine volume.  Musculoskeletal: Negative for acute joint swelling Skin: Negative for color change and wound.  Neurological: Negative for tremors and numbness other than noted  Psychiatric/Behavioral: Negative for decreased concentration or   hyperactivity.       Objective:   Physical Exam BP 112/78  Pulse 90  Temp(Src) 99.3 F (37.4 C) (Oral)  Ht 5\' 6"  (1.676 m)  Wt 150 lb 2 oz (68.096 kg)  BMI 24.24 kg/m2  SpO2 93% VS noted,  Constitutional: Pt appears well-developed and well-nourished.  HENT: Head: NCAT.  Right Ear: External ear normal.  Left Ear: External ear normal.  Eyes: Conjunctivae and EOM are normal. Pupils are equal, round, and reactive to light.  Neck: Normal range of motion. Neck supple.  Cardiovascular: Normal rate and regular rhythm.   Pulmonary/Chest: Effort normal and breath sounds normal.  Abd:  Soft, NT, non-distended, + BS Neurological: Pt is alert. Not confused , motor 5/5 Skin: Skin is warm. No erythema.  Psychiatric: Pt behavior is normal. Thought content normal. 1+ nerovus    Assessment & Plan:

## 2012-12-13 NOTE — Patient Instructions (Addendum)
Please try the topamax as prescribed  If this does not help, please see Dr Jess Barters for the botox shots  Please continue all other medications as before, and refills have been done if requested - the oxycodone  Today, also you are given the letter today explaining the transitional pain medication refill policy, due to the recent law changes regarding controlled substances  Please be aware that I will no longer be able to offer monthly refills of any Schedule II or higher medication starting Mar 12, 2013

## 2012-12-13 NOTE — Assessment & Plan Note (Signed)
D/w pt, ok to try the topamax as precribed per dr lewit/neurology, tried to reassure regarding chance of side effect

## 2012-12-13 NOTE — Assessment & Plan Note (Signed)
With ongoing onxcodone use; d/w pt that I will no longer offer chronic pain management as a part of my practice after feb 1, and will alternate provider

## 2012-12-15 ENCOUNTER — Encounter: Payer: Self-pay | Admitting: Physical Medicine & Rehabilitation

## 2012-12-15 ENCOUNTER — Other Ambulatory Visit: Payer: Self-pay

## 2012-12-19 ENCOUNTER — Ambulatory Visit (HOSPITAL_BASED_OUTPATIENT_CLINIC_OR_DEPARTMENT_OTHER): Payer: BC Managed Care – PPO | Admitting: Physical Medicine & Rehabilitation

## 2012-12-19 ENCOUNTER — Encounter: Payer: Self-pay | Admitting: Physical Medicine & Rehabilitation

## 2012-12-19 ENCOUNTER — Encounter: Payer: BC Managed Care – PPO | Attending: Physical Medicine and Rehabilitation

## 2012-12-19 VITALS — BP 104/67 | HR 82 | Resp 14 | Ht 66.0 in | Wt 151.2 lb

## 2012-12-19 DIAGNOSIS — M47812 Spondylosis without myelopathy or radiculopathy, cervical region: Secondary | ICD-10-CM

## 2012-12-19 DIAGNOSIS — G43709 Chronic migraine without aura, not intractable, without status migrainosus: Secondary | ICD-10-CM

## 2012-12-19 DIAGNOSIS — IMO0002 Reserved for concepts with insufficient information to code with codable children: Secondary | ICD-10-CM

## 2012-12-19 DIAGNOSIS — G43909 Migraine, unspecified, not intractable, without status migrainosus: Secondary | ICD-10-CM | POA: Insufficient documentation

## 2012-12-19 DIAGNOSIS — M47817 Spondylosis without myelopathy or radiculopathy, lumbosacral region: Secondary | ICD-10-CM | POA: Insufficient documentation

## 2012-12-19 DIAGNOSIS — G8929 Other chronic pain: Secondary | ICD-10-CM

## 2012-12-19 NOTE — Patient Instructions (Signed)
Continue Topamax as prescribed by a neurologist. Continue Klonopin 2 tablets every night until Topamax dose is 100 mg. Then been Klonopin 2 tablet every night for 1 week and then one half tablet every night for 1 week and then discontinue

## 2012-12-19 NOTE — Progress Notes (Signed)
Subjective:    Patient ID: Cassandra Gallagher, female    DOB: [birth year], 52 y.o.   MRN: [id number]  HPI Saw Dr Vela Prose who prescribed Topamax for migraine headaches Was doing better initially after injection 11/08/2012 when I saw pt 11/29/2012 Pain started coming back, saw Dr Vela Prose.  Started on Topamax 1 week ago, 25mg  at night, scheduled for increasing doses.   Cervical epidural helped.   Pain Inventory Average Pain 4 Pain Right Now 0  Has a cold and generally doesn't feel well My pain is sharp, burning, dull, stabbing, tingling and aching  In the last 24 hours, has pain interfered with the following? General activity 7 Relation with others 7 Enjoyment of life 8 What TIME of day is your pain at its worst? varies Sleep (in general) Poor  Pain is worse with: inactivity Pain improves with: rest and medication Relief from Meds: 5  Mobility do you drive?  yes  Function employed # of hrs/week .  Neuro/Psych spasms anxiety  Prior Studies Any changes since last visit?  no  Physicians involved in your care Any changes since last visit?  no   Family History  Problem Relation Age of Onset  . Hypertension Mother    History   Social History  . Marital Status: Significant Other    Spouse Name: N/A    Number of Children: 2  . Years of Education: N/A   Occupational History  . Production designer, theatre/television/film   Social History Main Topics  . Smoking status: Former Games developer  . Smokeless tobacco: Never Used     Comment: smoked for 6 years, quit 15 years ago  . Alcohol Use: No  . Drug Use: No  . Sexual Activity: None   Other Topics Concern  . None   Social History Narrative   2nd child born recently in September 2010.   Married, one child from previous marriage lives in [place]   Past Surgical History  Procedure Laterality Date  . Breast biopsy    . Cesarean section  10/29/2008    twins  . Dilitation & currettage/hystroscopy with versapoint resection N/A 07/20/2012   Procedure: DILATATION & CURETTAGE/HYSTEROSCOPY WITH VERSAPOINT RESECTION;  Surgeon: Genia Del, MD;  Location: [facility];  Service: Gynecology;  Laterality: N/A;  . Hysteroscopy w/d&c  07-20-12   Past Medical History  Diagnosis Date  . DEPRESSIVE DISORDER 07/26/2007  . UTI 05/11/2007  . Lump or mass in breast 11/14/2008  . 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 Cervicalgia 07/26/2007  . BACK PAIN 07/26/2007  . Palpitations 09/21/2008  . Grave's disease     hx of Grave's Disease/Hyperthyroidism  . Hx of migraines   . Anxiety 06/02/2010  . Insomnia 06/02/2010  . 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 Lumbar degenerative disc disease 01/20/2011  . Hyperlipidemia 12/17/2011   BP 104/67  Pulse 82  Resp 14  Ht 5\' 6"  (1.676 m)  Wt 151 lb 3.2 oz (68.584 kg)  BMI 24.42 kg/m2  SpO2 98%    Review of Systems  Musculoskeletal: Positive for neck pain.  Psychiatric/Behavioral: The patient is nervous/anxious.   All other systems reviewed and are negative.       Objective:   Physical Exam  Nursing note and vitals reviewed.  Constitutional: She is oriented to person, place, and time. She appears well-developed and well-nourished.  HENT:  Head: Atraumatic.  Neck: Normal range of motion.  Musculoskeletal:  Cervical back: She exhibits tenderness.  Mild cervical paraspinal tenderness  Neurological: She is alert and oriented to person, place, and time. She has normal strength. She displays no atrophy. No sensory deficit. She exhibits normal muscle tone. Coordination and gait normal.  Reflex Scores:  Tricep reflexes are 2+ on the right side and 2+ on the left side.  Bicep reflexes are 2+ on the right side and 2+ on the left side.  Brachioradialis reflexes are 2+ on the right side and 2+ on the left side.  Patellar reflexes are 2+ on the right side and 2+ on the left side.  Achilles reflexes are 2+ on the right side and 2+ on the left side. Psychiatric: She has a normal  mood and affect.        Assessment & Plan:  1. Cervical spondylosis as well as cervical degenerative disc. MRI findings are mild. Patient complains of pain that wakes her up at night. No upper extremity radiating pain. No weakness in the upper extremities.  This has been a chronic problem and she has tried various treatments in the past. Some relief with cervical medial branch blocks in the mid cervical area.  Also reports having had good results with cervical epidural  We discussed there ESI can be repeated every 3-4 months  RTC ~2 months    2. Migraine headaches topamax, has schedule of dosage escalation by Dr Vela Prose.  Reports that MRI head is planned if Topamax 100mg  is not helpful.  Started taking Klonopin 1mg  qhs which is helping with sleep.  Certainly is an anxiety component here.  PCP has been prescribing Klonopin for months.  I discuseed that once topamanx is at 100mg , she can wean Klonopin dose by 50% per week for 2 weeks and D/C

## 2012-12-26 ENCOUNTER — Ambulatory Visit: Payer: BC Managed Care – PPO | Admitting: Family Medicine

## 2012-12-27 ENCOUNTER — Encounter: Payer: Self-pay | Admitting: Internal Medicine

## 2012-12-27 ENCOUNTER — Ambulatory Visit (INDEPENDENT_AMBULATORY_CARE_PROVIDER_SITE_OTHER): Payer: BC Managed Care – PPO | Admitting: Internal Medicine

## 2012-12-27 VITALS — BP 110/82 | HR 98 | Temp 98.3°F | Ht 66.0 in | Wt 150.2 lb

## 2012-12-27 DIAGNOSIS — G43709 Chronic migraine without aura, not intractable, without status migrainosus: Secondary | ICD-10-CM

## 2012-12-27 DIAGNOSIS — F419 Anxiety disorder, unspecified: Secondary | ICD-10-CM

## 2012-12-27 DIAGNOSIS — F411 Generalized anxiety disorder: Secondary | ICD-10-CM

## 2012-12-27 DIAGNOSIS — IMO0002 Reserved for concepts with insufficient information to code with codable children: Secondary | ICD-10-CM

## 2012-12-27 DIAGNOSIS — J209 Acute bronchitis, unspecified: Secondary | ICD-10-CM

## 2012-12-27 MED ORDER — AZITHROMYCIN 250 MG PO TABS: ORAL_TABLET | ORAL | Status: DC

## 2012-12-27 MED ORDER — HYDROCODONE-HOMATROPINE 5-1.5 MG/5ML PO SYRP: 5.0000 mL | ORAL_SOLUTION | Freq: Four times a day (QID) | ORAL | Status: DC | PRN

## 2012-12-27 NOTE — Progress Notes (Signed)
Subjective:    Patient ID: Cassandra Gallagher, female    DOB: 1960/03/28, 52 y.o.   MRN: [id number]  HPI Here with acute onset mild to mod 3 wks ST, HA, general weakness and malaise, with prod cough greenish sputum, but Pt denies chest pain, increased sob or doe, wheezing, orthopnea, PND, increased LE swelling, palpitations, dizziness or syncope.  Migraine much improved with topamax but didhave 2 day dizziness at start, and several hallucinations now resolved, Denies worsening depressive symptoms, suicidal ideation, or panic; has ongoing anxiety, not increased recently.  Past Medical History  Diagnosis Date  . DEPRESSIVE DISORDER 07/26/2007  . UTI 05/11/2007  . Lump or mass in breast 11/14/2008  . 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 Cervicalgia 07/26/2007  . BACK PAIN 07/26/2007  . Palpitations 09/21/2008  . Grave's disease     hx of Grave's Disease/Hyperthyroidism  . Hx of migraines   . Anxiety 06/02/2010  . Insomnia 06/02/2010  . 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 Lumbar degenerative disc disease 01/20/2011  . Hyperlipidemia 12/17/2011   Past Surgical History  Procedure Laterality Date  . Breast biopsy    . Cesarean section  10/29/2008    twins  . Dilitation & currettage/hystroscopy with versapoint resection N/A 07/20/2012    Procedure: DILATATION & CURETTAGE/HYSTEROSCOPY WITH VERSAPOINT RESECTION;  Surgeon: Genia Del, MD;  Location: [facility];  Service: Gynecology;  Laterality: N/A;  . Hysteroscopy w/d&c  07-20-12    reports that she has quit smoking. She has never used smokeless tobacco. She reports that she does not drink alcohol or use illicit drugs. family history includes Hypertension in her mother. No Known Allergies Current Outpatient Prescriptions on File Prior to Visit  Medication Sig Dispense Refill  . Calcium Carbonate-Vitamin D (CALCIUM + D PO) Take 1 tablet by mouth 2 (two) times daily.      . clonazePAM (KLONOPIN) 0.5 MG tablet TAKE 1 TABLET BY  MOUTH TWICE DAILY AS NEEDED  60 tablet  4  . COMBIPATCH 0.05-0.14 MG/DAY Place 1 patch onto the skin Every third day.      Marland Kitchen HYDROcodone-acetaminophen (NORCO) 7.5-325 MG per tablet Take 1 tablet by mouth 2 (two) times daily as needed for pain. To fill Feb 02, 2013  60 tablet  0  . methocarbamol (ROBAXIN) 500 MG tablet Take 500 mg by mouth 3 (three) times daily as needed (for muscle spasms).      . Multiple Vitamin (MULTIVITAMIN WITH MINERALS) TABS Take 1 tablet by mouth daily.      . Naproxen Sodium (NAPRELAN) 375 MG TB24 Take 1 tablet by mouth 2 (two) times daily as needed (for pain).       . OxyCODONE (OXYCONTIN) 10 mg T12A 12 hr tablet Take 1 tablet (10 mg total) by mouth every 12 (twelve) hours. To fill Feb 11, 2013  60 tablet  0  . Red Yeast Rice Extract (RED YEAST RICE PO) Take 1 tablet by mouth daily.      . rizatriptan (MAXALT-MLT) 10 MG disintegrating tablet Take 1 tablet (10 mg total) by mouth as needed for migraine. May repeat in 2 hours if needed  10 tablet  5  . topiramate (TOPAMAX) 25 MG tablet Take 4 tablets by mouth daily. Increasing by 25 mg weekly to reach 100 mg daily       No current facility-administered medications on file prior to visit.   Review of Systems  Constitutional: Negative for unexpected weight change, or unusual diaphoresis  HENT: Negative  for tinnitus.   Eyes: Negative for photophobia and visual disturbance.  Respiratory: Negative for choking and stridor.   Gastrointestinal: Negative for vomiting and blood in stool.  Genitourinary: Negative for hematuria and decreased urine volume.  Musculoskeletal: Negative for acute joint swelling Skin: Negative for color change and wound.  Neurological: Negative for tremors and numbness other than noted  Psychiatric/Behavioral: Negative for decreased concentration or  hyperactivity.       Objective:   Physical Exam BP 110/82  Pulse 98  Temp(Src) 98.3 F (36.8 C) (Oral)  Ht 5\' 6"  (1.676 m)  Wt 150 lb 4 oz (68.153  kg)  BMI 24.26 kg/m2  SpO2 97% VS noted, mild ill Constitutional: Pt appears well-developed and well-nourished.  HENT: Head: NCAT.  Right Ear: External ear normal.  Left Ear: External ear normal.  .Bilat tm's with mild erythema.  Max sinus areas mild tender.  Pharynx with mild erythema, no exudate Eyes: Conjunctivae and EOM are normal. Pupils are equal, round, and reactive to light.  Neck: Normal range of motion. Neck supple.  Cardiovascular: Normal rate and regular rhythm.   Pulmonary/Chest: Effort normal and breath sounds normal.  Neurological: Pt is alert. Not confused  Skin: Skin is warm. No erythema.  Psychiatric: Pt behavior is normal. Thought content normal.     Assessment & Plan:

## 2012-12-27 NOTE — Assessment & Plan Note (Signed)
stable overall by history and exam, and pt to continue medical treatment as before,  to f/u any worsening symptoms or concerns 

## 2012-12-27 NOTE — Patient Instructions (Signed)
Please take all new medication as prescribed Please continue all other medications as before 

## 2012-12-27 NOTE — Assessment & Plan Note (Signed)
stable overall by history and exam, recent data reviewed with pt, and pt to continue medical treatment as before,  to f/u any worsening symptoms or concerns Lab Results  Component Value Date   WBC 4.9 07/20/2012   HGB 13.9 07/20/2012   HCT 40.8 07/20/2012   PLT 245 07/20/2012   GLUCOSE 103* 12/16/2011   CHOL 211* 03/22/2012   TRIG 189.0* 03/22/2012   HDL 29.30* 03/22/2012   LDLDIRECT 148.2 03/22/2012   LDLCALC 123* 09/12/2010   ALT 21 12/16/2011   AST 20 12/16/2011   NA 141 12/16/2011   K 4.3 12/16/2011   CL 106 12/16/2011   CREATININE 0.7 12/16/2011   BUN 15 12/16/2011   CO2 29 12/16/2011   TSH 1.23 12/16/2011    

## 2012-12-27 NOTE — Assessment & Plan Note (Signed)
Mild to mod, for antibx course,  to f/u any worsening symptoms or concerns 

## 2012-12-27 NOTE — Progress Notes (Signed)
Pre-visit discussion using our clinic review tool. No additional management support is needed unless otherwise documented below in the visit note.  

## 2012-12-30 ENCOUNTER — Encounter: Payer: Self-pay | Admitting: Internal Medicine

## 2013-01-12 ENCOUNTER — Encounter: Payer: Self-pay | Admitting: Internal Medicine

## 2013-01-12 ENCOUNTER — Other Ambulatory Visit: Payer: Self-pay | Admitting: Internal Medicine

## 2013-01-12 NOTE — Telephone Encounter (Signed)
Done hardcopy to [facility]  

## 2013-01-12 NOTE — Telephone Encounter (Signed)
Faxed hardcopy to [facility] [place]

## 2013-02-06 ENCOUNTER — Encounter: Payer: Self-pay | Admitting: Physical Medicine & Rehabilitation

## 2013-02-06 ENCOUNTER — Encounter: Payer: BC Managed Care – PPO | Attending: Physical Medicine and Rehabilitation

## 2013-02-06 ENCOUNTER — Ambulatory Visit (HOSPITAL_BASED_OUTPATIENT_CLINIC_OR_DEPARTMENT_OTHER): Payer: BC Managed Care – PPO | Admitting: Physical Medicine & Rehabilitation

## 2013-02-06 ENCOUNTER — Telehealth: Payer: Self-pay | Admitting: Internal Medicine

## 2013-02-06 VITALS — BP 105/66 | HR 76 | Resp 14 | Ht 66.0 in | Wt 150.0 lb

## 2013-02-06 DIAGNOSIS — G8929 Other chronic pain: Secondary | ICD-10-CM

## 2013-02-06 DIAGNOSIS — M47812 Spondylosis without myelopathy or radiculopathy, cervical region: Secondary | ICD-10-CM | POA: Insufficient documentation

## 2013-02-06 DIAGNOSIS — Z79899 Other long term (current) drug therapy: Secondary | ICD-10-CM

## 2013-02-06 DIAGNOSIS — Z5181 Encounter for therapeutic drug level monitoring: Secondary | ICD-10-CM

## 2013-02-06 DIAGNOSIS — G43909 Migraine, unspecified, not intractable, without status migrainosus: Secondary | ICD-10-CM | POA: Insufficient documentation

## 2013-02-06 DIAGNOSIS — M47817 Spondylosis without myelopathy or radiculopathy, lumbosacral region: Secondary | ICD-10-CM

## 2013-02-06 NOTE — Progress Notes (Signed)
Subjective:    Patient ID: Cassandra Gallagher, female    DOB: 11-28-1960, 52 y.o.   MRN: [id number] Chief complaint neck pain Secondary complaint low back pain  HPI  Patient returns again with multiple concerns.  She was doing better for a month or so after her cervical epidural 11/08/2012. Starting developing increasing posterior headache pain. Underwent neurologic evaluation and started on Topamax. Had side effects with Topamax including confusion and hallucinations. Dose was adjusted. Overall headache pain has improved and patient plans to follow up with neurology   Patient's back pain has been increasing as well. The last L3 L4-L5 medial branch blocks were performed in December of 2013. This was quite helpful for her and she would like to repeat these.  Patient states that she has difficulty with her usual daily activities because of poor sleep related to pain. This applies to both neck pain as well as low back pain.  In addition her primary care physician is no longer prescribing opiate medications, patient is requesting that our office start doing so. She takes no more than 4 opioid pain pills per day. She is prescribed both OxyContin CR 10 mg twice a day as well as hydrocodone 7.5 mg twice a day as needed. Many days she will take 3 tablets per day. She alternates either dropping a dose of hydrocodone or OxyContin depending on the day.  Her husband is with her today as well.  Pain Inventory Average Pain 0 Pain Right Now 1 My pain is constant and dull  In the last 24 hours, has pain interfered with the following? General activity 4 Relation with others 4 Enjoyment of life 4 What TIME of day is your pain at its worst? morning Sleep (in general) Fair  Pain is worse with: some activites Pain improves with: heat/ice, therapy/exercise, medication and injections Relief from Meds: 5  Mobility do you drive?  yes  Function employed # of hrs/week  .  Neuro/Psych spasms anxiety  Prior Studies Any changes since last visit?  no  Physicians involved in your care Any changes since last visit?  no   Family History  Problem Relation Age of Onset  . Hypertension Mother    History   Social History  . Marital Status: Significant Other    Spouse Name: N/A    Number of Children: 2  . Years of Education: N/A   Occupational History  . Production designer, theatre/television/film   Social History Main Topics  . Smoking status: Former Games developer  . Smokeless tobacco: Never Used     Comment: smoked for 6 years, quit 15 years ago  . Alcohol Use: No  . Drug Use: No  . Sexual Activity: None   Other Topics Concern  . None   Social History Narrative   2nd child born recently in September 2010.   Married, one child from previous marriage lives in [place]   Past Surgical History  Procedure Laterality Date  . Breast biopsy    . Cesarean section  10/29/2008    twins  . Dilitation & currettage/hystroscopy with versapoint resection N/A 07/20/2012    Procedure: DILATATION & CURETTAGE/HYSTEROSCOPY WITH VERSAPOINT RESECTION;  Surgeon: Genia Del, MD;  Location: [facility];  Service: Gynecology;  Laterality: N/A;  . Hysteroscopy w/d&c  07-20-12   Past Medical History  Diagnosis Date  . DEPRESSIVE DISORDER 07/26/2007  . UTI 05/11/2007  . Lump or mass in breast 11/14/2008  . 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 Cervicalgia  07/26/2007  . BACK PAIN 07/26/2007  . Palpitations 09/21/2008  . Grave's disease     hx of Grave's Disease/Hyperthyroidism  . Hx of migraines   . Anxiety 06/02/2010  . Insomnia 06/02/2010  . 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 Lumbar degenerative disc disease 01/20/2011  . Hyperlipidemia 12/17/2011   BP 105/66  Pulse 76  Resp 14  Ht 5\' 6"  (1.676 m)  Wt 150 lb (68.04 kg)  BMI 24.22 kg/m2  SpO2 98%     Review of Systems  Musculoskeletal: Positive for back pain and neck pain.  Psychiatric/Behavioral: The  patient is nervous/anxious.   All other systems reviewed and are negative.       Objective:   Physical Exam  Nursing note and vitals reviewed.  Constitutional: She is oriented to person, place, and time. She appears well-developed and well-nourished.  HENT:  Head: Atraumatic. Normocephalic Neck: Normal range of motion.  Musculoskeletal:  Cervical back:  Mild cervical paraspinal tenderness  Neurological: She is alert and oriented to person, place, and time. She has normal strength. She displays no atrophy. No sensory deficit. She exhibits normal muscle tone. Coordination and gait normal.  Reflex Scores:  Tricep reflexes are 2+ on the right side and 2+ on the left side.  Bicep reflexes are 2+ on the right side and 2+ on the left side.  Brachioradialis reflexes are 2+ on the right side and 2+ on the left side.  Patellar reflexes are 2+ on the right side and 2+ on the left side.  Achilles reflexes are 2+ on the right side and 2+ on the left side. Psychiatric: She has a labile affect and cries easily when talking about  pain and her daily activities.        Assessment & Plan:   1. Cervical spondylosis as well as cervical degenerative disc. MRI findings are mild. Patient complains of pain that wakes her up at night. No upper extremity radiating pain. No weakness in the upper extremities.  This has been a chronic problem and she has tried various treatments in the past. Some relief with cervical medial branch blocks in the mid cervical area.  Also reports having had good results with cervical epidural  Overall she feels that if her low back pain was better manage she can cope with her neck pain.  2. Low back pain which has responded to lumbar medial branch blocks. This indicates lumbar facet mediated pain. Will repeat medial branch blocks has been greater than one year  2. Migraine headaches topamax, has schedule of dosage escalation by Dr Vela Prose, neurology. Reports that MRI head is  planned if Topamax 100mg  is not helpful. Started taking Klonopin 1mg  qhs which is helping with sleep. Certainly is an anxiety component here. PCP has been prescribing Klonopin for months.    Long discussion with patient as well as her husband regarding the multifocal nature of her pain and that no 1 treatment will help her. Her overall goal is to reduce narcotic analgesic medications. She has a goal of no more than 8 tablets per month. I also stressed the importance of regular physical activity such as walking her bike riding. She used to do this in got out of the habit because of poor sleep. I believe that this has something to do with her problems as well  Return to clinic in approximately 3-4 weeks. She has no other prescription for both hydrocodone as well as OxyContin from her PCP

## 2013-02-06 NOTE — Patient Instructions (Addendum)
Please start exercise again

## 2013-02-06 NOTE — Telephone Encounter (Signed)
Pt took a written RX for oxycotin dated to fill on Jan 3.  She is going out of town on dec 31.  The pharmacy wants to know if it can be filled before Dec. 31.

## 2013-02-06 NOTE — Telephone Encounter (Signed)
Ok for early refill 

## 2013-02-07 NOTE — Telephone Encounter (Signed)
Pharmacist informed. 

## 2013-02-21 ENCOUNTER — Other Ambulatory Visit: Payer: Self-pay | Admitting: Neurology

## 2013-02-21 DIAGNOSIS — R51 Headache: Principal | ICD-10-CM

## 2013-02-21 DIAGNOSIS — R519 Headache, unspecified: Secondary | ICD-10-CM

## 2013-02-27 ENCOUNTER — Encounter: Payer: Self-pay | Admitting: Physical Medicine & Rehabilitation

## 2013-02-27 ENCOUNTER — Encounter: Payer: BC Managed Care – PPO | Attending: Physical Medicine and Rehabilitation

## 2013-02-27 ENCOUNTER — Ambulatory Visit (HOSPITAL_BASED_OUTPATIENT_CLINIC_OR_DEPARTMENT_OTHER): Payer: BC Managed Care – PPO | Admitting: Physical Medicine & Rehabilitation

## 2013-02-27 VITALS — BP 113/68 | HR 97 | Resp 14 | Ht 66.0 in | Wt 150.0 lb

## 2013-02-27 DIAGNOSIS — M47812 Spondylosis without myelopathy or radiculopathy, cervical region: Secondary | ICD-10-CM

## 2013-02-27 DIAGNOSIS — M47817 Spondylosis without myelopathy or radiculopathy, lumbosacral region: Secondary | ICD-10-CM | POA: Insufficient documentation

## 2013-02-27 DIAGNOSIS — M47816 Spondylosis without myelopathy or radiculopathy, lumbar region: Secondary | ICD-10-CM

## 2013-02-27 DIAGNOSIS — G43909 Migraine, unspecified, not intractable, without status migrainosus: Secondary | ICD-10-CM | POA: Insufficient documentation

## 2013-02-27 DIAGNOSIS — IMO0002 Reserved for concepts with insufficient information to code with codable children: Secondary | ICD-10-CM

## 2013-02-27 DIAGNOSIS — G43709 Chronic migraine without aura, not intractable, without status migrainosus: Secondary | ICD-10-CM

## 2013-02-27 DIAGNOSIS — M5380 Other specified dorsopathies, site unspecified: Secondary | ICD-10-CM

## 2013-02-27 MED ORDER — HYDROCODONE-ACETAMINOPHEN 7.5-325 MG PO TABS: 1.0000 | ORAL_TABLET | Freq: Two times a day (BID) | ORAL | Status: DC | PRN

## 2013-02-27 NOTE — Progress Notes (Signed)
Subjective:    Patient ID: Cassandra Gallagher, female    DOB: July 17, 1960, 53 y.o.   MRN: [id number]  HPI Patient returns today. Indicates that she is sleeping better now that for migraines or under better control. No significant neck pain reported at the current time. Has increased her Topamax dose to 150 mg. This is prescribed by her neurologist. She continues have severe low back pain. She continues to exercise on a regular basis. She has responded favorably in the past 2 lumbar medial branch blocks last performance about one year ago Pain Inventory Average Pain 7 Pain Right Now 2 My pain is constant, burning, dull and aching  In the last 24 hours, has pain interfered with the following? General activity 4 Relation with others 4 Enjoyment of life 4 What TIME of day is your pain at its worst? morning, night Sleep (in general) Fair  Pain is worse with: inactivity, unsure and walking Pain improves with: na Relief from Meds: 6  Mobility walk without assistance ability to climb steps?  yes do you drive?  yes  Function employed # of hrs/week na  Neuro/Psych tingling anxiety  Prior Studies Any changes since last visit?  no  Physicians involved in your care Any changes since last visit?  no   Family History  Problem Relation Age of Onset  . Hypertension Mother    History   Social History  . Marital Status: Significant Other    Spouse Name: N/A    Number of Children: 2  . 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 Smoking status: Former Research scientist (life sciences)  . Smokeless tobacco: Never Used     Comment: smoked for 6 years, quit 15 years ago  . Alcohol Use: No  . Drug Use: No  . Sexual Activity: None   Other Topics Concern  . None   Social History Narrative   2nd child born recently in September 2010.   Married, one child from previous marriage lives in [place]   Past Surgical History  Procedure Laterality  Date  . Breast biopsy    . Cesarean section  10/29/2008    twins  . Dilitation & currettage/hystroscopy with versapoint resection N/A 07/20/2012    Procedure: DILATATION & CURETTAGE/HYSTEROSCOPY WITH VERSAPOINT RESECTION;  Surgeon: Princess Bruins, MD;  Location: [facility];  Service: Gynecology;  Laterality: N/A;  . Hysteroscopy w/d&c  07-20-12   Past Medical History  Diagnosis Date  . DEPRESSIVE DISORDER 07/26/2007  . UTI 05/11/2007  . Lump or mass in breast 11/14/2008  . 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 Cervicalgia 07/26/2007  . BACK PAIN 07/26/2007  . Palpitations 09/21/2008  . Grave's disease     hx of Grave's Disease/Hyperthyroidism  . Hx of migraines   . Anxiety 06/02/2010  . Insomnia 06/02/2010  . 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 Lumbar degenerative disc disease 01/20/2011  . Hyperlipidemia 12/17/2011   BP 113/68  Pulse 97  Resp 14  Ht 5\' 6"  (1.676 m)  Wt 150 lb (68.04 kg)  BMI 24.22 kg/m2  SpO2 98%     Review of Systems  Musculoskeletal: Positive for back pain and neck pain.  Neurological:       Tingling  Psychiatric/Behavioral: The patient is nervous/anxious.   All other systems reviewed and are negative.       Objective:   Physical Exam  Nursing note and vitals reviewed. Constitutional: She  is oriented to person, place, and time. She appears well-developed and well-nourished.  HENT:  Head: Normocephalic and atraumatic.  Eyes: Conjunctivae and EOM are normal. Pupils are equal, round, and reactive to light.  Musculoskeletal:       Lumbar back: She exhibits pain. She exhibits normal range of motion and no tenderness.  Pain with extension in the lumbar area as well as lateral bending. No pain with flexion  Neurological: She is alert and oriented to person, place, and time. She has normal strength.  Psychiatric: She has a normal mood and affect.          Assessment & Plan:  1. Lumbar pain facet mediated has responded in the past  lumbar medial branch blocks will repeat medial branch blocks later in month.  2. Chronic multifactorial pain including cervical spondylosis, lumbar spondylosis, chronic headaches, continues on OxyContin 10 mg twice a day as well as hydrocodone 7.5 mg twice a day. Her overall goal is to reduce her narcotic analgesics to no more than a tablets per month. We also discussed the in additional goal would be to switch to a schedule 3 or schedule 4 narcotic analgesic.  Plan to reduce hydrocodone 5 mg twice a day after medial branch blocks

## 2013-02-27 NOTE — Patient Instructions (Signed)
Next visit will reduce hydrocodone to 5 mg

## 2013-03-01 ENCOUNTER — Encounter: Payer: Self-pay | Admitting: Physical Medicine & Rehabilitation

## 2013-03-03 ENCOUNTER — Ambulatory Visit
Admission: RE | Admit: 2013-03-03 | Discharge: 2013-03-03 | Disposition: A | Discharge status: Home / Self Care | Payer: BC Managed Care – PPO | Source: Ambulatory Visit | Attending: Neurology | Admitting: Neurology

## 2013-03-03 DIAGNOSIS — R519 Headache, unspecified: Secondary | ICD-10-CM

## 2013-03-03 DIAGNOSIS — R51 Headache: Principal | ICD-10-CM

## 2013-03-09 ENCOUNTER — Ambulatory Visit (HOSPITAL_BASED_OUTPATIENT_CLINIC_OR_DEPARTMENT_OTHER): Payer: BC Managed Care – PPO | Admitting: Physical Medicine & Rehabilitation

## 2013-03-09 ENCOUNTER — Encounter: Payer: Self-pay | Admitting: Physical Medicine & Rehabilitation

## 2013-03-09 VITALS — BP 136/82 | HR 81 | Resp 14 | Ht 66.0 in | Wt 150.0 lb

## 2013-03-09 DIAGNOSIS — M47817 Spondylosis without myelopathy or radiculopathy, lumbosacral region: Secondary | ICD-10-CM

## 2013-03-09 MED ORDER — OXYCODONE HCL ER 10 MG PO T12A: 10.0000 mg | EXTENDED_RELEASE_TABLET | Freq: Two times a day (BID) | ORAL | Status: DC

## 2013-03-09 MED ORDER — HYDROCODONE-ACETAMINOPHEN 5-325 MG PO TABS: 1.0000 | ORAL_TABLET | Freq: Four times a day (QID) | ORAL | Status: DC | PRN

## 2013-03-09 MED ORDER — HYDROCODONE-ACETAMINOPHEN 7.5-325 MG PO TABS: 1.0000 | ORAL_TABLET | Freq: Two times a day (BID) | ORAL | Status: DC | PRN

## 2013-03-09 NOTE — Patient Instructions (Signed)

## 2013-03-09 NOTE — Progress Notes (Signed)
  PROCEDURE RECORD [facility]   Name: Cassandra Gallagher DOB:1960-03-08 MRN: [id number]  Date:03/09/2013  Physician: Alysia Penna, MD    Nurse/CMA: Redgie Grayer  Allergies: No Known Allergies  Consent Signed: yes  Is patient diabetic? no    Pregnant: no LMP: No LMP recorded. Patient is not currently having periods (Reason: Perimenopausal). (age 54-55)  Anticoagulants: no Anti-inflammatory: no Antibiotics: no  Procedure: Medial branch block  Position: Prone Start Time: 1102  End Time: 1116  Fluoro Time: 40  RN/CMA Omario Ander,CMA Kainan Patty,CMA    Time 1000 1120    BP 136/82 128/84    Pulse 81 81    Respirations 14 14    O2 Sat 98 99    S/S 6 6    Pain Level 5/10 0/10     D/C home with [name], [name] & O X 3, D/C instructions reviewed, and sits independently.

## 2013-03-09 NOTE — Progress Notes (Signed)

## 2013-03-29 DIAGNOSIS — G930 Cerebral cysts: Secondary | ICD-10-CM | POA: Insufficient documentation

## 2013-04-03 ENCOUNTER — Encounter: Payer: Self-pay | Admitting: Physical Medicine & Rehabilitation

## 2013-04-03 MED ORDER — OXYCODONE HCL ER 10 MG PO T12A: 10.0000 mg | EXTENDED_RELEASE_TABLET | Freq: Two times a day (BID) | ORAL | Status: DC

## 2013-04-03 MED ORDER — HYDROCODONE-ACETAMINOPHEN 5-325 MG PO TABS: 1.0000 | ORAL_TABLET | Freq: Four times a day (QID) | ORAL | Status: DC | PRN

## 2013-04-03 NOTE — Telephone Encounter (Deleted)
I am going to check to see if Dr Letta Pate has an earlier appt before you run out to avoid you having to make two trips to the office.  I will check and have the appointment desk call you.  Wenda Overland RN

## 2013-04-03 NOTE — Telephone Encounter (Signed)
Please check and see if Dr Letta Pate has an earlier appt  Next week for a follow up to prevent Ms Hogans from having to come pick up a prescription. Please call her and let her know

## 2013-04-14 ENCOUNTER — Encounter: Payer: BC Managed Care – PPO | Attending: Physical Medicine and Rehabilitation

## 2013-04-14 ENCOUNTER — Ambulatory Visit (HOSPITAL_BASED_OUTPATIENT_CLINIC_OR_DEPARTMENT_OTHER): Payer: BC Managed Care – PPO | Admitting: Physical Medicine & Rehabilitation

## 2013-04-14 ENCOUNTER — Encounter: Payer: Self-pay | Admitting: Physical Medicine & Rehabilitation

## 2013-04-14 VITALS — BP 115/71 | HR 79 | Resp 14 | Ht 66.0 in | Wt 151.0 lb

## 2013-04-14 DIAGNOSIS — G8929 Other chronic pain: Secondary | ICD-10-CM

## 2013-04-14 DIAGNOSIS — G43909 Migraine, unspecified, not intractable, without status migrainosus: Secondary | ICD-10-CM | POA: Insufficient documentation

## 2013-04-14 DIAGNOSIS — M47812 Spondylosis without myelopathy or radiculopathy, cervical region: Secondary | ICD-10-CM

## 2013-04-14 DIAGNOSIS — M5137 Other intervertebral disc degeneration, lumbosacral region: Secondary | ICD-10-CM

## 2013-04-14 DIAGNOSIS — M5136 Other intervertebral disc degeneration, lumbar region: Secondary | ICD-10-CM

## 2013-04-14 DIAGNOSIS — M47817 Spondylosis without myelopathy or radiculopathy, lumbosacral region: Secondary | ICD-10-CM | POA: Insufficient documentation

## 2013-04-14 MED ORDER — OXYCODONE HCL ER 10 MG PO T12A: 10.0000 mg | EXTENDED_RELEASE_TABLET | Freq: Two times a day (BID) | ORAL | Status: DC

## 2013-04-14 MED ORDER — HYDROCODONE-ACETAMINOPHEN 5-325 MG PO TABS: 1.0000 | ORAL_TABLET | Freq: Four times a day (QID) | ORAL | Status: DC | PRN

## 2013-04-14 NOTE — Progress Notes (Signed)
Subjective:    Patient ID: Cassandra Gallagher, female    DOB: Jun 26, 1960, 53 y.o.   MRN: [id number]  HPI Left side lump overall pain improved after MBB Jan29 Injection still seems to be working  Following up with Dr Melton Alar for neck pain and Headache, was on Topamax 350mg  which was too much, back to 225mg .  Has f/u appt with Dr Melton Alar  Dr. Arnoldo Morale Seeing her in f/u for arachnoid cyst    Pain Inventory Average Pain 7 Pain Right Now 3 My pain is n/a  In the last 24 hours, has pain interfered with the following? General activity 7 Relation with others 7 Enjoyment of life 7 What TIME of day is your pain at its worst? morning and night Sleep (in general) Fair  Pain is worse with: standing and lying down Pain improves with: heat/ice, medication and injections Relief from Meds: 7  Mobility ability to climb steps?  yes Do you have any goals in this area?  yes  Function employed # of hrs/week 30  Neuro/Psych anxiety  Prior Studies CT/MRI  Physicians involved in your care Dr Arnoldo Morale   Family History  Problem Relation Age of Onset  . Hypertension Mother    History   Social History  . Marital Status: Significant Other    Spouse Name: N/A    Number of Children: 2  . 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 Smoking status: Former Research scientist (life sciences)  . Smokeless tobacco: Never Used     Comment: smoked for 6 years, quit 15 years ago  . Alcohol Use: No  . Drug Use: No  . Sexual Activity: None   Other Topics Concern  . None   Social History Narrative   2nd child born recently in September 2010.   Married, one child from previous marriage lives in [place]   Past Surgical History  Procedure Laterality Date  . Breast biopsy    . Cesarean section  10/29/2008    twins  . Dilitation & currettage/hystroscopy with versapoint resection N/A 07/20/2012    Procedure: DILATATION & CURETTAGE/HYSTEROSCOPY WITH VERSAPOINT  RESECTION;  Surgeon: Princess Bruins, MD;  Location: [facility];  Service: Gynecology;  Laterality: N/A;  . Hysteroscopy w/d&c  07-20-12   Past Medical History  Diagnosis Date  . DEPRESSIVE DISORDER 07/26/2007  . UTI 05/11/2007  . Lump or mass in breast 11/14/2008  . 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 Cervicalgia 07/26/2007  . BACK PAIN 07/26/2007  . Palpitations 09/21/2008  . Grave's disease     hx of Grave's Disease/Hyperthyroidism  . Hx of migraines   . Anxiety 06/02/2010  . Insomnia 06/02/2010  . 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 Lumbar degenerative disc disease 01/20/2011  . Hyperlipidemia 12/17/2011   BP 115/71  Pulse 79  Resp 14  Ht 5\' 6"  (1.676 m)  Wt 151 lb (68.493 kg)  BMI 24.38 kg/m2  SpO2 98%  Opioid Risk Score:   Fall Risk Score: Low Fall Risk (0-5 points) (patient educated handout declined)   Review of Systems  Musculoskeletal: Positive for back pain.  Psychiatric/Behavioral: The patient is nervous/anxious.   All other systems reviewed and are negative.       Objective:   Physical Exam  Nursing note and vitals reviewed. Constitutional: She appears well-developed and well-nourished.   No pain to palpation in the lumbar paraspinals. Good lumbar range of motion flexion extension and  lateral bending and rotation. Negative straight leg raising Normal extremity strength       Assessment & Plan:  1.  Lumbar spondylosis doing better today after medial branch blocks in January, previous set of blocks lasted for months. We discussed radio frequency neurotomy should be medial branch block affect be  shorter term. Will monitor duration of the fact  2. Chronic multifactorial pain including cervical spondylosis, lumbar spondylosis, chronic headaches, continues on OxyContin 10 mg twice a day as well as hydrocodone 7.5 mg twice a day. RTC 56month  3.  chronic headaches being seen by neurology as well as neurosurgery. I referred both her and her  husband to these other physicians regarding any questions about headache etiology

## 2013-04-18 DIAGNOSIS — M5126 Other intervertebral disc displacement, lumbar region: Secondary | ICD-10-CM | POA: Insufficient documentation

## 2013-04-18 DIAGNOSIS — M5417 Radiculopathy, lumbosacral region: Secondary | ICD-10-CM | POA: Insufficient documentation

## 2013-04-27 ENCOUNTER — Other Ambulatory Visit: Payer: Self-pay | Admitting: Internal Medicine

## 2013-05-04 DIAGNOSIS — M542 Cervicalgia: Secondary | ICD-10-CM | POA: Insufficient documentation

## 2013-05-04 DIAGNOSIS — R51 Headache: Secondary | ICD-10-CM

## 2013-05-04 DIAGNOSIS — M5481 Occipital neuralgia: Secondary | ICD-10-CM | POA: Insufficient documentation

## 2013-05-04 DIAGNOSIS — R519 Headache, unspecified: Secondary | ICD-10-CM | POA: Insufficient documentation

## 2013-05-25 ENCOUNTER — Encounter: Payer: Self-pay | Admitting: Physical Medicine & Rehabilitation

## 2013-05-26 ENCOUNTER — Other Ambulatory Visit: Payer: Self-pay | Admitting: Internal Medicine

## 2013-05-26 NOTE — Telephone Encounter (Signed)
Ok to refill? Last filled 01/12/13 #60 with 3 RF.

## 2013-05-26 NOTE — Telephone Encounter (Signed)
Done hardcopy to [facility]  

## 2013-05-29 NOTE — Telephone Encounter (Signed)
Faxed script back to [facility]...lmb 

## 2013-05-30 ENCOUNTER — Encounter: Payer: BC Managed Care – PPO | Attending: Physical Medicine and Rehabilitation

## 2013-05-30 ENCOUNTER — Encounter: Payer: Self-pay | Admitting: Physical Medicine & Rehabilitation

## 2013-05-30 ENCOUNTER — Ambulatory Visit (HOSPITAL_BASED_OUTPATIENT_CLINIC_OR_DEPARTMENT_OTHER): Payer: BC Managed Care – PPO | Admitting: Physical Medicine & Rehabilitation

## 2013-05-30 VITALS — BP 125/76 | HR 96 | Resp 14 | Ht 66.0 in | Wt 147.4 lb

## 2013-05-30 DIAGNOSIS — G43909 Migraine, unspecified, not intractable, without status migrainosus: Secondary | ICD-10-CM | POA: Insufficient documentation

## 2013-05-30 DIAGNOSIS — M542 Cervicalgia: Secondary | ICD-10-CM

## 2013-05-30 DIAGNOSIS — M47817 Spondylosis without myelopathy or radiculopathy, lumbosacral region: Secondary | ICD-10-CM | POA: Insufficient documentation

## 2013-05-30 DIAGNOSIS — R51 Headache: Secondary | ICD-10-CM

## 2013-05-30 DIAGNOSIS — G8929 Other chronic pain: Secondary | ICD-10-CM

## 2013-05-30 DIAGNOSIS — M47812 Spondylosis without myelopathy or radiculopathy, cervical region: Secondary | ICD-10-CM | POA: Insufficient documentation

## 2013-05-30 DIAGNOSIS — R519 Headache, unspecified: Secondary | ICD-10-CM

## 2013-05-30 DIAGNOSIS — M545 Low back pain, unspecified: Secondary | ICD-10-CM

## 2013-05-30 MED ORDER — HYDROCODONE-ACETAMINOPHEN 5-325 MG PO TABS: 1.0000 | ORAL_TABLET | Freq: Four times a day (QID) | ORAL | Status: DC | PRN

## 2013-05-30 MED ORDER — OXYCODONE HCL ER 10 MG PO T12A: 10.0000 mg | EXTENDED_RELEASE_TABLET | Freq: Two times a day (BID) | ORAL | Status: DC

## 2013-05-30 NOTE — Patient Instructions (Signed)
We may make further reductions and hydrocodone number of tablets next month

## 2013-05-30 NOTE — Progress Notes (Signed)
Subjective:    Patient ID: Cassandra Gallagher, female    DOB: Jun 09, 1960, 53 y.o.   MRN: [id number]  HPI Left > Right side low back pain overall after MBB Mar 09, 2013  Sees Dr Melton Alar, on topamax and neurontin  F/u MRI no arachnoid cyst  Saw neurologist at [facility] did occipital nerve block, diagnosed occipital neuralgia  Pain Inventory Average Pain 8 Pain Right Now 3 My pain is constant  In the last 24 hours, has pain interfered with the following? General activity 6 Relation with others 6 Enjoyment of life 7 What TIME of day is your pain at its worst? all Sleep (in general) Poor  Pain is worse with: some activites Pain improves with: heat/ice, medication and TENS Relief from Meds: 8  Mobility walk without assistance  Function employed # of hrs/week 35  Neuro/Psych anxiety  Prior Studies Any changes since last visit?  no  Physicians involved in your care Any changes since last visit?  no   Family History  Problem Relation Age of Onset  . Hypertension Mother    History   Social History  . Marital Status: Significant Other    Spouse Name: N/A    Number of Children: 2  . 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 Smoking status: Former Research scientist (life sciences)  . Smokeless tobacco: Never Used     Comment: smoked for 6 years, quit 15 years ago  . Alcohol Use: No  . Drug Use: No  . Sexual Activity: None   Other Topics Concern  . None   Social History Narrative   2nd child born recently in September 2010.   Married, one child from previous marriage lives in [place]   Past Surgical History  Procedure Laterality Date  . Breast biopsy    . Cesarean section  10/29/2008    twins  . Dilitation & currettage/hystroscopy with versapoint resection N/A 07/20/2012    Procedure: DILATATION & CURETTAGE/HYSTEROSCOPY WITH VERSAPOINT RESECTION;  Surgeon: Princess Bruins, MD;  Location: [facility];  Service:  Gynecology;  Laterality: N/A;  . Hysteroscopy w/d&c  07-20-12   Past Medical History  Diagnosis Date  . DEPRESSIVE DISORDER 07/26/2007  . UTI 05/11/2007  . Lump or mass in breast 11/14/2008  . 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 Cervicalgia 07/26/2007  . BACK PAIN 07/26/2007  . Palpitations 09/21/2008  . Grave's disease     hx of Grave's Disease/Hyperthyroidism  . Hx of migraines   . Anxiety 06/02/2010  . Insomnia 06/02/2010  . 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 Lumbar degenerative disc disease 01/20/2011  . Hyperlipidemia 12/17/2011   BP 125/76  Pulse 96  Resp 14  Ht 5\' 6"  (1.676 m)  Wt 147 lb 6.4 oz (66.86 kg)  BMI 23.80 kg/m2  SpO2 98%  Opioid Risk Score:   Fall Risk Score: Low Fall Risk (0-5 points) (offered but declined inromation on preventing falls) Review of Systems  Musculoskeletal: Positive for neck pain.  Psychiatric/Behavioral: The patient is nervous/anxious.   All other systems reviewed and are negative.      Objective:   Physical Exam  Musculoskeletal:       Lumbar back: She exhibits normal range of motion.   No tenderness to palpation in the lumbar paraspinal muscles. No tenderness to palpation the cervical paraspinal muscles. There is tenderness bilateral suboccipital area but no radiation to the scalp. Upper extremity strength  is normal Lower extremity strength is normal Deep tendon reflexes in the lower extremities are normal Hip range of motion is full and without pain bilaterally       Assessment & Plan:  1. Lumbar spondylosis with chronic low back pain overall improved after medial branch block approximately 3 months ago. If her pain starts to increase once again may benefit from repeat medial branch blocks. 2. Chronic head and neck pain. New diagnosis of occipital neuralgia, patient will be changing her neurologist. 3. Chronic pain syndrome overall doing better. Will reduce hydrocodone 5 mg to 40 tablets per month rather than  60. Continue oxycodone extended release 10 mg twice a day #60 per month

## 2013-06-24 ENCOUNTER — Encounter: Payer: Self-pay | Admitting: Physical Medicine & Rehabilitation

## 2013-06-27 ENCOUNTER — Telehealth: Payer: Self-pay

## 2013-06-27 MED ORDER — HYDROCODONE-ACETAMINOPHEN 5-325 MG PO TABS: 1.0000 | ORAL_TABLET | Freq: Four times a day (QID) | ORAL | Status: DC | PRN

## 2013-06-27 MED ORDER — OXYCODONE HCL ER 10 MG PO T12A: 10.0000 mg | EXTENDED_RELEASE_TABLET | Freq: Two times a day (BID) | ORAL | Status: DC

## 2013-06-27 NOTE — Telephone Encounter (Signed)
Patient is requesting a refill on Oxycodone and Hydrocodone. Last fill date 4/21. Patient has a appt on 5/22 but will be out of medication before then. Printed RX for Dr. Letta Pate to sign. Will contact patient when ready for pickup.

## 2013-06-27 NOTE — Telephone Encounter (Signed)
Contacted patient to inform her that the refills she requested for Oxycodone and Hydrocodone are ready for pickup.

## 2013-06-30 ENCOUNTER — Ambulatory Visit (HOSPITAL_BASED_OUTPATIENT_CLINIC_OR_DEPARTMENT_OTHER): Payer: BC Managed Care – PPO | Admitting: Physical Medicine & Rehabilitation

## 2013-06-30 ENCOUNTER — Encounter: Payer: Self-pay | Admitting: Physical Medicine & Rehabilitation

## 2013-06-30 ENCOUNTER — Ambulatory Visit: Payer: BC Managed Care – PPO | Admitting: Physical Medicine & Rehabilitation

## 2013-06-30 ENCOUNTER — Encounter: Payer: BC Managed Care – PPO | Attending: Physical Medicine and Rehabilitation

## 2013-06-30 VITALS — BP 123/61 | HR 81 | Resp 14 | Ht 66.0 in | Wt 153.0 lb

## 2013-06-30 DIAGNOSIS — M47817 Spondylosis without myelopathy or radiculopathy, lumbosacral region: Secondary | ICD-10-CM

## 2013-06-30 DIAGNOSIS — G43909 Migraine, unspecified, not intractable, without status migrainosus: Secondary | ICD-10-CM | POA: Insufficient documentation

## 2013-06-30 DIAGNOSIS — M47812 Spondylosis without myelopathy or radiculopathy, cervical region: Secondary | ICD-10-CM

## 2013-06-30 MED ORDER — HYDROCODONE-ACETAMINOPHEN 5-325 MG PO TABS: 1.0000 | ORAL_TABLET | Freq: Four times a day (QID) | ORAL | Status: DC | PRN

## 2013-06-30 MED ORDER — OXYCODONE HCL ER 10 MG PO T12A: 10.0000 mg | EXTENDED_RELEASE_TABLET | Freq: Two times a day (BID) | ORAL | Status: DC

## 2013-06-30 NOTE — Progress Notes (Signed)
Subjective:    Patient ID: Cassandra Gallagher, female    DOB: 04-02-1960, 53 y.o.   MRN: [id number]  HPI Waiting for authorization for botox, Dr at [facility] seeing Dr Melton Alar neurontin changed to Lyrica, cont on Topamax, seems to think this is working    Pain Inventory Average Pain 7 Pain Right Now 3 My pain is constant, tingling, aching and other  In the last 24 hours, has pain interfered with the following? General activity 6 Relation with others 6 Enjoyment of life 6 What TIME of day is your pain at its worst? morning, evening Sleep (in general) Poor  Pain is worse with: standing Pain improves with: rest, heat/ice, therapy/exercise, medication and injections Relief from Meds: 8  Mobility walk without assistance ability to climb steps?  yes do you drive?  yes transfers alone  Function employed # of hrs/week na  Neuro/Psych tingling anxiety  Prior Studies Any changes since last visit?  no  Physicians involved in your care Any changes since last visit?  no   Family History  Problem Relation Age of Onset  . Hypertension Mother    History   Social History  . Marital Status: Significant Other    Spouse Name: N/A    Number of Children: 2  . 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 Smoking status: Former Research scientist (life sciences)  . Smokeless tobacco: Never Used     Comment: smoked for 6 years, quit 15 years ago  . Alcohol Use: No  . Drug Use: No  . Sexual Activity: None   Other Topics Concern  . None   Social History Narrative   2nd child born recently in September 2010.   Married, one child from previous marriage lives in [place]   Past Surgical History  Procedure Laterality Date  . Breast biopsy    . Cesarean section  10/29/2008    twins  . Dilitation & currettage/hystroscopy with versapoint resection N/A 07/20/2012    Procedure: DILATATION & CURETTAGE/HYSTEROSCOPY WITH VERSAPOINT  RESECTION;  Surgeon: Princess Bruins, MD;  Location: [facility];  Service: Gynecology;  Laterality: N/A;  . Hysteroscopy w/d&c  07-20-12   Past Medical History  Diagnosis Date  . DEPRESSIVE DISORDER 07/26/2007  . UTI 05/11/2007  . Lump or mass in breast 11/14/2008  . 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 Cervicalgia 07/26/2007  . BACK PAIN 07/26/2007  . Palpitations 09/21/2008  . Grave's disease     hx of Grave's Disease/Hyperthyroidism  . Hx of migraines   . Anxiety 06/02/2010  . Insomnia 06/02/2010  . 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 Lumbar degenerative disc disease 01/20/2011  . Hyperlipidemia 12/17/2011   BP 123/61  Pulse 81  Resp 14  Ht 5\' 6"  (1.676 m)  Wt 153 lb (69.4 kg)  BMI 24.71 kg/m2  SpO2 97%  Opioid Risk Score:   Fall Risk Score: Low Fall Risk (0-5 points) (pt educated on fallrisk, pt given a brochure previously)    Review of Systems  Musculoskeletal: Positive for back pain.  Neurological:       Tingling  Psychiatric/Behavioral: The patient is nervous/anxious.   All other systems reviewed and are negative.      Objective:   Physical Exam  Nursing note and vitals reviewed. Constitutional: She is oriented to person, place, and time. She appears well-developed and well-nourished.  HENT:  Head: Normocephalic and atraumatic.  Eyes:  Conjunctivae and EOM are normal. Pupils are equal, round, and reactive to light.  Neck: Normal range of motion.  Neurological: She is alert and oriented to person, place, and time.  Psychiatric: She has a normal mood and affect.   Lumbar ROM is nl Lumbar        Assessment & Plan:  1. Lumbar spondylosis with chronic low back pain overall improved after medial branch block approximately 4 months ago. If her pain starts to increase once again may benefit from repeat medial branch blocks.  2. Chronic pain syndrome overall doing better. Will reduce hydrocodone 5 mg to 40 tablets per month rather than 60.  Continue oxycodone extended release 10 mg twice a day #60 per month Lyrica helping with overall  Pain sensitivity, has been able to skip both oxycontin and hydrocodone doses from time to time  Patient just received oxycodone extended release #60 tablets as well as hydrocodone 5 mg #40 tablets on 06/27/2013. Patient will be leaving for [place] on 07/25/2013 and will not be back until 08/30/2013  Patient feels she has enough medicine if she can take Lyrica as well  RTC2 months

## 2013-06-30 NOTE — Patient Instructions (Signed)
Prescription given today for both OxyContin and hydrocodone. Previous prescription on 06/27/2013 for the same medicines. This should get it through  July when you returned from [place]

## 2013-07-05 ENCOUNTER — Other Ambulatory Visit: Payer: Self-pay | Admitting: Internal Medicine

## 2013-07-17 ENCOUNTER — Other Ambulatory Visit: Payer: Self-pay | Admitting: Internal Medicine

## 2013-07-21 ENCOUNTER — Ambulatory Visit
Admission: RE | Admit: 2013-07-21 | Discharge: 2013-07-21 | Disposition: A | Discharge status: Home / Self Care | Payer: BC Managed Care – PPO | Source: Ambulatory Visit | Attending: Neurology | Admitting: Neurology

## 2013-07-21 ENCOUNTER — Other Ambulatory Visit: Payer: Self-pay | Admitting: Neurology

## 2013-07-21 DIAGNOSIS — M542 Cervicalgia: Secondary | ICD-10-CM

## 2013-07-24 ENCOUNTER — Telehealth: Payer: Self-pay

## 2013-07-24 NOTE — Telephone Encounter (Signed)
Pharmacy states that patient's insurance will not cover Oxycodone RX. Pharmacist states that patient has reached her plan limit and insurance is only wanting to give patient Oxycodone #30. Insurance company is requesting a "PLA" Contact number is [phone].

## 2013-07-24 NOTE — Telephone Encounter (Signed)
Spoke with insurance.  They say the pharmacy needs to call and get an override.  Per insurance its just to early to fill rx.  Spoke with pharmacy and they are going to call insurance ago.

## 2013-08-30 ENCOUNTER — Telehealth: Payer: Self-pay | Admitting: *Deleted

## 2013-08-30 ENCOUNTER — Encounter: Payer: Self-pay | Admitting: Physical Medicine & Rehabilitation

## 2013-08-30 MED ORDER — HYDROCODONE-ACETAMINOPHEN 5-325 MG PO TABS: 1.0000 | ORAL_TABLET | Freq: Four times a day (QID) | ORAL | Status: DC | PRN

## 2013-08-30 MED ORDER — OXYCODONE HCL ER 10 MG PO T12A: 10.0000 mg | EXTENDED_RELEASE_TABLET | Freq: Two times a day (BID) | ORAL | Status: DC

## 2013-08-30 NOTE — Telephone Encounter (Signed)
Refill medications Oxycodone and Hydrocodone.  She has an appt 09/07/13 with Kirsteins. Request was through [facility] email and she is aware she will be able to pick them up.

## 2013-09-07 ENCOUNTER — Ambulatory Visit (HOSPITAL_BASED_OUTPATIENT_CLINIC_OR_DEPARTMENT_OTHER): Payer: BC Managed Care – PPO | Admitting: Physical Medicine & Rehabilitation

## 2013-09-07 ENCOUNTER — Encounter: Payer: Self-pay | Admitting: Physical Medicine & Rehabilitation

## 2013-09-07 ENCOUNTER — Encounter: Payer: BC Managed Care – PPO | Attending: Physical Medicine and Rehabilitation

## 2013-09-07 VITALS — BP 100/74 | HR 74 | Resp 14 | Ht 66.0 in | Wt 155.0 lb

## 2013-09-07 DIAGNOSIS — M47812 Spondylosis without myelopathy or radiculopathy, cervical region: Secondary | ICD-10-CM

## 2013-09-07 DIAGNOSIS — M47817 Spondylosis without myelopathy or radiculopathy, lumbosacral region: Secondary | ICD-10-CM | POA: Insufficient documentation

## 2013-09-07 DIAGNOSIS — G43909 Migraine, unspecified, not intractable, without status migrainosus: Secondary | ICD-10-CM | POA: Insufficient documentation

## 2013-09-07 DIAGNOSIS — G8929 Other chronic pain: Secondary | ICD-10-CM

## 2013-09-07 DIAGNOSIS — R519 Headache, unspecified: Secondary | ICD-10-CM

## 2013-09-07 DIAGNOSIS — R51 Headache: Secondary | ICD-10-CM

## 2013-09-07 MED ORDER — OXYCODONE HCL ER 10 MG PO T12A: 10.0000 mg | EXTENDED_RELEASE_TABLET | Freq: Every morning | ORAL | Status: DC

## 2013-09-07 MED ORDER — HYDROCODONE-ACETAMINOPHEN 5-325 MG PO TABS: 1.0000 | ORAL_TABLET | Freq: Three times a day (TID) | ORAL | Status: DC | PRN

## 2013-09-07 NOTE — Patient Instructions (Signed)
If EPIDURAL injection not helpful, will refer for Occipital nerve ablation

## 2013-09-07 NOTE — Progress Notes (Signed)
Subjective:    Patient ID: Cassandra Gallagher, female    DOB: 11-Aug-1960, 53 y.o.   MRN: [id number]  HPI Just got back from [place] Back feels great, was walking more in [place]  Botox to neck not helpful, has f/u with neurology  Lyrica taking less ran out when in [place]- neck pain increased, now back on Lyrica, complains of weight gain but no leg swelling   Pain Inventory Average Pain 6 Pain Right Now 6 My pain is stabbing and aching  In the last 24 hours, has pain interfered with the following? General activity 6 Relation with others 6 Enjoyment of life 6 What TIME of day is your pain at its worst? evening Sleep (in general) Poor  Pain is worse with: some activites Pain improves with: heat/ice, medication and injections Relief from Meds: 6  Mobility ability to climb steps?  yes do you drive?  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  no  Physicians involved in your care Neurologist .   Family History  Problem Relation Age of Onset  . Hypertension Mother    History   Social History  . Marital Status: Significant Other    Spouse Name: N/A    Number of Children: 2  . 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 Smoking status: Former Research scientist (life sciences)  . Smokeless tobacco: Never Used     Comment: smoked for 6 years, quit 15 years ago  . Alcohol Use: No  . Drug Use: No  . Sexual Activity: None   Other Topics Concern  . None   Social History Narrative   2nd child born recently in September 2010.   Married, one child from previous marriage lives in [place]   Past Surgical History  Procedure Laterality Date  . Breast biopsy    . Cesarean section  10/29/2008    twins  . Dilitation & currettage/hystroscopy with versapoint resection N/A 07/20/2012    Procedure: DILATATION & CURETTAGE/HYSTEROSCOPY WITH VERSAPOINT  RESECTION;  Surgeon: Princess Bruins, MD;  Location: [facility];  Service: Gynecology;  Laterality: N/A;  . Hysteroscopy w/d&c  07-20-12   Past Medical History  Diagnosis Date  . DEPRESSIVE DISORDER 07/26/2007  . UTI 05/11/2007  . Lump or mass in breast 11/14/2008  . 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 Cervicalgia 07/26/2007  . BACK PAIN 07/26/2007  . Palpitations 09/21/2008  . Grave's disease     hx of Grave's Disease/Hyperthyroidism  . Hx of migraines   . Anxiety 06/02/2010  . Insomnia 06/02/2010  . 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 Lumbar degenerative disc disease 01/20/2011  . Hyperlipidemia 12/17/2011   BP 100/74  Pulse 74  Resp 14  Ht 5\' 6"  (1.676 m)  Wt 155 lb (70.308 kg)  BMI 25.03 kg/m2  SpO2 96%  Opioid Risk Score:   Fall Risk Score: Low Fall Risk (0-5 points) (patient educated handout declined)   Review of Systems  Constitutional: Positive for unexpected weight change.  Musculoskeletal: Positive for neck pain.  Neurological: Positive for headaches.  Psychiatric/Behavioral: The patient is nervous/anxious.   All other systems reviewed and are negative.      Objective:   Physical Exam  Nursing note and vitals reviewed. Constitutional: She is oriented to person, place, and time. She appears well-developed and well-nourished.  HENT:  Head: Normocephalic and  atraumatic.  Eyes: Conjunctivae and EOM are normal. Pupils are equal, round, and reactive to light.  Neck: Neck supple.  Neurological: She is alert and oriented to person, place, and time.  Psychiatric: She has a normal mood and affect.    Lumbar spine without pain to palpation. She has full range of motion. Negative straight leg raise normal strength in the lower extremities Neck has good range of motion no pain to palpation in the cervical paraspinals or in the peri scapular musculature.  General no acute distress     Assessment & Plan:  1. Lumbar spondylosis with chronic low back  pain overall improved after medial branch block approximately 4 months ago. If her pain starts to increase once again may benefit from repeat medial branch blocks. 2.  Neck pain radiating to occiput, has been relieved in the past by both cervical ESI as well as occipital nerve block,  she had a longer lasting relief with cervical ESI therefore we will repeat. Last injection performed September 2014. If this fails consider referral to [facility] for  occipital nerve neuro lysis  Over half of the 25 min visit was spent counseling and coordinating care. Discussed medication management, procedural management, etiology of pain

## 2013-09-11 ENCOUNTER — Other Ambulatory Visit: Payer: Self-pay | Admitting: Physical Medicine & Rehabilitation

## 2013-09-11 DIAGNOSIS — M47812 Spondylosis without myelopathy or radiculopathy, cervical region: Secondary | ICD-10-CM

## 2013-10-05 ENCOUNTER — Other Ambulatory Visit: Payer: Self-pay | Admitting: Internal Medicine

## 2013-10-17 ENCOUNTER — Telehealth: Payer: Self-pay

## 2013-10-17 ENCOUNTER — Encounter: Payer: Self-pay | Admitting: Physical Medicine & Rehabilitation

## 2013-10-17 MED ORDER — HYDROCODONE-ACETAMINOPHEN 5-325 MG PO TABS: 1.0000 | ORAL_TABLET | Freq: Three times a day (TID) | ORAL | Status: DC | PRN

## 2013-10-17 MED ORDER — OXYCODONE HCL ER 10 MG PO T12A: 10.0000 mg | EXTENDED_RELEASE_TABLET | Freq: Every morning | ORAL | Status: DC

## 2013-10-17 NOTE — Telephone Encounter (Signed)
Patient requested a refill on Oxycodone and Hydrocodone through My chart. She will be out of medication before her next appt.  RX printed for Dr. Letta Pate to sign. Will contact patient when ready for pickup.

## 2013-10-17 NOTE — Telephone Encounter (Signed)
Attempted to contact patient to inform her that the Hydrocodone and Oxycodone RX will be ready for pickup on 9/9. Left a voicemail.

## 2013-10-19 ENCOUNTER — Encounter: Payer: Self-pay | Admitting: Physical Medicine & Rehabilitation

## 2013-10-24 ENCOUNTER — Other Ambulatory Visit: Payer: BC Managed Care – PPO

## 2013-10-26 ENCOUNTER — Encounter: Payer: Self-pay | Admitting: Physical Medicine & Rehabilitation

## 2013-10-26 ENCOUNTER — Other Ambulatory Visit: Payer: Self-pay

## 2013-10-26 ENCOUNTER — Encounter: Payer: BC Managed Care – PPO | Attending: Physical Medicine and Rehabilitation

## 2013-10-26 ENCOUNTER — Ambulatory Visit (HOSPITAL_BASED_OUTPATIENT_CLINIC_OR_DEPARTMENT_OTHER): Payer: BC Managed Care – PPO | Admitting: Physical Medicine & Rehabilitation

## 2013-10-26 VITALS — BP 112/77 | HR 72 | Resp 14 | Wt 155.0 lb

## 2013-10-26 DIAGNOSIS — M542 Cervicalgia: Secondary | ICD-10-CM | POA: Diagnosis present

## 2013-10-26 DIAGNOSIS — G43709 Chronic migraine without aura, not intractable, without status migrainosus: Secondary | ICD-10-CM

## 2013-10-26 DIAGNOSIS — G43909 Migraine, unspecified, not intractable, without status migrainosus: Secondary | ICD-10-CM | POA: Insufficient documentation

## 2013-10-26 DIAGNOSIS — M47817 Spondylosis without myelopathy or radiculopathy, lumbosacral region: Secondary | ICD-10-CM | POA: Insufficient documentation

## 2013-10-26 DIAGNOSIS — M47812 Spondylosis without myelopathy or radiculopathy, cervical region: Secondary | ICD-10-CM

## 2013-10-26 DIAGNOSIS — M531 Cervicobrachial syndrome: Secondary | ICD-10-CM

## 2013-10-26 DIAGNOSIS — IMO0002 Reserved for concepts with insufficient information to code with codable children: Secondary | ICD-10-CM

## 2013-10-26 DIAGNOSIS — M5481 Occipital neuralgia: Secondary | ICD-10-CM

## 2013-10-26 MED ORDER — OXYCODONE HCL ER 10 MG PO T12A: 10.0000 mg | EXTENDED_RELEASE_TABLET | Freq: Every day | ORAL | Status: DC

## 2013-10-26 MED ORDER — HYDROCODONE-ACETAMINOPHEN 5-325 MG PO TABS: 1.0000 | ORAL_TABLET | Freq: Three times a day (TID) | ORAL | Status: DC | PRN

## 2013-10-26 NOTE — Progress Notes (Signed)
Subjective:    Patient ID: Cassandra Gallagher, female    DOB: [birth year], 53 y.o.   MRN: [id number]  HPI Repeat Botox for migraine 9/10 not helpful local neurologist for headaches and neck pain  academic neurologist for headaches Still taking hydrocodone 3 times per night Oxycontin 10mg  in am  Tried oxycontin at night which is better Pain Inventory Average Pain 8 Pain Right Now 0 My pain is constant, burning, tingling and aching  In the last 24 hours, has pain interfered with the following? General activity 7 Relation with others 7 Enjoyment of life 7 What TIME of day is your pain at its worst? night Sleep (in general) morning and night  Pain is worse with: other Pain improves with: heat/ice, medication, injections and other Relief from Meds: 8  Mobility walk without assistance ability to climb steps?  yes do you drive?  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  no  Physicians involved in your care Any changes since last visit?  no   Family History  Problem Relation Age of Onset  . Hypertension Mother    History   Social History  . Marital Status: Significant Other    Spouse Name: N/A    Number of Children: 2  . 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 Smoking status: Former Research scientist (life sciences)  . Smokeless tobacco: Never Used     Comment: smoked for 6 years, quit 15 years ago  . Alcohol Use: No  . Drug Use: No  . Sexual Activity: None   Other Topics Concern  . None   Social History Narrative   2nd child born recently in September 2010.   Married, one child from previous marriage lives in [place]   Past Surgical History  Procedure Laterality Date  . Breast biopsy    . Cesarean section  10/29/2008    twins  . Dilitation & currettage/hystroscopy with versapoint resection N/A 07/20/2012   Procedure: DILATATION & CURETTAGE/HYSTEROSCOPY WITH VERSAPOINT RESECTION;  Surgeon: Princess Bruins, MD;  Location: [facility];  Service: Gynecology;  Laterality: N/A;  . Hysteroscopy w/d&c  07-20-12   Past Medical History  Diagnosis Date  . DEPRESSIVE DISORDER 07/26/2007  . UTI 05/11/2007  . Lump or mass in breast 11/14/2008  . 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 Cervicalgia 07/26/2007  . BACK PAIN 07/26/2007  . Palpitations 09/21/2008  . Grave's disease     hx of Grave's Disease/Hyperthyroidism  . Hx of migraines   . Anxiety 06/02/2010  . Insomnia 06/02/2010  . 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 Lumbar degenerative disc disease 01/20/2011  . Hyperlipidemia 12/17/2011   BP 112/77  Pulse 72  Resp 14  Wt 155 lb (70.308 kg)  SpO2 98%  Opioid Risk Score:   Fall Risk Score: Low Fall Risk (0-5 points) (previously educated and handout declined)   Review of Systems  Psychiatric/Behavioral: The patient is nervous/anxious.   All other systems reviewed and are negative.      Objective:   Physical Exam  Cervical ROM limited extension Otherwise FROM  Lumbar FROM  Tenderness over lumbar and cervical paraspinals  Strength is 5/5 bilateral upper and lower limbs        Assessment & Plan:   1.  Lumbar spondylosis doing well, prolonged relief from medial branch block  2.  Cervical spondylosis with cervical headaches. Has responded well to cervical epidural's in the past. Has a repeat injection scheduled in the next 1-2 weeks. 3. Headaches question occipital neuralgia vs. migraine vs. combination. Goes to the neurology clinic at [facility]. She reports little relief from the Botox injection. She had good relief with occipital nerve block.

## 2013-10-26 NOTE — Patient Instructions (Addendum)
[facility] for occipital nerve neurolysis  Take oxycontin every night at bedtime

## 2013-11-03 ENCOUNTER — Ambulatory Visit
Admission: RE | Admit: 2013-11-03 | Discharge: 2013-11-03 | Disposition: A | Discharge status: Home / Self Care | Payer: BC Managed Care – PPO | Source: Ambulatory Visit | Attending: Physical Medicine & Rehabilitation | Admitting: Physical Medicine & Rehabilitation

## 2013-11-03 DIAGNOSIS — M47812 Spondylosis without myelopathy or radiculopathy, cervical region: Secondary | ICD-10-CM

## 2013-11-03 MED ORDER — IOHEXOL 300 MG/ML  SOLN
1.0000 mL | Freq: Once | INTRAMUSCULAR | Status: AC | PRN
  Administered 2013-11-03: 1 mL via EPIDURAL

## 2013-11-03 MED ORDER — TRIAMCINOLONE ACETONIDE 40 MG/ML IJ SUSP (RADIOLOGY)
60.0000 mg | Freq: Once | INTRAMUSCULAR | Status: AC
  Administered 2013-11-03: 60 mg via EPIDURAL

## 2013-11-03 NOTE — Discharge Instructions (Signed)

## 2013-11-21 ENCOUNTER — Other Ambulatory Visit: Payer: Self-pay | Admitting: Internal Medicine

## 2013-11-21 MED ORDER — RIZATRIPTAN BENZOATE 10 MG PO TBDP: ORAL_TABLET | ORAL | Status: DC

## 2013-11-21 NOTE — Telephone Encounter (Signed)
Done hardcopy to [facility]  

## 2013-11-21 NOTE — Telephone Encounter (Signed)
Faxed script back to [facility]...lmb 

## 2013-12-03 ENCOUNTER — Encounter: Payer: Self-pay | Admitting: Physical Medicine & Rehabilitation

## 2013-12-04 ENCOUNTER — Emergency Department (HOSPITAL_COMMUNITY)
Admission: EM | Admit: 2013-12-04 | Discharge: 2013-12-04 | Discharge status: Against Medical Advice | Payer: BC Managed Care – PPO

## 2013-12-04 ENCOUNTER — Emergency Department (HOSPITAL_COMMUNITY): Payer: BC Managed Care – PPO

## 2013-12-04 ENCOUNTER — Encounter (HOSPITAL_COMMUNITY): Payer: Self-pay | Admitting: Emergency Medicine

## 2013-12-04 ENCOUNTER — Emergency Department (HOSPITAL_COMMUNITY)
Admission: EM | Admit: 2013-12-04 | Discharge: 2013-12-04 | Disposition: A | Discharge status: Home / Self Care | Payer: BC Managed Care – PPO | Attending: Emergency Medicine | Admitting: Emergency Medicine

## 2013-12-04 DIAGNOSIS — G4700 Insomnia, unspecified: Secondary | ICD-10-CM | POA: Insufficient documentation

## 2013-12-04 DIAGNOSIS — R079 Chest pain, unspecified: Secondary | ICD-10-CM | POA: Diagnosis present

## 2013-12-04 DIAGNOSIS — Z8639 Personal history of other endocrine, nutritional and metabolic disease: Secondary | ICD-10-CM | POA: Diagnosis not present

## 2013-12-04 DIAGNOSIS — Z79899 Other long term (current) drug therapy: Secondary | ICD-10-CM | POA: Insufficient documentation

## 2013-12-04 DIAGNOSIS — Z87891 Personal history of nicotine dependence: Secondary | ICD-10-CM | POA: Diagnosis not present

## 2013-12-04 DIAGNOSIS — F419 Anxiety disorder, unspecified: Secondary | ICD-10-CM | POA: Diagnosis not present

## 2013-12-04 DIAGNOSIS — G43909 Migraine, unspecified, not intractable, without status migrainosus: Secondary | ICD-10-CM | POA: Insufficient documentation

## 2013-12-04 DIAGNOSIS — Z87440 Personal history of urinary (tract) infections: Secondary | ICD-10-CM | POA: Diagnosis not present

## 2013-12-04 DIAGNOSIS — M5136 Other intervertebral disc degeneration, lumbar region: Secondary | ICD-10-CM | POA: Insufficient documentation

## 2013-12-04 LAB — CBC
HCT: 41.7 % (ref 36.0–46.0)
HEMOGLOBIN: 13.9 g/dL (ref 12.0–15.0)
MCH: 31.4 pg (ref 26.0–34.0)
MCHC: 33.3 g/dL (ref 30.0–36.0)
MCV: 94.3 fL (ref 78.0–100.0)
Platelets: 249 10*3/uL (ref 150–400)
RBC: 4.42 MIL/uL (ref 3.87–5.11)
RDW: 13 % (ref 11.5–15.5)
WBC: 4.8 10*3/uL (ref 4.0–10.5)

## 2013-12-04 LAB — BASIC METABOLIC PANEL
Anion gap: 11 (ref 5–15)
BUN: 15 mg/dL (ref 6–23)
CALCIUM: 9.7 mg/dL (ref 8.4–10.5)
CO2: 24 mEq/L (ref 19–32)
CREATININE: 0.77 mg/dL (ref 0.50–1.10)
Chloride: 107 mEq/L (ref 96–112)
GFR calc Af Amer: >90 mL/min (ref >90)
GLUCOSE: 100 mg/dL — AB (ref 70–99)
Potassium: 4.1 mEq/L (ref 3.7–5.3)
Sodium: 142 mEq/L (ref 137–147)

## 2013-12-04 LAB — I-STAT TROPONIN, ED: TROPONIN I, POC: 0 ng/mL (ref 0.00–0.08)

## 2013-12-04 MED ORDER — OMEPRAZOLE 20 MG PO CPDR: 20.0000 mg | DELAYED_RELEASE_CAPSULE | Freq: Every day | ORAL | Status: DC

## 2013-12-04 MED ORDER — PANTOPRAZOLE SODIUM 40 MG PO TBEC
40.0000 mg | DELAYED_RELEASE_TABLET | Freq: Once | ORAL | Status: AC
  Administered 2013-12-04: 40 mg via ORAL
  Filled 2013-12-04: qty 1

## 2013-12-04 NOTE — ED Notes (Signed)
Pt states that she has been taking Lyrica x 4 months for migraine headaches. Pt states that she has been having chest pains and was told by her neurologist to come and be evaluated as lyrica can cause chest pain.

## 2013-12-04 NOTE — ED Notes (Signed)
Per pt sts chest pain x a few weeks. sts she was started on Lyrica a few months ago and was told that was a side effect but sent here for evaluation. sts pain more with exercise but intermittent.

## 2013-12-04 NOTE — ED Provider Notes (Signed)
CSN: [id number]     Arrival date & time 12/04/13  1541 History   First MD Initiated Contact with Patient 12/04/13 2020     Chief Complaint  Patient presents with  . Chest Pain     (Consider location/radiation/quality/duration/timing/severity/associated sxs/prior Treatment) HPI The patient has had intermittent chest pain for several weeks duration. It is left-sided discomfort feels like an ache or not. It has been coming and going. Sometimes it occurs at rest she can be sitting and watching television and have it happened. She also notes however it has happened while she was riding her bicycle. The patient rides quite long distances and is well conditioned. She said this is very atypical for her to experience any discomfort or problems while riding her bicycle. She notes that the symptoms are consistent with some of the side effects listed for Lyrica. She reports that she takes Lyrica for headaches and neck pain and it is helpful for this however she is concerned that it may be the cause of her symptoms. The patient does not experience associated symptoms with her chest discomfort.  Family history is negative for early coronary artery disease or death.  Past Medical History  Diagnosis Date  . DEPRESSIVE DISORDER 07/26/2007  . UTI 05/11/2007  . Lump or mass in breast 11/14/2008  . 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 Cervicalgia 07/26/2007  . BACK PAIN 07/26/2007  . Palpitations 09/21/2008  . Grave's disease     hx of Grave's Disease/Hyperthyroidism  . Hx of migraines   . Anxiety 06/02/2010  . Insomnia 06/02/2010  . 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 Lumbar degenerative disc disease 01/20/2011  . Hyperlipidemia 12/17/2011   Past Surgical History  Procedure Laterality Date  . Breast biopsy    . Cesarean section  10/29/2008    twins  . Dilitation & currettage/hystroscopy with versapoint resection N/A 07/20/2012    Procedure: DILATATION & CURETTAGE/HYSTEROSCOPY WITH  VERSAPOINT RESECTION;  Surgeon: Princess Bruins, MD;  Location: [facility];  Service: Gynecology;  Laterality: N/A;  . Hysteroscopy w/d&c  07-20-12   Family History  Problem Relation Age of Onset  . Hypertension Mother    History  Substance Use Topics  . Smoking status: Former Research scientist (life sciences)  . Smokeless tobacco: Never Used     Comment: smoked for 6 years, quit 15 years ago  . Alcohol Use: No   OB History   Grav Para Term Preterm Abortions TAB SAB Ect Mult Living                 Review of Systems 10 Systems reviewed and are negative for acute change except as noted in the HPI.    Allergies  Review of patient's allergies indicates no known allergies.  Home Medications   Prior to Admission medications   Medication Sig Start Date End Date Taking? Authorizing Provider  clonazePAM (KLONOPIN) 0.5 MG tablet Take 0.5 mg by mouth daily as needed for anxiety.   Yes Historical Provider, MD  COMBIPATCH 0.05-0.14 MG/DAY Place 1 patch onto the skin Every third day. 11/08/11  Yes Historical Provider, MD  estradiol (ESTRACE VAGINAL) 0.1 MG/GM vaginal cream Place 1 g vaginally. 07/17/13  Yes Historical Provider, MD  HYDROcodone-acetaminophen (NORCO/VICODIN) 5-325 MG per tablet Take 1 tablet by mouth every 8 (eight) hours as needed for moderate pain. 10/26/13  Yes Charlett Blake, MD  Multiple Vitamin (MULTIVITAMIN WITH MINERALS) TABS Take 1 tablet by mouth daily.   Yes Historical Provider, MD  Naproxen Sodium (NAPRELAN)  375 MG TB24 Take 1 tablet by mouth 2 (two) times daily as needed (for pain).    Yes Historical Provider, MD  OxyCODONE (OXYCONTIN) 10 mg T12A 12 hr tablet Take 1 tablet (10 mg total) by mouth at bedtime. 10/26/13  Yes Charlett Blake, MD  Red Yeast Rice Extract (RED YEAST RICE PO) Take 1 tablet by mouth daily.   Yes Historical Provider, MD  topiramate (TOPAMAX) 100 MG tablet Take 100 mg by mouth at bedtime.   Yes Historical Provider, MD  verapamil (CALAN) 80 MG tablet Take 80 mg by mouth  daily.  10/20/13  Yes Historical Provider, MD   BP 139/75  Pulse 68  Temp(Src) 97.4 F (36.3 C)  Resp 10  SpO2 97% Physical Exam  Constitutional: She is oriented to person, place, and time. She appears well-developed and well-nourished.  HENT:  Head: Normocephalic and atraumatic.  Eyes: EOM are normal. Pupils are equal, round, and reactive to light.  Neck: Neck supple.  Cardiovascular: Normal rate, regular rhythm, normal heart sounds and intact distal pulses.   Pulmonary/Chest: Effort normal and breath sounds normal.  Abdominal: Soft. Bowel sounds are normal. She exhibits no distension. There is no tenderness.  Musculoskeletal: Normal range of motion. She exhibits no edema.  Neurological: She is alert and oriented to person, place, and time. She has normal strength. Coordination normal. GCS eye subscore is 4. GCS verbal subscore is 5. GCS motor subscore is 6.  Skin: Skin is warm, dry and intact.  Psychiatric: She has a normal mood and affect.     ED Course  Procedures (including critical care time) Labs Review Labs Reviewed  BASIC METABOLIC PANEL - Abnormal; Notable for the following:    Glucose, Bld 100 (*)    All other components within normal limits  CBC  I-STAT TROPOININ, ED    Imaging Review Dg Chest 2 View  12/04/2013   CLINICAL DATA:  Chest pain.  Initial encounter  EXAM: CHEST  2 VIEW  COMPARISON:  None  FINDINGS: Normal heart size and mediastinal contours. No acute infiltrate or edema. No effusion or pneumothorax. No acute osseous findings.  IMPRESSION: No active cardiopulmonary disease.   Electronically Signed   By: Jorje Guild M.D.   On: 12/04/2013 18:10     EKG Interpretation   Date/Time:  Monday December 04 2013 15:50:39 EDT Ventricular Rate:  87 PR Interval:  124 QRS Duration: 80 QT Interval:  358 QTC Calculation: 430 R Axis:   84 Text Interpretation:  Normal sinus rhythm Normal ECG agree. no change.  Confirmed by Johnney Killian, MD, Jeannie Done [phone]) on  12/04/2013 9:46:25 PM      MDM   Final diagnoses:  Chest pain, unspecified chest pain type   The patient has had several weeks worth of intermittent chest pain type symptoms. They do sound atypical for ischemia. Patient does not have cardiac risk factors. Family history is negative for early cardiac disease. EKG and troponins are normal. At this point from perspective of cardiac disease I do feel patient is safe for continued outpatient evaluation with stress testing by her family physician. Other consideration is for potential GI source. The patient will be empirically started on Prilosec with recommendations for further evaluation with her family physician for response to treatment. Also as the patient links her chest pain with Lyrica and is concerned about taking it, I will have her temporarily discontinue that and address restarting it with her neurologist.    Charlesetta Shanks, MD 12/04/13 2207

## 2013-12-04 NOTE — ED Notes (Signed)
PT monitored by pulse ox, bp cuff, and 5-lead. 

## 2013-12-04 NOTE — Discharge Instructions (Signed)
Chest Pain (Nonspecific) °It is often hard to give a specific diagnosis for the cause of chest pain. There is always a chance that your pain could be related to something serious, such as a heart attack or a blood clot in the lungs. You need to follow up with your health care provider for further evaluation. °CAUSES  °· Heartburn. °· Pneumonia or bronchitis. °· Anxiety or stress. °· Inflammation around your heart (pericarditis) or lung (pleuritis or pleurisy). °· A blood clot in the lung. °· A collapsed lung (pneumothorax). It can develop suddenly on its own (spontaneous pneumothorax) or from trauma to the chest. °· Shingles infection (herpes zoster virus). °The chest wall is composed of bones, muscles, and cartilage. Any of these can be the source of the pain. °· The bones can be bruised by injury. °· The muscles or cartilage can be strained by coughing or overwork. °· The cartilage can be affected by inflammation and become sore (costochondritis). °DIAGNOSIS  °Lab tests or other studies may be needed to find the cause of your pain. Your health care provider may have you take a test called an ambulatory electrocardiogram (ECG). An ECG records your heartbeat patterns over a 24-hour period. You may also have other tests, such as: °· Transthoracic echocardiogram (TTE). During echocardiography, sound waves are used to evaluate how blood flows through your heart. °· Transesophageal echocardiogram (TEE). °· Cardiac monitoring. This allows your health care provider to monitor your heart rate and rhythm in real time. °· Holter monitor. This is a portable device that records your heartbeat and can help diagnose heart arrhythmias. It allows your health care provider to track your heart activity for several days, if needed. °· Stress tests by exercise or by giving medicine that makes the heart beat faster. °TREATMENT  °· Treatment depends on what may be causing your chest pain. Treatment may include: °¨ Acid blockers for  heartburn. °¨ Anti-inflammatory medicine. °¨ Pain medicine for inflammatory conditions. °¨ Antibiotics if an infection is present. °· You may be advised to change lifestyle habits. This includes stopping smoking and avoiding alcohol, caffeine, and chocolate. °· You may be advised to keep your head raised (elevated) when sleeping. This reduces the chance of acid going backward from your stomach into your esophagus. °Most of the time, nonspecific chest pain will improve within 2-3 days with rest and mild pain medicine.  °HOME CARE INSTRUCTIONS  °· If antibiotics were prescribed, take them as directed. Finish them even if you start to feel better. °· For the next few days, avoid physical activities that bring on chest pain. Continue physical activities as directed. °· Do not use any tobacco products, including cigarettes, chewing tobacco, or electronic cigarettes. °· Avoid drinking alcohol. °· Only take medicine as directed by your health care provider. °· Follow your health care provider's suggestions for further testing if your chest pain does not go away. °· Keep any follow-up appointments you made. If you do not go to an appointment, you could develop lasting (chronic) problems with pain. If there is any problem keeping an appointment, call to reschedule. °SEEK MEDICAL CARE IF:  °· Your chest pain does not go away, even after treatment. °· You have a rash with blisters on your chest. °· You have a fever. °SEEK IMMEDIATE MEDICAL CARE IF:  °· You have increased chest pain or pain that spreads to your arm, neck, jaw, back, or abdomen. °· You have shortness of breath. °· You have an increasing cough, or you cough   up blood.  You have severe back or abdominal pain.  You feel nauseous or vomit.  You have severe weakness.  You faint.  You have chills. This is an emergency. Do not wait to see if the pain will go away. Get medical help at once. Call your local emergency services (911 in [place] Do not drive  yourself to the hospital. MAKE SURE YOU:   Understand these instructions.  Will watch your condition.  Will get help right away if you are not doing well or get worse. Document Released: 11/05/2004 Document Revised: 01/31/2013 Document Reviewed: 09/01/2007 [facility] Patient Information 2015 [place], [place]. This information is not intended to replace advice given to you by your health care provider. Make sure you discuss any questions you have with your health care provider.  STOP TAKING YOUR LYRICA. DISCUSS THIS WITH YOUR NEUROLOGIST  CONTACT YOUR FAMILY DOCTOR TOMORROW  TO DISCUSS ARRANGING A CARDIAC STRESS TEST.  START PRILOSEC AS PRESCRIBED.

## 2013-12-11 ENCOUNTER — Ambulatory Visit: Payer: BC Managed Care – PPO | Admitting: Physical Medicine & Rehabilitation

## 2013-12-12 ENCOUNTER — Ambulatory Visit (HOSPITAL_BASED_OUTPATIENT_CLINIC_OR_DEPARTMENT_OTHER): Payer: BC Managed Care – PPO | Admitting: Physical Medicine & Rehabilitation

## 2013-12-12 ENCOUNTER — Encounter: Payer: Self-pay | Admitting: Physical Medicine & Rehabilitation

## 2013-12-12 ENCOUNTER — Encounter: Payer: BC Managed Care – PPO | Attending: Physical Medicine and Rehabilitation

## 2013-12-12 VITALS — BP 124/79 | HR 72

## 2013-12-12 DIAGNOSIS — F419 Anxiety disorder, unspecified: Secondary | ICD-10-CM | POA: Insufficient documentation

## 2013-12-12 DIAGNOSIS — IMO0002 Reserved for concepts with insufficient information to code with codable children: Secondary | ICD-10-CM

## 2013-12-12 DIAGNOSIS — M47817 Spondylosis without myelopathy or radiculopathy, lumbosacral region: Secondary | ICD-10-CM | POA: Diagnosis present

## 2013-12-12 DIAGNOSIS — G8929 Other chronic pain: Secondary | ICD-10-CM

## 2013-12-12 DIAGNOSIS — M5136 Other intervertebral disc degeneration, lumbar region: Secondary | ICD-10-CM

## 2013-12-12 DIAGNOSIS — M51369 Other intervertebral disc degeneration, lumbar region without mention of lumbar back pain or lower extremity pain: Secondary | ICD-10-CM

## 2013-12-12 DIAGNOSIS — M5481 Occipital neuralgia: Secondary | ICD-10-CM | POA: Diagnosis not present

## 2013-12-12 DIAGNOSIS — G43709 Chronic migraine without aura, not intractable, without status migrainosus: Secondary | ICD-10-CM

## 2013-12-12 MED ORDER — OXYCODONE HCL ER 10 MG PO T12A: 10.0000 mg | EXTENDED_RELEASE_TABLET | Freq: Two times a day (BID) | ORAL | Status: DC

## 2013-12-12 MED ORDER — HYDROCODONE-ACETAMINOPHEN 5-325 MG PO TABS: 1.0000 | ORAL_TABLET | Freq: Every day | ORAL | Status: DC

## 2013-12-12 NOTE — Progress Notes (Signed)
Subjective:    Patient ID: Cassandra Gallagher, female    DOB: 04/10/1960, 53 y.o.   MRN: [id number]  HPI Stopped topamax secondary to depression Lyrica stopped related to CP, EKG in ED was OK, stress test recommended Couldn't f/u with stress test since daughter had appendectomy  Increased oxycontin CR to BID once Lyrica stopped  Hydrocodone 5mg  TID  States her husband has been in contact with the hospital in [facility] specializes in trigeminal neuralgia as well as occipital neuralgia. We discussed that her pain is more diffuse and is unlikely to respond to any one procedure. Also she does not have any symptoms referral to the trigeminal nerve Patient became tearful during this discussion and stated "I need to have hope" Discussed changes in central nervous system processing of pain as part of chronic pain syndrome  Pain Inventory Average Pain 5 Pain Right Now 4 My pain is intermittent, sharp, burning, stabbing and aching  In the last 24 hours, has pain interfered with the following? General activity 0 Relation with others 0 Enjoyment of life 6 What TIME of day is your pain at its worst? morning,evening, night Sleep (in general) Poor  Pain is worse with: unsure Pain improves with: heat/ice, therapy/exercise, medication and injections Relief from Meds: 6  Mobility how many minutes can you walk? 2 Do you have any goals in this area?  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  yes stopped taking Lyrica  Physicians involved in your care Any changes since last visit?  no   Family History  Problem Relation Age of Onset  . Hypertension Mother    History   Social History  . Marital Status: Significant Other    Spouse Name: N/A    Number of Children: 2  . Years of Education: N/A   Occupational History  . Field seismologist   Social History Main Topics    . Smoking status: Former Research scientist (life sciences)  . Smokeless tobacco: Never Used     Comment: smoked for 6 years, quit 15 years ago  . Alcohol Use: No  . Drug Use: No  . Sexual Activity: None   Other Topics Concern  . None   Social History Narrative   2nd child born recently in September 2010.   Married, one child from previous marriage lives in [place]   Past Surgical History  Procedure Laterality Date  . Breast biopsy    . Cesarean section  10/29/2008    twins  . Dilitation & currettage/hystroscopy with versapoint resection N/A 07/20/2012    Procedure: DILATATION & CURETTAGE/HYSTEROSCOPY WITH VERSAPOINT RESECTION;  Surgeon: Princess Bruins, MD;  Location: [facility];  Service: Gynecology;  Laterality: N/A;  . Hysteroscopy w/d&c  07-20-12   Past Medical History  Diagnosis Date  . DEPRESSIVE DISORDER 07/26/2007  . UTI 05/11/2007  . Lump or mass in breast 11/14/2008  . 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 Cervicalgia 07/26/2007  . BACK PAIN 07/26/2007  . Palpitations 09/21/2008  . Grave's disease     hx of Grave's Disease/Hyperthyroidism  . Hx of migraines   . Anxiety 06/02/2010  . Insomnia 06/02/2010  . 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 Lumbar degenerative disc disease 01/20/2011  . Hyperlipidemia 12/17/2011   BP 124/79 mmHg  Pulse 72  SpO2 99%  Opioid Risk Score:   Fall Risk Score: Low Fall Risk (0-5 points)  Review of Systems     Objective:   Physical Exam  Constitutional: She is oriented to person, place, and time. She appears well-developed and well-nourished.  HENT:  Head: Normocephalic and atraumatic.  Neurological: She is alert and oriented to person, place, and time. She has normal strength and normal reflexes.  Psychiatric: She has a normal mood and affect.  Nursing note and vitals reviewed.  Motor strength is 5/5 bilateral deltoid, biceps, triceps, grip, hip flexion, knee extension, ankle dorsi flexor plantar flexion Lumbar range of motion  normal Cervical range of motion normal Deep tendon reflexes in upper and lower limbs are normal       Assessment & Plan:  1. Lumbar spondylosis doing well, prolonged relief from medial branch block , Feels like this may be wearing off however not ready yet to schedule repeat medial branch block  2. Cervical spondylosis with cervical headaches. Has responded well to cervical epidural's in the past.   3. Headaches question occipital neuralgia vs. migraine vs. combination. Goes to the neurology clinic at [facility]. She reports little relief from the Botox injection.  Will change meds to   Oxycontin CR 10mg  BID Hydrocodone 5mg  per day  Follow-up with nurse practitioner next month. Evaluate medication change. Evaluate need for repeat medial branch block if low back pain increasing

## 2013-12-19 ENCOUNTER — Telehealth: Payer: Self-pay

## 2013-12-19 MED ORDER — RIZATRIPTAN BENZOATE 10 MG PO TABS: 10.0000 mg | ORAL_TABLET | ORAL | Status: DC | PRN

## 2013-12-19 NOTE — Telephone Encounter (Signed)
Done erx 

## 2013-12-19 NOTE — Telephone Encounter (Signed)
Refill request for Maxalt, not on current list.  Send to [facility]

## 2013-12-25 ENCOUNTER — Other Ambulatory Visit: Payer: Self-pay | Admitting: Internal Medicine

## 2013-12-26 NOTE — Telephone Encounter (Signed)
Faxed hardcopy for clonazepam to [facility]

## 2013-12-26 NOTE — Telephone Encounter (Signed)
Done hardcopy to [facility]  

## 2014-01-02 ENCOUNTER — Encounter: Payer: Self-pay | Admitting: Internal Medicine

## 2014-01-02 ENCOUNTER — Ambulatory Visit (INDEPENDENT_AMBULATORY_CARE_PROVIDER_SITE_OTHER): Payer: BC Managed Care – PPO | Admitting: Internal Medicine

## 2014-01-02 VITALS — BP 104/72 | HR 80 | Temp 97.2°F | Ht 66.0 in | Wt 152.1 lb

## 2014-01-02 DIAGNOSIS — R0789 Other chest pain: Secondary | ICD-10-CM

## 2014-01-02 DIAGNOSIS — R079 Chest pain, unspecified: Secondary | ICD-10-CM | POA: Insufficient documentation

## 2014-01-02 DIAGNOSIS — G43001 Migraine without aura, not intractable, with status migrainosus: Secondary | ICD-10-CM

## 2014-01-02 DIAGNOSIS — Z0000 Encounter for general adult medical examination without abnormal findings: Secondary | ICD-10-CM

## 2014-01-02 DIAGNOSIS — G43909 Migraine, unspecified, not intractable, without status migrainosus: Secondary | ICD-10-CM | POA: Insufficient documentation

## 2014-01-02 MED ORDER — ZOLMITRIPTAN 5 MG NA SOLN: 1.0000 | NASAL | Status: DC | PRN

## 2014-01-02 MED ORDER — TADALAFIL 20 MG PO TABS: 20.0000 mg | ORAL_TABLET | Freq: Every day | ORAL | Status: DC | PRN

## 2014-01-02 NOTE — Assessment & Plan Note (Signed)

## 2014-01-02 NOTE — Assessment & Plan Note (Signed)
Recent exertional, for referral stress test
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
## 2014-01-02 NOTE — Patient Instructions (Addendum)
Please take all new medication as prescribed - the zomig nasal spray  You will be contacted regarding the referral for: stress test  Please continue all other medications as before, and refills have been done if requested.  Please have the pharmacy call with any other refills you may need.  Please keep your appointments with your specialists as you may have planned  Please go to the LAB in the Basement (turn left off the elevator) for the tests to be done at your convenience  You will be contacted by phone if any changes need to be made immediately.  Otherwise, you will receive a letter about your results with an explanation, but please check with MyChart first.  Please remember to sign up for MyChart if you have not done so, as this will be important to you in the future with finding out test results, communicating by private email, and scheduling acute appointments online when needed.  Please return in 1 year for your yearly visit, or sooner if needed

## 2014-01-02 NOTE — Progress Notes (Signed)
Pre visit review using our clinic review tool, if applicable. No additional management support is needed unless otherwise documented below in the visit note. 

## 2014-01-02 NOTE — Assessment & Plan Note (Signed)
maxalt no longer covered by ins, to try generic zomig nasal asd,  to f/u any worsening symptoms or concerns

## 2014-01-09 ENCOUNTER — Encounter: Payer: BC Managed Care – PPO | Attending: Physical Medicine and Rehabilitation | Admitting: Registered Nurse

## 2014-01-09 ENCOUNTER — Encounter: Payer: Self-pay | Admitting: Registered Nurse

## 2014-01-09 ENCOUNTER — Telehealth: Payer: Self-pay | Admitting: *Deleted

## 2014-01-09 ENCOUNTER — Other Ambulatory Visit: Payer: Self-pay | Admitting: Physical Medicine & Rehabilitation

## 2014-01-09 VITALS — BP 108/69 | HR 85 | Resp 14 | Ht 66.0 in | Wt 152.0 lb

## 2014-01-09 DIAGNOSIS — G8929 Other chronic pain: Secondary | ICD-10-CM | POA: Insufficient documentation

## 2014-01-09 DIAGNOSIS — M5481 Occipital neuralgia: Secondary | ICD-10-CM | POA: Diagnosis not present

## 2014-01-09 DIAGNOSIS — M5136 Other intervertebral disc degeneration, lumbar region: Secondary | ICD-10-CM | POA: Diagnosis not present

## 2014-01-09 DIAGNOSIS — F419 Anxiety disorder, unspecified: Secondary | ICD-10-CM | POA: Diagnosis not present

## 2014-01-09 DIAGNOSIS — M47817 Spondylosis without myelopathy or radiculopathy, lumbosacral region: Secondary | ICD-10-CM | POA: Diagnosis present

## 2014-01-09 DIAGNOSIS — G43709 Chronic migraine without aura, not intractable, without status migrainosus: Secondary | ICD-10-CM | POA: Insufficient documentation

## 2014-01-09 DIAGNOSIS — Z79899 Other long term (current) drug therapy: Secondary | ICD-10-CM

## 2014-01-09 DIAGNOSIS — Z5181 Encounter for therapeutic drug level monitoring: Secondary | ICD-10-CM

## 2014-01-09 DIAGNOSIS — M47812 Spondylosis without myelopathy or radiculopathy, cervical region: Secondary | ICD-10-CM

## 2014-01-09 MED ORDER — OXYCODONE HCL ER 10 MG PO T12A: 10.0000 mg | EXTENDED_RELEASE_TABLET | Freq: Two times a day (BID) | ORAL | Status: DC

## 2014-01-09 MED ORDER — HYDROCODONE-ACETAMINOPHEN 5-325 MG PO TABS: ORAL_TABLET | ORAL | Status: DC

## 2014-01-09 NOTE — Progress Notes (Signed)
Subjective:    Patient ID: Cassandra Gallagher, female    DOB: [birth year], 53 y.o.   MRN: [id number]  HPI: Cassandra Gallagher is a 53 year old female who returns for follow up for chronic pain and medication refill. She says her pain is located in her neck. She rates her pain 5. Her current exercise regime is walking and performing stretching exercises.  She is short on her medications by 5 days. She says she was having excruciated pain and increased her analgesic. Educated her on the importance of calling the office and not increasing her medications without approval. She verbalizes understanding.  Spoke with Dr. Letta Pate we will increase her hydrocodone to TID as needed. She verbalizes understanding. Husband in room all questions were answered.  Pain Inventory Average Pain 9 Pain Right Now 5 My pain is constant, burning and aching  In the last 24 hours, has pain interfered with the following? General activity 6 Relation with others 6 Enjoyment of life 6 What TIME of day is your pain at its worst? morning evening and night Sleep (in general) Poor  Pain is worse with: inactivity, standing and lying down Pain improves with: heat/ice, therapy/exercise, medication and injections Relief from Meds: 9  Mobility how many minutes can you walk? 60 ability to climb steps?  yes do you drive?  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  no  Physicians involved in your care Any changes since last visit?  no   Family History  Problem Relation Age of Onset  . Hypertension Mother    History   Social History  . Marital Status: Significant Other    Spouse Name: N/A    Number of Children: 2  . 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 Smoking status: Former Research scientist (life sciences)  . Smokeless tobacco: Never Used     Comment: smoked for 6  years, quit 15 years ago  . Alcohol Use: No  . Drug Use: No  . Sexual Activity: None   Other Topics Concern  . None   Social History Narrative   2nd child born recently in September 2010.   Married, one child from previous marriage lives in [place]   Past Surgical History  Procedure Laterality Date  . Breast biopsy    . Cesarean section  10/29/2008    twins  . Dilitation & currettage/hystroscopy with versapoint resection N/A 07/20/2012    Procedure: DILATATION & CURETTAGE/HYSTEROSCOPY WITH VERSAPOINT RESECTION;  Surgeon: Cassandra Bruins, MD;  Location: [facility];  Service: Gynecology;  Laterality: N/A;  . Hysteroscopy w/d&c  07-20-12   Past Medical History  Diagnosis Date  . DEPRESSIVE DISORDER 07/26/2007  . UTI 05/11/2007  . Lump or mass in breast 11/14/2008  . 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 Cervicalgia 07/26/2007  . BACK PAIN 07/26/2007  . Palpitations 09/21/2008  . Grave's disease     hx of Grave's Disease/Hyperthyroidism  . Hx of migraines   . Anxiety 06/02/2010  . Insomnia 06/02/2010  . 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 Lumbar degenerative disc disease 01/20/2011  . Hyperlipidemia 12/17/2011   BP 108/69 mmHg  Pulse 85  Resp 14  Ht 5\' 6"  (1.676 m)  Wt 152 lb (68.947 kg)  BMI 24.55 kg/m2  SpO2 98%  Opioid Risk Score:   Fall  Risk Score:  (previoulsy educated and declined handout)  Review of Systems  Gastrointestinal: Positive for nausea and vomiting.  Musculoskeletal: Positive for neck pain.  Psychiatric/Behavioral: The patient is nervous/anxious.   All other systems reviewed and are negative.      Objective:   Physical Exam  Constitutional: She is oriented to person, place, and time. She appears well-developed and well-nourished.  HENT:  Head: Normocephalic and atraumatic.  Neck: Normal range of motion. Neck supple.  Cardiovascular: Normal rate and regular rhythm.   Pulmonary/Chest: Effort normal and breath sounds normal.    Musculoskeletal:  Normal Muscle Bulk and Muscle Testing reveals: Upper Extremities: Full ROM and Muscle Strength 5/5 Lower Extremities: Full ROM and Muscle strength 5/5 Arises from chair with ease Narrow based Gait  Neurological: She is alert and oriented to person, place, and time.  Skin: Skin is warm and dry.  Psychiatric: She has a normal mood and affect.  Nursing note and vitals reviewed.         Assessment & Plan:  1. Lumbar spondylosis: Had medial branch block in the past. Will continue with Exercise and Heat Regimen at this time. 2. Cervical spondylosis with cervical headaches.  Refilled: Oxycontin 10 mg one tablet every 12 hours #60 and Hydrocodone 5/325 mg one tablet TID as needed #90. 3. Headaches question occipital neuralgia vs. migraine vs. combination. Following the neurology clinic at [facility].   30 minutes of face to face patient care time was spent. All questions were encouraged and answered.  F/U in 1 month

## 2014-01-09 NOTE — Telephone Encounter (Signed)
[facility] Triage Call Report Triage Record Num: [id number] Operator: Marianne Sofia Patient Name: Cassandra Gallagher Call Date & Time: 01/02/2014 6:35:32PM Patient Phone: [phone] PCP: Cathlean Cower Patient Gender: Female PCP Fax : [phone] Patient DOB: May 07, 1960 Practice Name: [facility] Reason for Call: Caller: Roselyn Reef; PCP: Cathlean Cower (Adults only); CB#: [phone]; Call regarding Medication Issue; Medication(s): Cialis script recieved today 01/02/14 on this female patient, pharmacist questioning use and need for script, Epic record checked and no script sent as noted from [facility] record for this medication, advised to call office 01/03/14 when open for further advice on this script, pharmacist agrees, Protocol(s) Used: Medication Questions - Adult Recommended Outcome per Protocol: Provided Health Information Reason for Outcome: Caller has medication question(s) that was answered with available resources Care Advice: ~ 11/

## 2014-01-11 LAB — PMP ALCOHOL METABOLITE (ETG): Ethyl Glucuronide (EtG): NEGATIVE ng/mL

## 2014-01-17 LAB — BENZODIAZEPINES (GC/LC/MS), URINE
Alprazolam metabolite (GC/LC/MS), ur confirm: NEGATIVE ng/mL (ref <25)
Clonazepam metabolite (GC/LC/MS), ur confirm: 42 ng/mL (ref <25)
Flurazepam metabolite (GC/LC/MS), ur confirm: NEGATIVE ng/mL (ref <50)
LORAZEPAMU: NEGATIVE ng/mL (ref <50)
Midazolam (GC/LC/MS), ur confirm: NEGATIVE ng/mL (ref <50)
Nordiazepam (GC/LC/MS), ur confirm: NEGATIVE ng/mL (ref <50)
OXAZEPAMU: NEGATIVE ng/mL (ref <50)
Temazepam (GC/LC/MS), ur confirm: NEGATIVE ng/mL (ref <50)
Triazolam metabolite (GC/LC/MS), ur confirm: NEGATIVE ng/mL (ref <50)

## 2014-01-18 LAB — PRESCRIPTION MONITORING PROFILE (SOLSTAS)
AMPHETAMINE/METH: NEGATIVE ng/mL
BUPRENORPHINE, URINE: NEGATIVE ng/mL
Barbiturate Screen, Urine: NEGATIVE ng/mL
CANNABINOID SCRN UR: NEGATIVE ng/mL
CARISOPRODOL, URINE: NEGATIVE ng/mL
COCAINE METABOLITES: NEGATIVE ng/mL
Creatinine, Urine: 94.16 mg/dL (ref >20.0)
Fentanyl, Ur: NEGATIVE ng/mL
MDMA URINE: NEGATIVE ng/mL
MEPERIDINE UR: NEGATIVE ng/mL
Methadone Screen, Urine: NEGATIVE ng/mL
NITRITES URINE, INITIAL: NEGATIVE ug/mL
Opiate Screen, Urine: NEGATIVE ng/mL
Oxycodone Screen, Ur: NEGATIVE ng/mL
PH URINE, INITIAL: 6.7 pH (ref 4.5–8.9)
Propoxyphene: NEGATIVE ng/mL
TAPENTADOLUR: NEGATIVE ng/mL
TRAMADOL UR: NEGATIVE ng/mL
ZOLPIDEM, URINE: NEGATIVE ng/mL

## 2014-01-24 NOTE — Progress Notes (Signed)
Urine drug screen for this encounter is consistent 

## 2014-01-25 ENCOUNTER — Encounter (HOSPITAL_COMMUNITY): Payer: BC Managed Care – PPO

## 2014-01-31 ENCOUNTER — Other Ambulatory Visit (INDEPENDENT_AMBULATORY_CARE_PROVIDER_SITE_OTHER): Payer: BC Managed Care – PPO

## 2014-01-31 DIAGNOSIS — Z0000 Encounter for general adult medical examination without abnormal findings: Secondary | ICD-10-CM

## 2014-01-31 LAB — HEPATIC FUNCTION PANEL
ALT: 20 U/L (ref 0–35)
AST: 23 U/L (ref 0–37)
Albumin: 4.2 g/dL (ref 3.5–5.2)
Alkaline Phosphatase: 54 U/L (ref 39–117)
Bilirubin, Direct: 0.1 mg/dL (ref 0.0–0.3)
TOTAL PROTEIN: 7.4 g/dL (ref 6.0–8.3)
Total Bilirubin: 0.3 mg/dL (ref 0.2–1.2)

## 2014-01-31 LAB — CBC WITH DIFFERENTIAL/PLATELET
BASOS PCT: 0.3 % (ref 0.0–3.0)
Basophils Absolute: 0 10*3/uL (ref 0.0–0.1)
EOS PCT: 5.9 % — AB (ref 0.0–5.0)
Eosinophils Absolute: 0.3 10*3/uL (ref 0.0–0.7)
HCT: 43 % (ref 36.0–46.0)
Hemoglobin: 14.4 g/dL (ref 12.0–15.0)
LYMPHS ABS: 1.2 10*3/uL (ref 0.7–4.0)
Lymphocytes Relative: 22.9 % (ref 12.0–46.0)
MCHC: 33.5 g/dL (ref 30.0–36.0)
MCV: 93.6 fl (ref 78.0–100.0)
MONO ABS: 0.4 10*3/uL (ref 0.1–1.0)
Monocytes Relative: 7.1 % (ref 3.0–12.0)
NEUTROS PCT: 63.8 % (ref 43.0–77.0)
Neutro Abs: 3.4 10*3/uL (ref 1.4–7.7)
PLATELETS: 301 10*3/uL (ref 150.0–400.0)
RBC: 4.59 Mil/uL (ref 3.87–5.11)
RDW: 12.9 % (ref 11.5–15.5)
WBC: 5.4 10*3/uL (ref 4.0–10.5)

## 2014-01-31 LAB — BASIC METABOLIC PANEL
BUN: 12 mg/dL (ref 6–23)
CALCIUM: 9.7 mg/dL (ref 8.4–10.5)
CO2: 26 mEq/L (ref 19–32)
CREATININE: 0.8 mg/dL (ref 0.4–1.2)
Chloride: 109 mEq/L (ref 96–112)
GFR: 85.31 mL/min (ref >60.00)
Glucose, Bld: 72 mg/dL (ref 70–99)
Potassium: 4.3 mEq/L (ref 3.5–5.1)
SODIUM: 141 meq/L (ref 135–145)

## 2014-01-31 LAB — LIPID PANEL
Cholesterol: 221 mg/dL — ABNORMAL HIGH (ref 0–200)
HDL: 38.9 mg/dL — AB (ref >39.00)
LDL CALC: 158 mg/dL — AB (ref 0–99)
NonHDL: 182.1
TRIGLYCERIDES: 123 mg/dL (ref 0.0–149.0)
Total CHOL/HDL Ratio: 6
VLDL: 24.6 mg/dL (ref 0.0–40.0)

## 2014-01-31 LAB — TSH: TSH: 1.33 u[IU]/mL (ref 0.35–4.50)

## 2014-02-04 ENCOUNTER — Encounter: Payer: Self-pay | Admitting: Registered Nurse

## 2014-02-05 ENCOUNTER — Ambulatory Visit (HOSPITAL_COMMUNITY): Payer: BC Managed Care – PPO | Attending: Cardiology | Admitting: Radiology

## 2014-02-05 DIAGNOSIS — R079 Chest pain, unspecified: Secondary | ICD-10-CM | POA: Diagnosis present

## 2014-02-05 DIAGNOSIS — R0789 Other chest pain: Secondary | ICD-10-CM

## 2014-02-05 MED ORDER — TECHNETIUM TC 99M SESTAMIBI GENERIC - CARDIOLITE
10.0000 | Freq: Once | INTRAVENOUS | Status: AC | PRN
  Administered 2014-02-05: 10 via INTRAVENOUS

## 2014-02-05 MED ORDER — TECHNETIUM TC 99M SESTAMIBI GENERIC - CARDIOLITE
30.0000 | Freq: Once | INTRAVENOUS | Status: AC | PRN
  Administered 2014-02-05: 30 via INTRAVENOUS

## 2014-02-05 NOTE — Progress Notes (Signed)
[facility] SITE [address] NUCLEAR MED [address] [place], [place] [address] [phone]    Cardiology Nuclear Med Study  Cassandra Gallagher is a 52 y.o. female     MRN : [id number]     DOB: 07/15/1960  Procedure Date: 02/05/2014  Nuclear Med Background Indication for Stress Test:  Evaluation for Ischemia History:  No known CAD Cardiac Risk Factors: none  Symptoms:  Chest Pain with Exertion   Nuclear Pre-Procedure Caffeine/Decaff Intake:  6:00am 4oz coffee NPO After: 6:00am Toast  Lungs:  clear O2 Sat: 98% on room air. IV 0.9% NS with Angio Cath:  22g  IV Site: R Wrist  IV Started by:  Matilde Haymaker, RN  Chest Size (in):  36 Cup Size: C  Height: 5\' 6"  (1.676 m)  Weight:  152 lb (68.947 kg)  BMI:  Body mass index is 24.55 kg/(m^2). Tech Comments:  n/a    Nuclear Med Study 1 or 2 day study: 1 day  Stress Test Type:  Stress  Reading MD: n/a  Order Authorizing Provider:  Waynard Edwards  Resting Radionuclide: Technetium 33m Sestamibi  Resting Radionuclide Dose: 11.0 mCi   Stress Radionuclide:  Technetium 23m Sestamibi  Stress Radionuclide Dose: 33.0 mCi           Stress Protocol Rest HR: 93 Stress HR: 160  Rest BP: 134/82 Stress BP: 145/66  Exercise Time (min): 6:00 METS: 7.0   Predicted Max HR: 166 bpm % Max HR: 96.39 bpm Rate Pressure Product: 23200   Dose of Adenosine (mg):  n/a Dose of Lexiscan: n/a mg  Dose of Atropine (mg): n/a Dose of Dobutamine: n/a mcg/kg/min (at max HR)  Stress Test Technologist: Glade Lloyd, BS-ES  Nuclear Technologist:  Earl Many, CNMT     Rest Procedure:  Myocardial perfusion imaging was performed at rest 45 minutes following the intravenous administration of Technetium 7m Sestamibi. Rest ECG: NSR - Normal EKG  Stress Procedure:  The patient exercised on the treadmill utilizing the Bruce Protocol for 6:00 minutes. The patient stopped due to fatigue and denied any chest pain.  Technetium 25m Sestamibi was injected at peak  exercise and myocardial perfusion imaging was performed after a brief delay. Stress ECG: No significant change from baseline ECG  QPS Raw Data Images:  Normal; no motion artifact; normal heart/lung ratio. Stress Images:  Normal homogeneous uptake in all areas of the myocardium. Rest Images:  Normal homogeneous uptake in all areas of the myocardium. Subtraction (SDS):  No evidence of ischemia. Transient Ischemic Dilatation (Normal <1.22):  0.97 Lung/Heart Ratio (Normal <0.45):  0.31  Quantitative Gated Spect Images QGS EDV:  71 ml QGS ESV:  28 ml  Impression Exercise Capacity:  Good exercise capacity. BP Response:  Normal blood pressure response. Clinical Symptoms:  No significant symptoms noted. ECG Impression:  No significant ST segment change suggestive of ischemia. Comparison with Prior Nuclear Study: No previous nuclear study performed  Overall Impression:  Normal stress nuclear study.  LV Ejection Fraction: 61%.  LV Wall Motion:  NL LV Function; NL Wall Motion.   Thayer Headings, Brooke Bonito., MD, [facility] 02/05/2014, 5:32 PM [address],  [place] Pager [phone]

## 2014-02-06 ENCOUNTER — Encounter (HOSPITAL_BASED_OUTPATIENT_CLINIC_OR_DEPARTMENT_OTHER): Payer: BC Managed Care – PPO | Admitting: Registered Nurse

## 2014-02-06 ENCOUNTER — Other Ambulatory Visit: Payer: Self-pay | Admitting: Internal Medicine

## 2014-02-06 ENCOUNTER — Encounter: Payer: Self-pay | Admitting: Registered Nurse

## 2014-02-06 VITALS — BP 144/86 | HR 102 | Resp 14

## 2014-02-06 DIAGNOSIS — Z5181 Encounter for therapeutic drug level monitoring: Secondary | ICD-10-CM

## 2014-02-06 DIAGNOSIS — Z79899 Other long term (current) drug therapy: Secondary | ICD-10-CM

## 2014-02-06 DIAGNOSIS — M47817 Spondylosis without myelopathy or radiculopathy, lumbosacral region: Secondary | ICD-10-CM | POA: Diagnosis not present

## 2014-02-06 DIAGNOSIS — M47812 Spondylosis without myelopathy or radiculopathy, cervical region: Secondary | ICD-10-CM

## 2014-02-06 DIAGNOSIS — F419 Anxiety disorder, unspecified: Secondary | ICD-10-CM

## 2014-02-06 MED ORDER — OXYCODONE HCL ER 10 MG PO T12A: 10.0000 mg | EXTENDED_RELEASE_TABLET | Freq: Two times a day (BID) | ORAL | Status: DC

## 2014-02-06 MED ORDER — HYDROCODONE-ACETAMINOPHEN 5-325 MG PO TABS: ORAL_TABLET | ORAL | Status: DC

## 2014-02-06 NOTE — Telephone Encounter (Signed)
If you could drop it by the office it would be a great deal of help with the authorization, and list on there the medications you have taken that have not worked.  We have to answers those questions for the insurance company.  Thanks, Technical sales engineer

## 2014-02-06 NOTE — Progress Notes (Signed)
Subjective:    Patient ID: Cassandra Gallagher, female    DOB: 09/29/1960, 53 y.o.   MRN: [id number]  HPI: [name] is a 53 year old female who returns for follow up for chronic pain and medication refill. She says her pain is located in her neck. She rates her pain 5. Her current exercise regime is walking and performing stretching exercises.  She is short on her hydrocodone medications by 3 days. The pharmacist wouldn't fill her oxycodone for three days she only had the hydrocodone. Will allow her to get hydrocodone early this month only.  She verbalizes understanding.  Arrived to office tachycardic vitals re-checked BP 124/87 HR 86.  Pain Inventory Average Pain 8 Pain Right Now 5 My pain is intermittent, sharp, burning and aching  In the last 24 hours, has pain interfered with the following? General activity 7 Relation with others 7 Enjoyment of life 7 What TIME of day is your pain at its worst? morning, evening and night Sleep (in general) Poor  Pain is worse with: inactivity Pain improves with: heat/ice and medication Relief from Meds: 5  Mobility walk without assistance  Function employed # of hrs/week 30  Neuro/Psych No problems in this area  Prior Studies Any changes since last visit?  no  Physicians involved in your care Any changes since last visit?  no   Family History  Problem Relation Age of Onset  . Hypertension Mother    History   Social History  . Marital Status: Significant Other    Spouse Name: N/A    Number of Children: 2  . 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 Smoking status: Former Research scientist (life sciences)  . Smokeless tobacco: Never Used     Comment: smoked for 6 years, quit 15 years ago  . Alcohol Use: No  . Drug Use: No  . Sexual Activity: None   Other Topics Concern  . None   Social History Narrative   2nd child born recently in September 2010.   Married, one  child from previous marriage lives in [place]   Past Surgical History  Procedure Laterality Date  . Breast biopsy    . Cesarean section  10/29/2008    twins  . Dilitation & currettage/hystroscopy with versapoint resection N/A 07/20/2012    Procedure: DILATATION & CURETTAGE/HYSTEROSCOPY WITH VERSAPOINT RESECTION;  Surgeon: Princess Bruins, MD;  Location: [facility];  Service: Gynecology;  Laterality: N/A;  . Hysteroscopy w/d&c  07-20-12   Past Medical History  Diagnosis Date  . DEPRESSIVE DISORDER 07/26/2007  . UTI 05/11/2007  . Lump or mass in breast 11/14/2008  . 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 Cervicalgia 07/26/2007  . BACK PAIN 07/26/2007  . Palpitations 09/21/2008  . Grave's disease     hx of Grave's Disease/Hyperthyroidism  . Hx of migraines   . Anxiety 06/02/2010  . Insomnia 06/02/2010  . 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 Lumbar degenerative disc disease 01/20/2011  . Hyperlipidemia 12/17/2011   BP 144/86 mmHg  Pulse 102  Resp 14  SpO2 98%  Opioid Risk Score:   Fall Risk Score: Low Fall Risk (0-5 points)  Review of Systems  Constitutional: Negative.   Eyes: Negative.   Respiratory: Negative.   Cardiovascular: Negative.   Gastrointestinal: Negative.   Endocrine: Negative.   Genitourinary: Negative.   Musculoskeletal: Negative.   Skin: Negative.   Allergic/Immunologic: Negative.  Neurological: Positive for headaches.  Hematological: Negative.   Psychiatric/Behavioral: Negative.        Objective:   Physical Exam  Constitutional: She is oriented to person, place, and time. She appears well-developed and well-nourished.  HENT:  Head: Normocephalic and atraumatic.  Neck: Normal range of motion. Neck supple.  Cervical Paraspinal tenderness: C-5- C-7  Cardiovascular: Normal rate and regular rhythm.   Pulmonary/Chest: Effort normal and breath sounds normal.  Musculoskeletal:  Normal Muscle Bulk and Muscle Testing Reveals: Upper  Extremities: Full ROM and Muscle Strength 5/5 Thoracic Paraspinal Tenderness: T-1- T-3 Lower extremities: Full ROM and Muscle Strength 5/5 Arises from chair with ease Narrow based gait  Neurological: She is alert and oriented to person, place, and time.  Skin: Skin is warm and dry.  Psychiatric: She has a normal mood and affect.  Nursing note and vitals reviewed.         Assessment & Plan:  1. Lumbar spondylosis: Had medial branch block in the past. Will continue with Exercise and Heat Regimen at this time. 2. Cervical spondylosis with cervical headaches.  Refilled: Oxycontin 10 mg one tablet every 12 hours #60 and Hydrocodone 5/325 mg one tablet TID as needed #90. 3. Headaches question occipital neuralgia vs. migraine vs. combination. Following the neurology clinic at [facility].   30 minutes of face to face patient care time was spent. All questions were encouraged and answered.  F/U in 1 month

## 2014-02-06 NOTE — Telephone Encounter (Signed)
Done hardcopy to [facility]  

## 2014-02-07 ENCOUNTER — Encounter: Payer: Self-pay | Admitting: Internal Medicine

## 2014-02-07 ENCOUNTER — Ambulatory Visit (INDEPENDENT_AMBULATORY_CARE_PROVIDER_SITE_OTHER): Payer: BC Managed Care – PPO | Admitting: Internal Medicine

## 2014-02-07 VITALS — BP 102/32 | HR 93 | Temp 98.1°F | Ht 66.0 in | Wt 153.4 lb

## 2014-02-07 DIAGNOSIS — R079 Chest pain, unspecified: Secondary | ICD-10-CM

## 2014-02-07 DIAGNOSIS — M5481 Occipital neuralgia: Secondary | ICD-10-CM

## 2014-02-07 DIAGNOSIS — E785 Hyperlipidemia, unspecified: Secondary | ICD-10-CM

## 2014-02-07 MED ORDER — ATORVASTATIN CALCIUM 10 MG PO TABS: 10.0000 mg | ORAL_TABLET | Freq: Every day | ORAL | Status: DC

## 2014-02-07 MED ORDER — PREGABALIN 50 MG PO CAPS: 50.0000 mg | ORAL_CAPSULE | Freq: Three times a day (TID) | ORAL | Status: DC

## 2014-02-07 NOTE — Progress Notes (Signed)
Pre visit review using our clinic review tool, if applicable. No additional management support is needed unless otherwise documented below in the visit note. 

## 2014-02-07 NOTE — Patient Instructions (Signed)
Please take all new medication as prescribed - the lipitor at 10 mg per day  Please return in 4 weeks for follow up cholesterol level  Please continue all other medications as before, and refills have been done if requested - the lyrica  Please have the pharmacy call with any other refills you may need.  Please keep your appointments with your specialists as you may have planned

## 2014-02-07 NOTE — Telephone Encounter (Signed)
Faxed hardcopy for Clonazepam to [facility]

## 2014-02-07 NOTE — Progress Notes (Signed)
Subjective:    Patient ID: Cassandra Gallagher, female    DOB: 1960/12/11, 53 y.o.   MRN: [id number]  HPI  Here to f/u, no further CP and Pt denies chest pain, increased sob or doe, wheezing, orthopnea, PND, increased LE swelling, palpitations, dizziness or syncope. Recent stress test neg.   Pt denies polydipsia, polyuria, or low sugar symptoms such as weakness or confusion improved with po intake.  Pt states overall good compliance with meds, trying to follow lower cholesterol diet, wt overall stable.  Hard to exercise due to chronic neck pain, asks for lyrica re-start though not happy about need to take prolonged med.   Past Medical History  Diagnosis Date  . DEPRESSIVE DISORDER 07/26/2007  . UTI 05/11/2007  . Lump or mass in breast 11/14/2008  . 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 Cervicalgia 07/26/2007  . BACK PAIN 07/26/2007  . Palpitations 09/21/2008  . Grave's disease     hx of Grave's Disease/Hyperthyroidism  . Hx of migraines   . Anxiety 06/02/2010  . Insomnia 06/02/2010  . 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 Lumbar degenerative disc disease 01/20/2011  . Hyperlipidemia 12/17/2011   Past Surgical History  Procedure Laterality Date  . Breast biopsy    . Cesarean section  10/29/2008    twins  . Dilitation & currettage/hystroscopy with versapoint resection N/A 07/20/2012    Procedure: DILATATION & CURETTAGE/HYSTEROSCOPY WITH VERSAPOINT RESECTION;  Surgeon: Princess Bruins, MD;  Location: [facility];  Service: Gynecology;  Laterality: N/A;  . Hysteroscopy w/d&c  07-20-12    reports that she has quit smoking. She has never used smokeless tobacco. She reports that she does not drink alcohol or use illicit drugs. family history includes Hypertension in her mother. No Known Allergies Current Outpatient Prescriptions on File Prior to Visit  Medication Sig Dispense Refill  . clonazePAM (KLONOPIN) 0.5 MG tablet take 1 tablet by mouth twice a day if needed 60 tablet 3  .  COMBIPATCH 0.05-0.14 MG/DAY Place 1 patch onto the skin Every third day.    . estradiol (ESTRACE VAGINAL) 0.1 MG/GM vaginal cream Place 1 g vaginally.    Marland Kitchen HYDROcodone-acetaminophen (NORCO/VICODIN) 5-325 MG per tablet Take three times a day as needed for pain. No MORE Than 3 a Day 90 tablet 0  . Multiple Vitamin (MULTIVITAMIN WITH MINERALS) TABS Take 1 tablet by mouth daily.    . Naproxen Sodium (NAPRELAN) 375 MG TB24 Take 1 tablet by mouth 2 (two) times daily as needed (for pain).     Marland Kitchen omeprazole (PRILOSEC) 20 MG capsule Take 1 capsule (20 mg total) by mouth daily. 30 capsule 0  . OxyCODONE (OXYCONTIN) 10 mg T12A 12 hr tablet Take 1 tablet (10 mg total) by mouth every 12 (twelve) hours. 60 tablet 0  . Red Yeast Rice Extract (RED YEAST RICE PO) Take 1 tablet by mouth daily.    . rizatriptan (MAXALT) 10 MG tablet Take 1 tablet (10 mg total) by mouth as needed for migraine. May repeat in 2 hours if needed 10 tablet 5  . topiramate (TOPAMAX) 100 MG tablet Take 100 mg by mouth at bedtime.    . verapamil (CALAN) 80 MG tablet Take 80 mg by mouth daily.     Marland Kitchen zolmitriptan (ZOMIG) 5 MG nasal solution Place 1 spray into the nose as needed for migraine. 6 Units 5   No current facility-administered medications on file prior to visit.   Review of Systems  Constitutional: Negative for unusual  diaphoresis or other sweats  HENT: Negative for ringing in ear Eyes: Negative for double vision or worsening visual disturbance.  Respiratory: Negative for choking and stridor.   Gastrointestinal: Negative for vomiting or other signifcant bowel change Genitourinary: Negative for hematuria or decreased urine volume.  Musculoskeletal: Negative for other MSK pain or swelling Skin: Negative for color change and worsening wound.  Neurological: Negative for tremors and numbness other than noted  Psychiatric/Behavioral: Negative for decreased concentration or agitation other than above       Objective:   Physical  Exam BP 102/32 mmHg  Pulse 93  Temp(Src) 98.1 F (36.7 C) (Oral)  Ht 5\' 6"  (1.676 m)  Wt 153 lb 6 oz (69.57 kg)  BMI 24.77 kg/m2  SpO2 98% VS noted, not ill appearing Constitutional: Pt appears well-developed, well-nourished.  HENT: Head: NCAT.  Right Ear: External ear normal.  Left Ear: External ear normal.  Eyes: . Pupils are equal, round, and reactive to light. Conjunctivae and EOM are normal Neck: Normal range of motion. Neck supple. Spine with diffuse achy tender in midline, worse to lower and upper c-spine Cardiovascular: Normal rate and regular rhythm.   Pulmonary/Chest: Effort normal and breath sounds without rales or wheezing.  Neurological: Pt is alert. Not confused , motor grossly intact, cn 2-12 intact Skin: Skin is warm. No rash Psychiatric: Pt behavior is normal. No agitation.     Assessment & Plan:

## 2014-02-08 ENCOUNTER — Telehealth: Payer: Self-pay | Admitting: *Deleted

## 2014-02-08 NOTE — Telephone Encounter (Signed)
I contacted pt and explained that I called [employer] to check and see if there was going to be any coverage lapse heading into 2016 with her Rx for oxycontin 10 mg 12 hr. I informed the pt that the representative for [employer] said there would be no lapse in coverage

## 2014-02-16 NOTE — Assessment & Plan Note (Signed)
Currently pain free, etiology not clear  - ? MSK, stress test neg, ok to follow

## 2014-02-16 NOTE — Assessment & Plan Note (Signed)
With persistent symtpoms, neuro exam no change, for lyrica re-start as this helped in past,  to f/u any worsening symptoms or concerns

## 2014-02-16 NOTE — Assessment & Plan Note (Addendum)
D/w pt, for lower chol diet, start lipitor 10 qd, f/u lab in 4 wks

## 2014-03-06 ENCOUNTER — Telehealth: Payer: Self-pay | Admitting: *Deleted

## 2014-03-06 MED ORDER — HYDROCODONE-ACETAMINOPHEN 5-325 MG PO TABS: ORAL_TABLET | ORAL | Status: DC

## 2014-03-06 NOTE — Telephone Encounter (Signed)
Cassandra Gallagher. Called her Hydrocodone will be out in the morning. I reviewed the [place] Controlled Substance reporting system this has been verified. She will come in today to pick up her hydrocodone script. She will keep her scheduled appointment on 03/09/14. She verbalizes understanding.

## 2014-03-06 NOTE — Telephone Encounter (Signed)
Cassandra Gallagher called, says she was told by you that if she has any questions for you that she could call you and you would call her back at the end of the day. She has a question and would like you to call....Marland Kitchenno specifics given

## 2014-03-08 ENCOUNTER — Telehealth: Payer: Self-pay | Admitting: *Deleted

## 2014-03-08 NOTE — Telephone Encounter (Signed)
recvd confirmation that patients Oxycontin has been proved from 02/05/14 through 09/03/14, case ID# [id number]

## 2014-03-09 ENCOUNTER — Encounter: Payer: BC Managed Care – PPO | Attending: Physical Medicine and Rehabilitation | Admitting: Registered Nurse

## 2014-03-09 ENCOUNTER — Encounter: Payer: Self-pay | Admitting: Registered Nurse

## 2014-03-09 ENCOUNTER — Other Ambulatory Visit: Payer: Self-pay | Admitting: Physical Medicine & Rehabilitation

## 2014-03-09 VITALS — BP 110/82 | HR 86 | Resp 14

## 2014-03-09 DIAGNOSIS — G8929 Other chronic pain: Secondary | ICD-10-CM | POA: Insufficient documentation

## 2014-03-09 DIAGNOSIS — F419 Anxiety disorder, unspecified: Secondary | ICD-10-CM | POA: Diagnosis not present

## 2014-03-09 DIAGNOSIS — M5481 Occipital neuralgia: Secondary | ICD-10-CM | POA: Diagnosis not present

## 2014-03-09 DIAGNOSIS — M5136 Other intervertebral disc degeneration, lumbar region: Secondary | ICD-10-CM | POA: Insufficient documentation

## 2014-03-09 DIAGNOSIS — M47817 Spondylosis without myelopathy or radiculopathy, lumbosacral region: Secondary | ICD-10-CM

## 2014-03-09 DIAGNOSIS — M47812 Spondylosis without myelopathy or radiculopathy, cervical region: Secondary | ICD-10-CM

## 2014-03-09 DIAGNOSIS — G43709 Chronic migraine without aura, not intractable, without status migrainosus: Secondary | ICD-10-CM | POA: Insufficient documentation

## 2014-03-09 DIAGNOSIS — Z79899 Other long term (current) drug therapy: Secondary | ICD-10-CM

## 2014-03-09 DIAGNOSIS — Z5181 Encounter for therapeutic drug level monitoring: Secondary | ICD-10-CM

## 2014-03-09 MED ORDER — OXYCODONE HCL ER 10 MG PO T12A: 10.0000 mg | EXTENDED_RELEASE_TABLET | Freq: Two times a day (BID) | ORAL | Status: DC

## 2014-03-09 NOTE — Progress Notes (Signed)
Subjective:    Patient ID: Cassandra Gallagher, female    DOB: 03-04-1960, 54 y.o.   MRN: [id number]  HPI: Cassandra Gallagher is a 54 year old female who returns for follow up for chronic pain and medication refill. She says her pain is located in her neck. She rates her pain 5. Her current exercise regime is walking and performing stretching exercises and riding her bicycle to work three times a week.   Pain Inventory Average Pain 7 Pain Right Now 5 My pain is sharp, burning, dull, stabbing and tingling  In the last 24 hours, has pain interfered with the following? General activity 7 Relation with others 7 Enjoyment of life 8 What TIME of day is your pain at its worst? morning, daytime, night Sleep (in general) Poor  Pain is worse with: inactivity and lying down Pain improves with: heat/ice, medication and injections Relief from Meds: 6  Mobility ability to climb steps?  yes do you drive?  yes Do you have any goals in this area?  yes  Function employed # of hrs/week . 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  no  Physicians involved in your care Any changes since last visit?  no   Family History  Problem Relation Age of Onset  . Hypertension Mother    History   Social History  . Marital Status: Significant Other    Spouse Name: N/A    Number of Children: 2  . 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 Smoking status: Former Research scientist (life sciences)  . Smokeless tobacco: Never Used     Comment: smoked for 6 years, quit 15 years ago  . Alcohol Use: No  . Drug Use: No  . Sexual Activity: None   Other Topics Concern  . None   Social History Narrative   2nd child born recently in September 2010.   Married, one child from previous marriage lives in [place]   Past Surgical History  Procedure Laterality Date  . Breast biopsy    . Cesarean section   10/29/2008    twins  . Dilitation & currettage/hystroscopy with versapoint resection N/A 07/20/2012    Procedure: DILATATION & CURETTAGE/HYSTEROSCOPY WITH VERSAPOINT RESECTION;  Surgeon: Princess Bruins, MD;  Location: [facility];  Service: Gynecology;  Laterality: N/A;  . Hysteroscopy w/d&c  07-20-12   Past Medical History  Diagnosis Date  . DEPRESSIVE DISORDER 07/26/2007  . UTI 05/11/2007  . Lump or mass in breast 11/14/2008  . 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 Cervicalgia 07/26/2007  . BACK PAIN 07/26/2007  . Palpitations 09/21/2008  . Grave's disease     hx of Grave's Disease/Hyperthyroidism  . Hx of migraines   . Anxiety 06/02/2010  . Insomnia 06/02/2010  . 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 Lumbar degenerative disc disease 01/20/2011  . Hyperlipidemia 12/17/2011   BP 110/82 mmHg  Pulse 86  Resp 14  SpO2 98%  Opioid Risk Score:   Fall Risk Score: Low Fall Risk (0-5 points)  Review of Systems  All other systems reviewed and are negative.      Objective:   Physical Exam  Constitutional: She is oriented to person, place, and time. She appears well-developed and well-nourished.  HENT:  Head: Normocephalic and atraumatic.  Neck: Normal range of motion. Neck supple.  Cardiovascular: Normal rate and regular rhythm.  Pulmonary/Chest: Effort normal and breath sounds normal.  Musculoskeletal:  Normal Muscle Bulk and Muscle Testing Reveals: Upper Extremities: Full ROM and Muscle strength 5/5 Lower Extremities: Full ROM and Muscle Strength 5/5 Arises from chair with ease Narrow Based gait  Neurological: She is alert and oriented to person, place, and time.  Skin: Skin is warm and dry.  Psychiatric: She has a normal mood and affect.  Nursing note and vitals reviewed.         Assessment & Plan:  1. Lumbar spondylosis: Had medial branch block in the past. Will continue with Exercise and Heat Regimen at this time. 2. Cervical spondylosis with  cervical headaches.  Refilled: Oxycontin 10 mg one tablet every 12 hours #60 and Hydrocodone 5/325 mg prescription was picked up already. 3. Headaches question occipital neuralgia vs. migraine vs. combination. Following the neurology clinic at [facility].  Her appointment  March/2016  30 minutes of face to face patient care time was spent. All questions were encouraged and answered.  F/U in 1 month

## 2014-03-10 LAB — PMP ALCOHOL METABOLITE (ETG): Ethyl Glucuronide (EtG): NEGATIVE ng/mL

## 2014-03-15 LAB — OPIATES/OPIOIDS (LC/MS-MS)
Codeine Urine: NEGATIVE ng/mL (ref <50)
Hydrocodone: 55 ng/mL (ref <50)
Hydromorphone: 86 ng/mL (ref <50)
Morphine Urine: NEGATIVE ng/mL (ref <50)
Norhydrocodone, Ur: 272 ng/mL (ref <50)
Noroxycodone, Ur: NEGATIVE ng/mL — AB (ref <50)
Oxycodone, ur: NEGATIVE ng/mL — AB (ref <50)
Oxymorphone: NEGATIVE ng/mL — AB (ref <50)

## 2014-03-15 LAB — BENZODIAZEPINES (GC/LC/MS), URINE
Alprazolam metabolite (GC/LC/MS), ur confirm: NEGATIVE ng/mL (ref <25)
Clonazepam metabolite (GC/LC/MS), ur confirm: 149 ng/mL (ref <25)
FLURAZEPAMU: NEGATIVE ng/mL (ref <50)
LORAZEPAMU: NEGATIVE ng/mL (ref <50)
Midazolam (GC/LC/MS), ur confirm: NEGATIVE ng/mL (ref <50)
Nordiazepam (GC/LC/MS), ur confirm: NEGATIVE ng/mL (ref <50)
OXAZEPAMU: NEGATIVE ng/mL (ref <50)
Temazepam (GC/LC/MS), ur confirm: NEGATIVE ng/mL (ref <50)
Triazolam metabolite (GC/LC/MS), ur confirm: NEGATIVE ng/mL (ref <50)

## 2014-03-16 LAB — PRESCRIPTION MONITORING PROFILE (SOLSTAS)
AMPHETAMINE/METH: NEGATIVE ng/mL
BARBITURATE SCREEN, URINE: NEGATIVE ng/mL
BUPRENORPHINE, URINE: NEGATIVE ng/mL
Cannabinoid Scrn, Ur: NEGATIVE ng/mL
Carisoprodol, Urine: NEGATIVE ng/mL
Cocaine Metabolites: NEGATIVE ng/mL
Creatinine, Urine: 113.12 mg/dL (ref >20.0)
Fentanyl, Ur: NEGATIVE ng/mL
MDMA URINE: NEGATIVE ng/mL
Meperidine, Ur: NEGATIVE ng/mL
Methadone Screen, Urine: NEGATIVE ng/mL
NITRITES URINE, INITIAL: NEGATIVE ug/mL
Oxycodone Screen, Ur: NEGATIVE ng/mL
PROPOXYPHENE: NEGATIVE ng/mL
Tapentadol, urine: NEGATIVE ng/mL
Tramadol Scrn, Ur: NEGATIVE ng/mL
Zolpidem, Urine: NEGATIVE ng/mL
pH, Initial: 5.1 pH (ref 4.5–8.9)

## 2014-03-20 ENCOUNTER — Ambulatory Visit (INDEPENDENT_AMBULATORY_CARE_PROVIDER_SITE_OTHER): Payer: BC Managed Care – PPO | Admitting: Internal Medicine

## 2014-03-20 ENCOUNTER — Encounter: Payer: Self-pay | Admitting: Internal Medicine

## 2014-03-20 VITALS — BP 102/76 | HR 81 | Temp 97.5°F | Ht 66.0 in | Wt 156.0 lb

## 2014-03-20 DIAGNOSIS — J029 Acute pharyngitis, unspecified: Secondary | ICD-10-CM

## 2014-03-20 MED ORDER — AMOXICILLIN 500 MG PO CAPS: 1000.0000 mg | ORAL_CAPSULE | Freq: Two times a day (BID) | ORAL | Status: DC

## 2014-03-20 NOTE — Progress Notes (Signed)
Pre visit review using our clinic review tool, if applicable. No additional management support is needed unless otherwise documented below in the visit note. 

## 2014-03-20 NOTE — Progress Notes (Addendum)
Subjective:    Patient ID: Cassandra Gallagher, female    DOB: 1960-10-04, 54 y.o.   MRN: [id number]  HPI  Here with 2-3 days acute onset fever, facial pain, pressure, headache, general weakness and malaise, and greenish d/c, with mild ST and cough, but pt denies chest pain, wheezing, increased sob or doe, orthopnea, PND, increased LE swelling, palpitations, dizziness or syncope. Past Medical History  Diagnosis Date  . DEPRESSIVE DISORDER 07/26/2007  . UTI 05/11/2007  . Lump or mass in breast 11/14/2008  . 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 Cervicalgia 07/26/2007  . BACK PAIN 07/26/2007  . Palpitations 09/21/2008  . Grave's disease     hx of Grave's Disease/Hyperthyroidism  . Hx of migraines   . Anxiety 06/02/2010  . Insomnia 06/02/2010  . 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 Lumbar degenerative disc disease 01/20/2011  . Hyperlipidemia 12/17/2011   Past Surgical History  Procedure Laterality Date  . Breast biopsy    . Cesarean section  10/29/2008    twins  . Dilitation & currettage/hystroscopy with versapoint resection N/A 07/20/2012    Procedure: DILATATION & CURETTAGE/HYSTEROSCOPY WITH VERSAPOINT RESECTION;  Surgeon: Princess Bruins, MD;  Location: [facility];  Service: Gynecology;  Laterality: N/A;  . Hysteroscopy w/d&c  07-20-12    reports that she has quit smoking. She has never used smokeless tobacco. She reports that she does not drink alcohol or use illicit drugs. family history includes Hypertension in her mother. No Known Allergies Current Outpatient Prescriptions on File Prior to Visit  Medication Sig Dispense Refill  . atorvastatin (LIPITOR) 10 MG tablet Take 1 tablet (10 mg total) by mouth daily. 90 tablet 3  . clonazePAM (KLONOPIN) 0.5 MG tablet take 1 tablet by mouth twice a day if needed 60 tablet 3  . COMBIPATCH 0.05-0.14 MG/DAY Place 1 patch onto the skin Every third day.    . estradiol (ESTRACE VAGINAL) 0.1 MG/GM vaginal cream Place 1 g vaginally.     Marland Kitchen HYDROcodone-acetaminophen (NORCO/VICODIN) 5-325 MG per tablet Take three times a day as needed for pain. No MORE Than 3 a Day 90 tablet 0  . Multiple Vitamin (MULTIVITAMIN WITH MINERALS) TABS Take 1 tablet by mouth daily.    . Naproxen Sodium (NAPRELAN) 375 MG TB24 Take 1 tablet by mouth 2 (two) times daily as needed (for pain).     Marland Kitchen omeprazole (PRILOSEC) 20 MG capsule Take 1 capsule (20 mg total) by mouth daily. 30 capsule 0  . OxyCODONE (OXYCONTIN) 10 mg T12A 12 hr tablet Take 1 tablet (10 mg total) by mouth every 12 (twelve) hours. 60 tablet 0  . pregabalin (LYRICA) 50 MG capsule Take 1 capsule (50 mg total) by mouth 3 (three) times daily. 90 capsule 0  . Red Yeast Rice Extract (RED YEAST RICE PO) Take 1 tablet by mouth daily.    . rizatriptan (MAXALT) 10 MG tablet Take 1 tablet (10 mg total) by mouth as needed for migraine. May repeat in 2 hours if needed 10 tablet 5  . topiramate (TOPAMAX) 100 MG tablet Take 100 mg by mouth at bedtime.    . verapamil (CALAN) 80 MG tablet Take 80 mg by mouth daily.     Marland Kitchen zolmitriptan (ZOMIG) 5 MG nasal solution Place 1 spray into the nose as needed for migraine. 6 Units 5   No current facility-administered medications on file prior to visit.   Review of Systems All otherwise neg per pt     Objective:  Physical Exam BP 102/76 mmHg  Pulse 81  Temp(Src) 97.5 F (36.4 C) (Oral)  Ht 5\' 6"  (1.676 m)  Wt 156 lb (70.761 kg)  BMI 25.19 kg/m2 VS noted, mild ill Constitutional: Pt appears well-developed, well-nourished.  HENT: Head: NCAT.  Right Ear: External ear normal.  Left Ear: External ear normal.  Eyes: . Pupils are equal, round, and reactive to light. Conjunctivae and EOM are normal Bilat tm's with mild erythema.  Max sinus areas mild tender.  Pharynx with mild erythema, no exudate Neck: Normal range of motion. Neck supple.  Cardiovascular: Normal rate and regular rhythm.   Pulmonary/Chest: Effort normal and breath sounds without rales or  wheezing.  Neurological: Pt is alert. Not confused , motor grossly intact Skin: Skin is warm. No rash Psychiatric: Pt behavior is normal. No agitation.     Assessment & Plan:

## 2014-03-20 NOTE — Patient Instructions (Signed)
Please take all new medication as prescribed - the antibiotic  Please continue all other medications as before, and refills have been done if requested.  Please have the pharmacy call with any other refills you may need.  Please keep your appointments with your specialists as you may have planned   

## 2014-03-28 ENCOUNTER — Telehealth: Payer: Self-pay | Admitting: *Deleted

## 2014-03-28 DIAGNOSIS — M47812 Spondylosis without myelopathy or radiculopathy, cervical region: Secondary | ICD-10-CM

## 2014-03-28 DIAGNOSIS — R519 Headache, unspecified: Secondary | ICD-10-CM

## 2014-03-28 DIAGNOSIS — R51 Headache: Secondary | ICD-10-CM

## 2014-03-28 DIAGNOSIS — G8929 Other chronic pain: Secondary | ICD-10-CM

## 2014-03-28 NOTE — Telephone Encounter (Signed)
Jillana has called asking to leave a message for a call back from [facility]. When I asked what the call was in reference to she stated it was test results. Zella Ball, after going through everything with a microscope it is best just to follow Dr Letta Pate recommendation on this UDS to prescribe only the hydrocodone. Then, if her counts are off and UDS results are off then it will be a clear cut discharge. Otherwise, we would be looking at a discharge since past two UDS have been missing oxycontin. The missing oxycontin on this UDS does not make sense since she had pills on the day of the test.

## 2014-03-29 NOTE — Telephone Encounter (Signed)
Spoke to Mrs. Sabey regarding her UDS, no Oxycontin was detected for two months. She says her pain has intensified over the last few months when her Lyrica was discontinued. Her Lyrica was discontinued when she was experiencing chest pain. She had a cardiovascular workup. She has resumed her Lyrica. She says there were times when she couldn't remember if she had taken her analgesics. We discuss a medication log. She verbalizes understanding. Spoke with Dr. Letta Pate we will not prescribe her Oxycontin she verbalizes understanding. We discuss the Plan we can increase her Hydrocodone to 7.5 mg we can provide her list of other Pain clinics. She stated" she would like to stay with Dr. Read Drivers and increase her Hydrocodone".  She is interested in obtaining the referral to [facility] for Occipital Nerve Neuro Lysis. This order will be placed today. She verbalizes understanding.

## 2014-03-30 ENCOUNTER — Telehealth: Payer: Self-pay | Admitting: *Deleted

## 2014-03-30 NOTE — Telephone Encounter (Signed)
Pt would like to speak to [place], that is all that was said

## 2014-04-02 ENCOUNTER — Telehealth: Payer: Self-pay | Admitting: Registered Nurse

## 2014-04-02 NOTE — Telephone Encounter (Signed)
I spoke to Cassandra Gallagher, She was having increase intensity of pain  will allow her to take an extra hydrocodone. She has an appointment tomorrow. She verbalizes understanding.

## 2014-04-03 ENCOUNTER — Encounter: Payer: BC Managed Care – PPO | Attending: Physical Medicine and Rehabilitation | Admitting: Registered Nurse

## 2014-04-03 ENCOUNTER — Encounter: Payer: Self-pay | Admitting: Registered Nurse

## 2014-04-03 VITALS — BP 118/73 | HR 85 | Resp 14

## 2014-04-03 DIAGNOSIS — G8929 Other chronic pain: Secondary | ICD-10-CM | POA: Insufficient documentation

## 2014-04-03 DIAGNOSIS — F419 Anxiety disorder, unspecified: Secondary | ICD-10-CM | POA: Diagnosis not present

## 2014-04-03 DIAGNOSIS — Z5181 Encounter for therapeutic drug level monitoring: Secondary | ICD-10-CM

## 2014-04-03 DIAGNOSIS — M5481 Occipital neuralgia: Secondary | ICD-10-CM | POA: Insufficient documentation

## 2014-04-03 DIAGNOSIS — M5136 Other intervertebral disc degeneration, lumbar region: Secondary | ICD-10-CM | POA: Insufficient documentation

## 2014-04-03 DIAGNOSIS — R51 Headache: Secondary | ICD-10-CM

## 2014-04-03 DIAGNOSIS — M47817 Spondylosis without myelopathy or radiculopathy, lumbosacral region: Secondary | ICD-10-CM | POA: Diagnosis present

## 2014-04-03 DIAGNOSIS — G43709 Chronic migraine without aura, not intractable, without status migrainosus: Secondary | ICD-10-CM | POA: Insufficient documentation

## 2014-04-03 DIAGNOSIS — M47812 Spondylosis without myelopathy or radiculopathy, cervical region: Secondary | ICD-10-CM

## 2014-04-03 DIAGNOSIS — Z79899 Other long term (current) drug therapy: Secondary | ICD-10-CM

## 2014-04-03 MED ORDER — HYDROCODONE-ACETAMINOPHEN 7.5-325 MG PO TABS: 1.0000 | ORAL_TABLET | Freq: Three times a day (TID) | ORAL | Status: DC | PRN

## 2014-04-03 NOTE — Progress Notes (Signed)
Subjective:    Patient ID: Cassandra Gallagher, female    DOB: 01-Jan-1961, 54 y.o.   MRN: [id number]  HPI: Cassandra Gallagher is a 54 year old female who returns for follow up for chronic pain and medication refill. She says her pain is located in her neck. She rates her pain 3. Her current exercise regime is walking and performing stretching exercises and riding her bicycle to work three days a week.  Pain Inventory Average Pain 9 Pain Right Now 3 My pain is sharp, burning, tingling and aching  In the last 24 hours, has pain interfered with the following? General activity 6 Relation with others 6 Enjoyment of life 7 What TIME of day is your pain at its worst? morning and evening night Sleep (in general) Poor  Pain is worse with: sleeping Pain improves with: heat/ice, therapy/exercise, medication and injections Relief from Meds: 6  Mobility do you drive?  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  no  Physicians involved in your care Any changes since last visit?  no   Family History  Problem Relation Age of Onset  . Hypertension Mother    History   Social History  . Marital Status: Significant Other    Spouse Name: N/A  . Number of Children: 2  . 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 Smoking status: Former Research scientist (life sciences)  . Smokeless tobacco: Never Used     Comment: smoked for 6 years, quit 15 years ago  . Alcohol Use: No  . Drug Use: No  . Sexual Activity: Not on file   Other Topics Concern  . None   Social History Narrative   2nd child born recently in September 2010.   Married, one child from previous marriage lives in [place]   Past Surgical History  Procedure Laterality Date  . Breast biopsy    . Cesarean section  10/29/2008    twins    . Dilitation & currettage/hystroscopy with versapoint resection N/A 07/20/2012    Procedure: DILATATION & CURETTAGE/HYSTEROSCOPY WITH VERSAPOINT RESECTION;  Surgeon: Princess Bruins, MD;  Location: [facility];  Service: Gynecology;  Laterality: N/A;  . Hysteroscopy w/d&c  07-20-12   Past Medical History  Diagnosis Date  . DEPRESSIVE DISORDER 07/26/2007  . UTI 05/11/2007  . Lump or mass in breast 11/14/2008  . 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 Cervicalgia 07/26/2007  . BACK PAIN 07/26/2007  . Palpitations 09/21/2008  . Grave's disease     hx of Grave's Disease/Hyperthyroidism  . Hx of migraines   . Anxiety 06/02/2010  . Insomnia 06/02/2010  . 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 Lumbar degenerative disc disease 01/20/2011  . Hyperlipidemia 12/17/2011   BP 118/73 mmHg  Pulse 85  Resp 14  SpO2 97%  Opioid Risk Score:   Fall Risk Score: Low Fall Risk (0-5 points) (previously educated and given handout)  Review of Systems  Constitutional: Positive for unexpected weight change.       With lyrica  All other systems reviewed and are negative.      Objective:   Physical Exam  Constitutional: She is oriented to person, place, and time. She appears well-developed and well-nourished.  HENT:  Head: Normocephalic and atraumatic.  Neck: Normal range  of motion. Neck supple.  Cardiovascular: Normal rate and regular rhythm.   Pulmonary/Chest: Effort normal and breath sounds normal.  Musculoskeletal:  Normal Muscle Bulk and Muscle Testing Reveals: Upper Extremities: Full ROM and Muscle Strength 5/5 Lower Extremities: Full ROM and Muscle Strength 5/5 Arises from chair with ease Narrow Based Gait  Neurological: She is alert and oriented to person, place, and time.  Skin: Skin is warm and dry.  Psychiatric: She has a normal mood and affect.  Nursing note and vitals reviewed.         Assessment & Plan:  1. Lumbar spondylosis: Had medial branch block in the  past. Will continue with Exercise and Heat Regimen at this time. 2. Cervical spondylosis with cervical headaches.  Discontinued Oxycontin 10 mg one tablet every 12 hours #60 and Increased  Hydrocodone 7.5/325 mg one tablet every 8 hours as needed. Referral was made to [facility] for Occipital Nerve Neuro Lysis. Awaiting a response 3. Headaches question occipital neuralgia vs. migraine vs. combination. Following the neurology clinic at [facility]. Her appointment March/2016  20 minutes of face to face patient care time was spent. All questions were encouraged and answered.  F/U in 1 month

## 2014-04-06 ENCOUNTER — Other Ambulatory Visit: Payer: Self-pay | Admitting: Internal Medicine

## 2014-04-06 NOTE — Progress Notes (Signed)
Urine drug screen for this encounter is inconsistent for prescribed medication.  Negative for oxycontin.

## 2014-05-01 ENCOUNTER — Encounter (HOSPITAL_BASED_OUTPATIENT_CLINIC_OR_DEPARTMENT_OTHER): Payer: BC Managed Care – PPO | Admitting: Registered Nurse

## 2014-05-01 ENCOUNTER — Encounter: Payer: BC Managed Care – PPO | Attending: Physical Medicine and Rehabilitation

## 2014-05-01 ENCOUNTER — Ambulatory Visit (HOSPITAL_BASED_OUTPATIENT_CLINIC_OR_DEPARTMENT_OTHER): Payer: BC Managed Care – PPO | Admitting: Physical Medicine & Rehabilitation

## 2014-05-01 ENCOUNTER — Encounter: Payer: Self-pay | Admitting: Registered Nurse

## 2014-05-01 ENCOUNTER — Encounter: Payer: Self-pay | Admitting: Physical Medicine & Rehabilitation

## 2014-05-01 VITALS — BP 130/70 | HR 78 | Resp 14

## 2014-05-01 DIAGNOSIS — G8929 Other chronic pain: Secondary | ICD-10-CM | POA: Insufficient documentation

## 2014-05-01 DIAGNOSIS — F419 Anxiety disorder, unspecified: Secondary | ICD-10-CM | POA: Diagnosis not present

## 2014-05-01 DIAGNOSIS — M5481 Occipital neuralgia: Secondary | ICD-10-CM | POA: Insufficient documentation

## 2014-05-01 DIAGNOSIS — M47812 Spondylosis without myelopathy or radiculopathy, cervical region: Secondary | ICD-10-CM

## 2014-05-01 DIAGNOSIS — Z5181 Encounter for therapeutic drug level monitoring: Secondary | ICD-10-CM | POA: Diagnosis not present

## 2014-05-01 DIAGNOSIS — R51 Headache: Secondary | ICD-10-CM | POA: Diagnosis not present

## 2014-05-01 DIAGNOSIS — F329 Major depressive disorder, single episode, unspecified: Secondary | ICD-10-CM

## 2014-05-01 DIAGNOSIS — IMO0002 Reserved for concepts with insufficient information to code with codable children: Secondary | ICD-10-CM

## 2014-05-01 DIAGNOSIS — G43709 Chronic migraine without aura, not intractable, without status migrainosus: Secondary | ICD-10-CM

## 2014-05-01 DIAGNOSIS — Z79899 Other long term (current) drug therapy: Secondary | ICD-10-CM

## 2014-05-01 DIAGNOSIS — M47817 Spondylosis without myelopathy or radiculopathy, lumbosacral region: Secondary | ICD-10-CM | POA: Diagnosis present

## 2014-05-01 DIAGNOSIS — M5136 Other intervertebral disc degeneration, lumbar region: Secondary | ICD-10-CM | POA: Diagnosis not present

## 2014-05-01 MED ORDER — HYDROCODONE-ACETAMINOPHEN 7.5-325 MG PO TABS: 1.0000 | ORAL_TABLET | Freq: Three times a day (TID) | ORAL | Status: DC | PRN

## 2014-05-01 NOTE — Patient Instructions (Signed)
1-2 tablet in am for Hydrocodone

## 2014-05-01 NOTE — Progress Notes (Signed)
Subjective:    Patient ID: Cassandra Gallagher, female    DOB: 13-May-1960, 54 y.o.   MRN: [id number]  HPI Lyrica restarted about 1.5 months, started 100mg  three times a day, Helpful for neck pain  Back pain is doing ok.  Sees Gwendel Hanson MD fro Neuro, diagnosed with occipital neuralgia Dr Melton Alar prescribing Lyrica and Topamax   Sleep is better with Lyrica, about 4-5 hours per night Neck pain leads to headaches at night and disrupts her about 4 hours of sleep per night  Discussed results of toxicology no evidence of Oxy Contin, there was evidence of hydrocodone. Patient states that while she was off the Lyrica she was compensating by taking extra OxyContin. We discussed that this is dangerous especially since this is a long acting medication.  Currently her pain is worse in the mornings  Generally takes 1-1/2 tablets of the hydrocodone in the morning She takes her other hydrocodone in the afternoon after work and then at bedtime Pain Inventory Average Pain 6 Pain Right Now 6 My pain is sharp, stabbing and aching  In the last 24 hours, has pain interfered with the following? General activity 7 Relation with others 7 Enjoyment of life 7 What TIME of day is your pain at its worst? morning, night Sleep (in general) Poor  Pain is worse with: walking Pain improves with: heat/ice, medication and injections Relief from Meds: 8  Mobility ability to climb steps?  yes do you drive?  yes Do you have any goals in this area?  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  no  Physicians involved in your care Any changes since last visit?  no   Family History  Problem Relation Age of Onset  . Hypertension Mother    History   Social History  . Marital Status: Significant Other    Spouse Name: N/A  . Number of Children: 2  . Years of Education: N/A   Occupational History  . Building control surveyor   Social History Main Topics  . Smoking status: Former Research scientist (life sciences)  . Smokeless tobacco: Never Used     Comment: smoked for 6 years, quit 15 years ago  . Alcohol Use: No  . Drug Use: No  . Sexual Activity: Not on file   Other Topics Concern  . None   Social History Narrative   2nd child born recently in September 2010.   Married, one child from previous marriage lives in [place]   Past Surgical History  Procedure Laterality Date  . Breast biopsy    . Cesarean section  10/29/2008    twins  . Dilitation & currettage/hystroscopy with versapoint resection N/A 07/20/2012    Procedure: DILATATION & CURETTAGE/HYSTEROSCOPY WITH VERSAPOINT RESECTION;  Surgeon: Princess Bruins, MD;  Location: [facility];  Service: Gynecology;  Laterality: N/A;  . Hysteroscopy w/d&c  07-20-12   Past Medical History  Diagnosis Date  . DEPRESSIVE DISORDER 07/26/2007  . UTI 05/11/2007  . Lump or mass in breast 11/14/2008  . 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 Cervicalgia 07/26/2007  . BACK PAIN 07/26/2007  . Palpitations 09/21/2008  . Grave's disease     hx of Grave's Disease/Hyperthyroidism  . Hx of migraines   . Anxiety 06/02/2010  . Insomnia 06/02/2010  . 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 Lumbar degenerative disc disease 01/20/2011  . Hyperlipidemia 12/17/2011   BP  130/70 mmHg  Pulse 78  Resp 14  SpO2 99%  Opioid Risk Score:   Fall Risk Score: Low Fall Risk (0-5 points)`1  Depression screen PHQ 2/9  Depression screen PHQ 2/9 05/01/2014  Decreased Interest 1  Down, Depressed, Hopeless 1  PHQ - 2 Score 2  Altered sleeping 2  Tired, decreased energy 2  Change in appetite 0  Feeling bad or failure about yourself  1  Trouble concentrating 0  Moving slowly or fidgety/restless 0  Suicidal thoughts 0  PHQ-9 Score 7     Review of Systems  Psychiatric/Behavioral: The patient is nervous/anxious.   All other systems reviewed and are negative.        Objective:   Physical Exam  Constitutional: She is oriented to person, place, and time. She appears well-developed and well-nourished.  HENT:  Head: Normocephalic and atraumatic.  Neck: Normal range of motion.  Pain with extension of the cervical spine  Musculoskeletal:  Lumbar range of motion is normal including flexion extension lateral bending and rotation, thoracic range of motion is normal as well  Neurological: She is alert and oriented to person, place, and time. She displays no atrophy. No sensory deficit. Coordination and gait normal.  Reflex Scores:      Tricep reflexes are 2+ on the right side and 2+ on the left side.      Bicep reflexes are 2+ on the right side and 2+ on the left side.      Brachioradialis reflexes are 2+ on the right side and 2+ on the left side.      Patellar reflexes are 2+ on the right side and 2+ on the left side.      Achilles reflexes are 2+ on the right side and 2+ on the left side. Motor strength is 5/5 bilateral deltoid biceps triceps grip, hip flexor, knee extensor, ankle dorsi and plantar flexion   Psychiatric: She has a normal mood and affect.  Nursing note and vitals reviewed.         Assessment & Plan:  1.Lumbar spondylosis overall improved. She is doing very well in terms of her low back pain  2. Cervical pain likely combination of cervical spondylosis and cervical degenerative disc, no radicular component. She feels like her cervical pain helps trigger the migraines. We discussed her positive short-term response to cervical medial branch blocks and that she may be a candidate for radiofrequency she will be following up with [facility] on this.  3. Cervical occipital neuralgia she has had some good relief with nerve blocks, we'll follow up with neurology on this. She may be a candidate for occipital nerve cryoablation  4. Chronic pain syndrome multifactorial, we discussed the fact that OxyContin was not present in her urine,  we also discussed the fact that this is not a good when necessary medication given that it is a long acting medication. We will discontinue this in fact we have not written it since the urine drug screen. Instead we will give extra tablet of hydrocodone which can be used in the morning and 1/2-1 tablet in addition to the current dose of 7.5 mg tablet. Her additional dosages will be in the afternoons one tablet and at night 1 tablet  5. Chronic migraines these are basically nightly and keep her awake for about 4 hours per night Recommend Botox injection as prophylactic agent given that she has  been on Topamax with only some reduction in headache intensity but not frequency  Over  half of the 25 min visit was spent counseling and coordinating care.

## 2014-05-01 NOTE — Progress Notes (Signed)
Subjective:    Patient ID: Cassandra Gallagher, female    DOB: 1960/08/25, 54 y.o.   MRN: [id number]  HPI  Pain Inventory Average Pain 6 Pain Right Now 6 My pain is intermittent, sharp, stabbing, aching and burning  In the last 24 hours, has pain interfered with the following? General activity 8 Relation with others 7 Enjoyment of life 7 What TIME of day is your pain at its worst? morning and night Sleep (in general) Poor  Pain is worse with: walking Pain improves with: heat/ice, medication and injections Relief from Meds: 8  Mobility walk without assistance how many minutes can you walk? 15 ability to climb steps?  yes do you drive?  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  no  Physicians involved in your care Any changes since last visit?  no   Family History  Problem Relation Age of Onset  . Hypertension Mother    History   Social History  . Marital Status: Significant Other    Spouse Name: N/A  . Number of Children: 2  . 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 Smoking status: Former Research scientist (life sciences)  . Smokeless tobacco: Never Used     Comment: smoked for 6 years, quit 15 years ago  . Alcohol Use: No  . Drug Use: No  . Sexual Activity: Not on file   Other Topics Concern  . None   Social History Narrative   2nd child born recently in September 2010.   Married, one child from previous marriage lives in [place]   Past Surgical History  Procedure Laterality Date  . Breast biopsy    . Cesarean section  10/29/2008    twins  . Dilitation & currettage/hystroscopy with versapoint resection N/A 07/20/2012    Procedure: DILATATION & CURETTAGE/HYSTEROSCOPY WITH VERSAPOINT RESECTION;  Surgeon: Princess Bruins, MD;  Location: [facility];  Service: Gynecology;  Laterality: N/A;  . Hysteroscopy w/d&c  07-20-12    Past Medical History  Diagnosis Date  . DEPRESSIVE DISORDER 07/26/2007  . UTI 05/11/2007  . Lump or mass in breast 11/14/2008  . 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 Cervicalgia 07/26/2007  . BACK PAIN 07/26/2007  . Palpitations 09/21/2008  . Grave's disease     hx of Grave's Disease/Hyperthyroidism  . Hx of migraines   . Anxiety 06/02/2010  . Insomnia 06/02/2010  . 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 Lumbar degenerative disc disease 01/20/2011  . Hyperlipidemia 12/17/2011   BP 130/70 mmHg  Pulse 78  Resp 14  SpO2 99%  Opioid Risk Score:   Fall Risk Score: Low Fall Risk (0-5 points)`1  Depression screen PHQ 2/9  Depression screen PHQ 2/9 05/01/2014  Decreased Interest 1  Down, Depressed, Hopeless 1  PHQ - 2 Score 2  Altered sleeping 2  Tired, decreased energy 2  Change in appetite 0  Feeling bad or failure about yourself  1  Trouble concentrating 0  Moving slowly or fidgety/restless 0  Suicidal thoughts 0  PHQ-9 Score 7     Review of Systems  Constitutional: Negative.   Eyes: Negative.   Respiratory: Negative.   Cardiovascular: Negative.   Gastrointestinal: Negative.   Endocrine: Negative.   Genitourinary: Negative.   Musculoskeletal: Positive for neck pain.  Allergic/Immunologic: Negative.   Neurological: Positive for headaches.  Hematological: Negative.   Psychiatric/Behavioral: The patient is nervous/anxious.        Objective:   Physical Exam        Assessment & Plan:

## 2014-05-21 ENCOUNTER — Other Ambulatory Visit: Payer: Self-pay | Admitting: Internal Medicine

## 2014-05-24 ENCOUNTER — Telehealth: Payer: Self-pay | Admitting: *Deleted

## 2014-05-24 NOTE — Telephone Encounter (Signed)
Patient asking for a call back from you regarding Botox treatment, if it has been approved or not, I talked to [place] about this, He said it has not been approved by [facility] yet and that he would follow through.Pt ALSO is concerned that she will run out of medication prior to the 06/05/14 appt with Dr. Letta Pate and is wondering how we might handle that situation

## 2014-05-28 ENCOUNTER — Telehealth: Payer: Self-pay | Admitting: Registered Nurse

## 2014-05-28 MED ORDER — HYDROCODONE-ACETAMINOPHEN 7.5-325 MG PO TABS: 1.0000 | ORAL_TABLET | Freq: Four times a day (QID) | ORAL | Status: DC | PRN

## 2014-05-28 NOTE — Telephone Encounter (Signed)
Cassandra Gallagher is aware we are still awaiting Botox approval she verbalizes understanding. Also aware her botox injection will be with Dr. Naaman Plummer. Office staff trying to get approval and her schedule will be changed with Dr. Naaman Plummer

## 2014-05-28 NOTE — Telephone Encounter (Signed)
Return Cassandra Gallagher call, she was instructed to pick up a bridge prescription until her appointment with Dr. Letta Pate on 06/05/14. She verbalizes understanding.

## 2014-05-28 NOTE — Telephone Encounter (Signed)
Pt called again asking for you to call her concerning that she is going to run out of meds this week and has a procedure appt with Dr. Letta Pate on 06/05/2014. She is hoping to avoid 2 separate visits....wondering what we could do?

## 2014-05-28 NOTE — Telephone Encounter (Signed)
[name] has been approved her appointment is 05/29/14 with Dr. Naaman Plummer. She is aware. Bridge prescription has been discarded.

## 2014-05-29 ENCOUNTER — Encounter: Payer: Self-pay | Admitting: Physical Medicine & Rehabilitation

## 2014-05-29 ENCOUNTER — Encounter
Payer: BC Managed Care – PPO | Attending: Physical Medicine and Rehabilitation | Admitting: Physical Medicine & Rehabilitation

## 2014-05-29 VITALS — BP 117/70 | HR 84 | Resp 14

## 2014-05-29 DIAGNOSIS — IMO0002 Reserved for concepts with insufficient information to code with codable children: Secondary | ICD-10-CM

## 2014-05-29 DIAGNOSIS — M5481 Occipital neuralgia: Secondary | ICD-10-CM | POA: Insufficient documentation

## 2014-05-29 DIAGNOSIS — G43709 Chronic migraine without aura, not intractable, without status migrainosus: Secondary | ICD-10-CM | POA: Diagnosis not present

## 2014-05-29 DIAGNOSIS — F419 Anxiety disorder, unspecified: Secondary | ICD-10-CM | POA: Insufficient documentation

## 2014-05-29 DIAGNOSIS — M47817 Spondylosis without myelopathy or radiculopathy, lumbosacral region: Secondary | ICD-10-CM | POA: Insufficient documentation

## 2014-05-29 DIAGNOSIS — G8929 Other chronic pain: Secondary | ICD-10-CM | POA: Insufficient documentation

## 2014-05-29 DIAGNOSIS — M5136 Other intervertebral disc degeneration, lumbar region: Secondary | ICD-10-CM | POA: Diagnosis not present

## 2014-05-29 MED ORDER — HYDROCODONE-ACETAMINOPHEN 7.5-325 MG PO TABS: 1.0000 | ORAL_TABLET | Freq: Four times a day (QID) | ORAL | Status: DC | PRN

## 2014-05-29 NOTE — Progress Notes (Signed)
Botox Injection for chronic migraine headaches  (G43.709)  Dilution: 100 Units/ 39ml preservative free NS Indication: refractory headaches incompletely responsive to other more conservative measures.  Informed consent was obtained after describing risks and benefits of the procedure with the patient. This includes bleeding, bruising, infection, excessive weakness, or medication side effects. A REMS form is on file and signed. Needle: 27g 1/2 inch needle   Number of units per muscle:  Right temporalis 20 units, 4 access points Left temporalis 20 units,  4 access points Right frontalis 10 units, 2 access points Left frontalis 10 units, 2 access points Procerus 5 units, 1 access point Right corrugator 5 units, 1 access point Left corrugator 5 units, 1 access point Right occipitalis 15 units, 3 access points Left occipitalis 15 units, 3 access points Right cervical paraspinal 10 units, 2 access points Left cervical paraspinals 10 units, 2 access points Right cervical paraspinal 10 units, 2 access points Right trapezius 15 units, 3 access points Left trapezius 15 units, 3 access points   All injections were done after  after negative drawback for blood. The patient tolerated the procedure well. Post procedure instructions were given. A followup appointment was made.  The patient was asked to keep a headache diary over the next two months to judge the efficacy of these injections.  She will judge overall headache/cervicalgia in AM/PM as well as the frequency and intensity of severe migraine flares.   An rx for hydrocodone 7.5/325 was written today #120

## 2014-05-29 NOTE — Patient Instructions (Signed)
KEEP A HEADACHE DIARY PLEASE.

## 2014-06-05 ENCOUNTER — Ambulatory Visit: Payer: BC Managed Care – PPO | Admitting: Physical Medicine & Rehabilitation

## 2014-06-19 ENCOUNTER — Other Ambulatory Visit: Payer: Self-pay | Admitting: Internal Medicine

## 2014-06-19 NOTE — Telephone Encounter (Signed)
Done hardcopy to Cherina  

## 2014-06-20 NOTE — Telephone Encounter (Signed)
Rx faxed to pharmacy per pt request

## 2014-06-21 ENCOUNTER — Other Ambulatory Visit: Payer: Self-pay | Admitting: Internal Medicine

## 2014-06-26 ENCOUNTER — Encounter: Payer: BC Managed Care – PPO | Admitting: Registered Nurse

## 2014-06-26 ENCOUNTER — Ambulatory Visit (HOSPITAL_BASED_OUTPATIENT_CLINIC_OR_DEPARTMENT_OTHER): Payer: BC Managed Care – PPO | Admitting: Physical Medicine & Rehabilitation

## 2014-06-26 ENCOUNTER — Encounter: Payer: BC Managed Care – PPO | Attending: Physical Medicine and Rehabilitation

## 2014-06-26 ENCOUNTER — Encounter: Payer: Self-pay | Admitting: Physical Medicine & Rehabilitation

## 2014-06-26 ENCOUNTER — Other Ambulatory Visit: Payer: Self-pay | Admitting: Physical Medicine & Rehabilitation

## 2014-06-26 VITALS — BP 118/70 | HR 68 | Resp 14

## 2014-06-26 DIAGNOSIS — G43709 Chronic migraine without aura, not intractable, without status migrainosus: Secondary | ICD-10-CM | POA: Insufficient documentation

## 2014-06-26 DIAGNOSIS — Z79899 Other long term (current) drug therapy: Secondary | ICD-10-CM

## 2014-06-26 DIAGNOSIS — M47817 Spondylosis without myelopathy or radiculopathy, lumbosacral region: Secondary | ICD-10-CM | POA: Diagnosis present

## 2014-06-26 DIAGNOSIS — M47812 Spondylosis without myelopathy or radiculopathy, cervical region: Secondary | ICD-10-CM | POA: Diagnosis not present

## 2014-06-26 DIAGNOSIS — Z5181 Encounter for therapeutic drug level monitoring: Secondary | ICD-10-CM

## 2014-06-26 DIAGNOSIS — F419 Anxiety disorder, unspecified: Secondary | ICD-10-CM | POA: Insufficient documentation

## 2014-06-26 DIAGNOSIS — M5481 Occipital neuralgia: Secondary | ICD-10-CM | POA: Diagnosis not present

## 2014-06-26 DIAGNOSIS — G8929 Other chronic pain: Secondary | ICD-10-CM | POA: Diagnosis not present

## 2014-06-26 DIAGNOSIS — G894 Chronic pain syndrome: Secondary | ICD-10-CM

## 2014-06-26 DIAGNOSIS — M5136 Other intervertebral disc degeneration, lumbar region: Secondary | ICD-10-CM | POA: Insufficient documentation

## 2014-06-26 DIAGNOSIS — IMO0002 Reserved for concepts with insufficient information to code with codable children: Secondary | ICD-10-CM

## 2014-06-26 MED ORDER — HYDROCODONE-ACETAMINOPHEN 7.5-325 MG PO TABS: 1.0000 | ORAL_TABLET | Freq: Four times a day (QID) | ORAL | Status: DC | PRN

## 2014-06-26 NOTE — Patient Instructions (Signed)
Try TENS 30-41min

## 2014-06-26 NOTE — Progress Notes (Signed)
Subjective:    Patient ID: Cassandra Gallagher, female    DOB: 1960/05/29, 54 y.o.   MRN: [id number]  HPI Neck pain , headache somewhat improved after botox, not waking up with headaches, still with neck pain Still using same amount of Maxalt, headache less severe, only one episode of vomiting Which resulted in some missed work. Usually has at least 2 of these days per month  Now on Lyrica 100mg  BID, rather than 3 times a day because she is sleeping through the usual time she takes the third dose Sleeping through night, About 6 hours, Still takes hydrocodone a total of 4 tablets per day but she takes them at various times depending on her pain level, occasionally takes 2 at a time  Pain Inventory Average Pain 7 Pain Right Now 3 My pain is constant, sharp, dull, stabbing and aching  In the last 24 hours, has pain interfered with the following? General activity 6 Relation with others 6 Enjoyment of life 6 What TIME of day is your pain at its worst? morning and evening  Sleep (in general) Poor  Pain is worse with: unsure Pain improves with: heat/ice, medication and injections Relief from Meds: 5  Mobility walk without assistance how many minutes can you walk? 15 ability to climb steps?  yes do you drive?  yes  Function disabled: date disabled .  Neuro/Psych anxiety  Prior Studies Any changes since last visit?  no  Physicians involved in your care Any changes since last visit?  no   Family History  Problem Relation Age of Onset  . Hypertension Mother    History   Social History  . Marital Status: Significant Other    Spouse Name: N/A  . Number of Children: 2  . 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 Smoking status: Former Research scientist (life sciences)  . Smokeless tobacco: Never Used     Comment: smoked for 6 years, quit 15 years ago  . Alcohol Use: No  . Drug Use: No  . Sexual Activity: Not on file   Other Topics  Concern  . None   Social History Narrative   2nd child born recently in September 2010.   Married, one child from previous marriage lives in [place]   Past Surgical History  Procedure Laterality Date  . Breast biopsy    . Cesarean section  10/29/2008    twins  . Dilitation & currettage/hystroscopy with versapoint resection N/A 07/20/2012    Procedure: DILATATION & CURETTAGE/HYSTEROSCOPY WITH VERSAPOINT RESECTION;  Surgeon: Princess Bruins, MD;  Location: [facility];  Service: Gynecology;  Laterality: N/A;  . Hysteroscopy w/d&c  07-20-12   Past Medical History  Diagnosis Date  . DEPRESSIVE DISORDER 07/26/2007  . UTI 05/11/2007  . Lump or mass in breast 11/14/2008  . 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 Cervicalgia 07/26/2007  . BACK PAIN 07/26/2007  . Palpitations 09/21/2008  . Grave's disease     hx of Grave's Disease/Hyperthyroidism  . Hx of migraines   . Anxiety 06/02/2010  . Insomnia 06/02/2010  . 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 Lumbar degenerative disc disease 01/20/2011  . Hyperlipidemia 12/17/2011   BP 118/70 mmHg  Pulse 68  Resp 14  SpO2 99%  Opioid Risk Score:   Fall Risk Score: Low Fall Risk (0-5 points)`1  Depression screen PHQ 2/9  Depression screen [facility] 2/9 05/01/2014 05/01/2014  Decreased Interest 1 1  Down, Depressed, Hopeless 1 1  PHQ - 2 Score 2 2  Altered sleeping 2 2  Tired, decreased energy 2 2  Change in appetite 0 0  Feeling bad or failure about yourself  1 1  Trouble concentrating 0 0  Moving slowly or fidgety/restless 0 0  Suicidal thoughts 0 0  PHQ-9 Score 7 7     Review of Systems  Constitutional: Negative.   Eyes: Negative.   Respiratory: Negative.   Cardiovascular: Negative.   Gastrointestinal: Negative.   Endocrine: Negative.   Genitourinary: Negative.   Musculoskeletal: Positive for myalgias, arthralgias and neck pain.  Allergic/Immunologic: Negative.   Neurological: Positive for headaches.  Hematological:  Negative.   Psychiatric/Behavioral: The patient is nervous/anxious.        Objective:   Physical Exam  Constitutional: She is oriented to person, place, and time. She appears well-developed and well-nourished.  Neurological: She is alert and oriented to person, place, and time.  Psychiatric: She has a normal mood and affect.  Nursing note and vitals reviewed.  Examination no tenderness over the occipital protuberance. No tenderness over the cervical paraspinal muscles. She has normal cervical range of motion but does have some pain with cervical extension. Lumbar spine is no tenderness palpation in the paraspinal area Normal lumbar range of motion in flexion, extension, lateral bending. Normal strength in bilateral upper and lower limbs.      Assessment & Plan:  1.Chronic migraines, these are triggered by some cervical spondylosis-related pain. Headache frequency as well as severity reduced. Recommend repeat procedure in 2 months. Cont Lyrica 100mg  BID per Neurology, Dr Melton Alar  2. Cervical spondylosis And degenerative discNo sign of radiculopathy. No need for epidural at the current time. In the past she has benefited from cervical medial branch blocks these can be repeated if needed as well.  3. Chronic pain syndrome multifactorial, repeat urine drug screen, her pain medications have been helpful to allow her to work full-time without restrictions. Cont Hydrocodone 7.5 mg 4 tabs per day Continue opioid monitoring program. This consists of regular clinic visits, examinations, urine drug screen, pill counts as well as use of [place] controlled substance reporting System.  4. Lumbar spondylosis she's been really asymptomatic from the lumbar spine standpoint for a while. She continues with her stretching program. I assist her walking some more this summer

## 2014-06-27 LAB — PMP ALCOHOL METABOLITE (ETG): ETGU: NEGATIVE ng/mL

## 2014-06-29 LAB — BENZODIAZEPINES (GC/LC/MS), URINE
Alprazolam metabolite (GC/LC/MS), ur confirm: NEGATIVE ng/mL (ref <25)
Clonazepam metabolite (GC/LC/MS), ur confirm: 177 ng/mL (ref <25)
Flurazepam metabolite (GC/LC/MS), ur confirm: NEGATIVE ng/mL (ref <50)
LORAZEPAMU: NEGATIVE ng/mL (ref <50)
Midazolam (GC/LC/MS), ur confirm: NEGATIVE ng/mL (ref <50)
NORDIAZEPAMU: NEGATIVE ng/mL (ref <50)
Oxazepam (GC/LC/MS), ur confirm: NEGATIVE ng/mL (ref <50)
TEMAZEPAMU: NEGATIVE ng/mL (ref <50)
Triazolam metabolite (GC/LC/MS), ur confirm: NEGATIVE ng/mL (ref <50)

## 2014-06-29 LAB — OPIATES/OPIOIDS (LC/MS-MS)
Codeine Urine: NEGATIVE ng/mL (ref <50)
Hydrocodone: 809 ng/mL (ref <50)
Hydromorphone: 96 ng/mL (ref <50)
MORPHINE: NEGATIVE ng/mL (ref <50)
NORHYDROCODONE, UR: 1254 ng/mL (ref <50)
Noroxycodone, Ur: NEGATIVE ng/mL (ref <50)
OXYCODONE, UR: NEGATIVE ng/mL (ref <50)
OXYMORPHONE, URINE: NEGATIVE ng/mL (ref <50)

## 2014-06-30 LAB — PRESCRIPTION MONITORING PROFILE (SOLSTAS)
Amphetamine/Meth: NEGATIVE ng/mL
Barbiturate Screen, Urine: NEGATIVE ng/mL
Buprenorphine, Urine: NEGATIVE ng/mL
CANNABINOID SCRN UR: NEGATIVE ng/mL
CARISOPRODOL, URINE: NEGATIVE ng/mL
COCAINE METABOLITES: NEGATIVE ng/mL
Creatinine, Urine: 76.64 mg/dL (ref >20.0)
ECSTASY: NEGATIVE ng/mL
Fentanyl, Ur: NEGATIVE ng/mL
Meperidine, Ur: NEGATIVE ng/mL
Methadone Screen, Urine: NEGATIVE ng/mL
NITRITES URINE, INITIAL: NEGATIVE ug/mL
Oxycodone Screen, Ur: NEGATIVE ng/mL
PH URINE, INITIAL: 5.7 pH (ref 4.5–8.9)
Propoxyphene: NEGATIVE ng/mL
TAPENTADOLUR: NEGATIVE ng/mL
Tramadol Scrn, Ur: NEGATIVE ng/mL
Zolpidem, Urine: NEGATIVE ng/mL

## 2014-07-11 NOTE — Progress Notes (Signed)
Urine drug screen for this encounter is consistent for prescribed medication 

## 2014-07-19 ENCOUNTER — Ambulatory Visit: Payer: BC Managed Care – PPO | Admitting: Registered Nurse

## 2014-07-23 ENCOUNTER — Encounter: Payer: Self-pay | Admitting: Physical Medicine & Rehabilitation

## 2014-07-23 ENCOUNTER — Encounter: Payer: BC Managed Care – PPO | Attending: Physical Medicine and Rehabilitation

## 2014-07-23 ENCOUNTER — Ambulatory Visit (HOSPITAL_BASED_OUTPATIENT_CLINIC_OR_DEPARTMENT_OTHER): Payer: BC Managed Care – PPO | Admitting: Physical Medicine & Rehabilitation

## 2014-07-23 VITALS — BP 115/70 | HR 72 | Resp 16

## 2014-07-23 DIAGNOSIS — R519 Headache, unspecified: Secondary | ICD-10-CM

## 2014-07-23 DIAGNOSIS — G8929 Other chronic pain: Secondary | ICD-10-CM | POA: Insufficient documentation

## 2014-07-23 DIAGNOSIS — M47812 Spondylosis without myelopathy or radiculopathy, cervical region: Secondary | ICD-10-CM | POA: Diagnosis not present

## 2014-07-23 DIAGNOSIS — M5136 Other intervertebral disc degeneration, lumbar region: Secondary | ICD-10-CM | POA: Diagnosis not present

## 2014-07-23 DIAGNOSIS — R51 Headache: Secondary | ICD-10-CM | POA: Diagnosis not present

## 2014-07-23 DIAGNOSIS — G43709 Chronic migraine without aura, not intractable, without status migrainosus: Secondary | ICD-10-CM | POA: Insufficient documentation

## 2014-07-23 DIAGNOSIS — M47817 Spondylosis without myelopathy or radiculopathy, lumbosacral region: Secondary | ICD-10-CM | POA: Diagnosis not present

## 2014-07-23 DIAGNOSIS — F419 Anxiety disorder, unspecified: Secondary | ICD-10-CM | POA: Insufficient documentation

## 2014-07-23 DIAGNOSIS — M5481 Occipital neuralgia: Secondary | ICD-10-CM

## 2014-07-23 DIAGNOSIS — IMO0002 Reserved for concepts with insufficient information to code with codable children: Secondary | ICD-10-CM

## 2014-07-23 MED ORDER — HYDROCODONE-ACETAMINOPHEN 7.5-325 MG PO TABS: 1.0000 | ORAL_TABLET | Freq: Four times a day (QID) | ORAL | Status: DC | PRN

## 2014-07-23 NOTE — Progress Notes (Signed)
Subjective:    Patient ID: Cassandra Gallagher, female    DOB: 1960/03/31, 54 y.o.   MRN: [id number] Chief complaint is neck pain and headache  HPI   54 year old female with complicated neck and headache pain. She is seen numerous physicians including Dr. Melton Alar from the headache clinic, A neurologist from [place] as well as anesthesiology pain management specialist at [facility] pain management clinic. She has several diagnoses including cervical spondylosis, chronic migraine, occipital neuralgia.  She had a Botox injection for chronic migraines performed in April. She had a couple headaches in April right after the injection but then she had a lower than usual headache count in May. Her total headaches in May were about 7 which is less than half of usual. Unfortunately June is already up to about 7 or 8. Complicating issues include side effects from Lyrica. Patient has some blurring of vision see ophthalmologist who states she had very dry eyes as well as an early cataract. Did not think that the cataract was related to the Lyrica.    Patient has followed up with neurology, advised reducing dose of Lyrica to see if side effects resolved. No clear-cut resolution of side effects with reduced dose but the patient never came completely off the Lyrica.   Pain Inventory Average Pain 3 Pain Right Now 7 My pain is burning, dull, tingling and aching  In the last 24 hours, has pain interfered with the following? General activity 5 Relation with others 5 Enjoyment of life 5 What TIME of day is your pain at its worst? morning and night Sleep (in general) Poor  Pain is worse with: unsure Pain improves with: heat/ice, medication, TENS and other Relief from Meds: did not answer  Mobility walk without assistance ability to climb steps?  yes do you drive?  yes  Function employed # of hrs/week 30 Do you have any goals in this area?  no  Neuro/Psych No problems in this  area  Prior Studies Any changes since last visit?  no  Physicians involved in your care Any changes since last visit?  yes  Saw an opthamolgist   Family History  Problem Relation Age of Onset  . Hypertension Mother    History   Social History  . Marital Status: Significant Other    Spouse Name: N/A  . Number of Children: 2  . 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 Smoking status: Former Research scientist (life sciences)  . Smokeless tobacco: Never Used     Comment: smoked for 6 years, quit 15 years ago  . Alcohol Use: No  . Drug Use: No  . Sexual Activity: Not on file   Other Topics Concern  . None   Social History Narrative   2nd child born recently in September 2010.   Married, one child from previous marriage lives in [place]   Past Surgical History  Procedure Laterality Date  . Breast biopsy    . Cesarean section  10/29/2008    twins  . Dilitation & currettage/hystroscopy with versapoint resection N/A 07/20/2012    Procedure: DILATATION & CURETTAGE/HYSTEROSCOPY WITH VERSAPOINT RESECTION;  Surgeon: Princess Bruins, MD;  Location: [facility];  Service: Gynecology;  Laterality: N/A;  . Hysteroscopy w/d&c  07-20-12   Past Medical History  Diagnosis Date  . DEPRESSIVE DISORDER 07/26/2007  . UTI 05/11/2007  . Lump or mass in breast 11/14/2008  . DEGENERATIVE DISC DISEASE, CERVICAL  SPINE 06/07/2009  . Cervicalgia 07/26/2007  . BACK PAIN 07/26/2007  . Palpitations 09/21/2008  . Grave's disease     hx of Grave's Disease/Hyperthyroidism  . Hx of migraines   . Anxiety 06/02/2010  . Insomnia 06/02/2010  . 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 Lumbar degenerative disc disease 01/20/2011  . Hyperlipidemia 12/17/2011   bp 115/70  p 72  r 16 O2 sat 97% Opioid Risk Score:   Fall Risk Score: Low Fall Risk (0-5 points)`1  Depression screen PHQ 2/9  Depression screen [facility] 2/9 05/01/2014 05/01/2014  Decreased Interest 1 1  Down,  Depressed, Hopeless 1 1  PHQ - 2 Score 2 2  Altered sleeping 2 2  Tired, decreased energy 2 2  Change in appetite 0 0  Feeling bad or failure about yourself  1 1  Trouble concentrating 0 0  Moving slowly or fidgety/restless 0 0  Suicidal thoughts 0 0  PHQ-9 Score 7 7    Review of Systems  Constitutional: Positive for unexpected weight change.  Gastrointestinal: Positive for nausea.  Musculoskeletal: Positive for neck pain.  Neurological: Positive for headaches.  All other systems reviewed and are negative.      Objective:   Physical Exam  Constitutional: She is oriented to person, place, and time. She appears well-developed and well-nourished.  HENT:  Head: Normocephalic and atraumatic.  Neck: Normal range of motion.  Neurological: She is alert and oriented to person, place, and time.  Patient has full cervical thoracic and lumbar range of motion without pain. No tenderness to palpation along the cervical paraspinals or along the lumbar paraspinals.  Psychiatric: She has a normal mood and affect.  Nursing note and vitals reviewed.         Assessment & Plan:  1. Chronic neck and headache pain, multifactorial. Patient has elements of the following diagnoses, Cervical spondylosis, cervical degenerative disc. At this point I do not think her symptoms are mechanical as they cannot be provoked by any particular movement. In addition her symptoms seem to be well controlled with higher dose Lyrica, about 200 mg per day indicating that this may be more as a neuropathic pain or a central sensitization syndrome.  In addition patient has chronic migraines which are in part triggered by her neck pain, she seemed to get good relief with the Botox while she was on the Lyrica however the change in Lyrica dosages has made it difficult to assess the effect of the Botox. I do think it is recommended to repeat this again to see what the pattern of response is  Occipital neuralgia, has had  some relief in the past with occipital nerve block, was evaluated for potential peripheral nerve stimulator. Patient will need to have better idea in terms of response to Botox as well as a stable dosage of Lyrica or perhaps trial of another agent Cymbalta or Lyrica at the discretion of her neurologist.  Patient here with her husband, discussed all her numerous issues including medication management diagnosis interventional procedures Over half of the 25 min visit was spent counseling and coordinating care.

## 2014-07-23 NOTE — Patient Instructions (Signed)
Levetiracetam tablets What is this medicine? LEVETIRACETAM (lee ve tye RA se tam) is an antiepileptic drug. It is used with other medicines to treat certain types of seizures. This medicine may be used for other purposes; ask your health care provider or pharmacist if you have questions. COMMON BRAND NAME(S): Keppra What should I tell my health care provider before I take this medicine? They need to know if you have any of these conditions: -kidney disease -suicidal thoughts, plans, or attempt; a previous suicide attempt by you or a family member -an unusual or allergic reaction to levetiracetam, other medicines, foods, dyes, or preservatives -pregnant or trying to get pregnant -breast-feeding How should I use this medicine? Take this medicine by mouth with a glass of water. Follow the directions on the prescription label. Swallow the tablets whole. Do not crush or chew this medicine. You may take this medicine with or without food. Take your doses at regular intervals. Do not take your medicine more often than directed. Do not stop taking this medicine or any of your seizure medicines unless instructed by your doctor or health care professional. Stopping your medicine suddenly can increase your seizures or their severity. A special MedGuide will be given to you by the pharmacist with each prescription and refill. Be sure to read this information carefully each time. Contact your pediatrician or health care professional regarding the use of this medication in children. While this drug may be prescribed for children as young as 4 years of age for selected conditions, precautions do apply. Overdosage: If you think you have taken too much of this medicine contact a poison control center or emergency room at once. NOTE: This medicine is only for you. Do not share this medicine with others. What if I miss a dose? If you miss a dose, take it as soon as you can. If it is almost time for your next dose,  take only that dose. Do not take double or extra doses. What may interact with this medicine? This medicine may interact with the following medications: -carbamazepine -colesevelam -probenecid -sevelamer This list may not describe all possible interactions. Give your health care provider a list of all the medicines, herbs, non-prescription drugs, or dietary supplements you use. Also tell them if you smoke, drink alcohol, or use illegal drugs. Some items may interact with your medicine. What should I watch for while using this medicine? Visit your doctor or health care professional for a regular check on your progress. Wear a medical identification bracelet or chain to say you have epilepsy, and carry a card that lists all your medications. It is important to take this medicine exactly as instructed by your health care professional. When first starting treatment, your dose may need to be adjusted. It may take weeks or months before your dose is stable. You should contact your doctor or health care professional if your seizures get worse or if you have any new types of seizures. You may get drowsy or dizzy. Do not drive, use machinery, or do anything that needs mental alertness until you know how this medicine affects you. Do not stand or sit up quickly, especially if you are an older patient. This reduces the risk of dizzy or fainting spells. Alcohol may interfere with the effect of this medicine. Avoid alcoholic drinks. The use of this medicine may increase the chance of suicidal thoughts or actions. Pay special attention to how you are responding while on this medicine. Any worsening of mood, or thoughts   of suicide or dying should be reported to your health care professional right away. Women who become pregnant while using this medicine may enroll in the [place] Antiepileptic Drug Pregnancy Registry by calling 1-888-233-2334. This registry collects information about the safety of antiepileptic  drug use during pregnancy. What side effects may I notice from receiving this medicine? Side effects you should report to your doctor or health care professional as soon as possible: -allergic reactions like skin rash, itching or hives, swelling of the face, lips, or tongue -breathing problems -dark urine -general ill feeling or flu-like symptoms -problems with balance, talking, walking -unusually weak or tired -worsening of mood, thoughts or actions of suicide or dying -yellowing of the eyes or skin Side effects that usually do not require medical attention (report to your doctor or health care professional if they continue or are bothersome): -diarrhea -dizzy, drowsy -headache -loss of appetite This list may not describe all possible side effects. Call your doctor for medical advice about side effects. You may report side effects to FDA at 1-800-FDA-1088. Where should I keep my medicine? Keep out of reach of children. Store at room temperature between 15 and 30 degrees C (59 and 86 degrees F). Throw away any unused medicine after the expiration date. NOTE: This sheet is a summary. It may not cover all possible information. If you have questions about this medicine, talk to your doctor, pharmacist, or health care provider.  2015, Elsevier/Gold Standard. (2012-12-20 08:42:48)  

## 2014-08-01 ENCOUNTER — Ambulatory Visit: Payer: BC Managed Care – PPO | Admitting: Physical Medicine & Rehabilitation

## 2014-08-14 ENCOUNTER — Other Ambulatory Visit: Payer: Self-pay

## 2014-08-14 DIAGNOSIS — Z1231 Encounter for screening mammogram for malignant neoplasm of breast: Secondary | ICD-10-CM

## 2014-08-15 ENCOUNTER — Ambulatory Visit
Admission: RE | Admit: 2014-08-15 | Discharge: 2014-08-15 | Disposition: A | Discharge status: Home / Self Care | Payer: BC Managed Care – PPO | Source: Ambulatory Visit

## 2014-08-15 DIAGNOSIS — Z1231 Encounter for screening mammogram for malignant neoplasm of breast: Secondary | ICD-10-CM

## 2014-08-16 ENCOUNTER — Other Ambulatory Visit: Payer: Self-pay | Admitting: Internal Medicine

## 2014-08-16 DIAGNOSIS — N6314 Unspecified lump in the right breast, lower inner quadrant: Secondary | ICD-10-CM

## 2014-08-20 ENCOUNTER — Ambulatory Visit
Admission: RE | Admit: 2014-08-20 | Discharge: 2014-08-20 | Disposition: A | Discharge status: Home / Self Care | Payer: BC Managed Care – PPO | Source: Ambulatory Visit | Attending: Internal Medicine | Admitting: Internal Medicine

## 2014-08-20 DIAGNOSIS — N6314 Unspecified lump in the right breast, lower inner quadrant: Secondary | ICD-10-CM

## 2014-08-21 ENCOUNTER — Ambulatory Visit: Payer: BC Managed Care – PPO | Admitting: Physical Medicine & Rehabilitation

## 2014-08-21 ENCOUNTER — Encounter
Payer: BC Managed Care – PPO | Attending: Physical Medicine and Rehabilitation | Admitting: Physical Medicine & Rehabilitation

## 2014-08-21 ENCOUNTER — Encounter: Payer: Self-pay | Admitting: Physical Medicine & Rehabilitation

## 2014-08-21 ENCOUNTER — Ambulatory Visit: Payer: BC Managed Care – PPO

## 2014-08-21 VITALS — BP 122/60 | HR 70 | Resp 14

## 2014-08-21 DIAGNOSIS — M47817 Spondylosis without myelopathy or radiculopathy, lumbosacral region: Secondary | ICD-10-CM | POA: Insufficient documentation

## 2014-08-21 DIAGNOSIS — M5136 Other intervertebral disc degeneration, lumbar region: Secondary | ICD-10-CM | POA: Insufficient documentation

## 2014-08-21 DIAGNOSIS — F419 Anxiety disorder, unspecified: Secondary | ICD-10-CM | POA: Diagnosis not present

## 2014-08-21 DIAGNOSIS — G43009 Migraine without aura, not intractable, without status migrainosus: Secondary | ICD-10-CM | POA: Diagnosis not present

## 2014-08-21 DIAGNOSIS — G43709 Chronic migraine without aura, not intractable, without status migrainosus: Secondary | ICD-10-CM | POA: Insufficient documentation

## 2014-08-21 DIAGNOSIS — M5481 Occipital neuralgia: Secondary | ICD-10-CM | POA: Insufficient documentation

## 2014-08-21 DIAGNOSIS — G8929 Other chronic pain: Secondary | ICD-10-CM | POA: Diagnosis not present

## 2014-08-21 MED ORDER — HYDROCODONE-ACETAMINOPHEN 7.5-325 MG PO TABS: 1.0000 | ORAL_TABLET | Freq: Four times a day (QID) | ORAL | Status: DC | PRN

## 2014-08-21 NOTE — Progress Notes (Signed)
Botox Injection for chronic migraine headaches ICD 10: 346.10  Dilution: 100 Units/ 38ml preservative free NS Indication: refractory headaches incompletely responsive to other more conservative measures.  Informed consent was obtained after describing risks and benefits of the procedure with the patient. This includes bleeding, bruising, infection, excessive weakness, or medication side effects. A REMS form is on file and signed. Needle: 27g 1/2 inch needle   Number of units per muscle:  Right temporalis 20 units, 4 access points Left temporalis 20 units,  4 access points Right frontalis 10 units, 2 access points Left frontalis 10 units, 2 access points Procerus 5 units, 1 access point Right corrugator 5 units, 1 access point Left corrugator 5 units, 1 access point Right occipitalis 15 units, 3 access points Left occipitalis 15 units, 3 access points Right cervical paraspinal 10 units, 2 access points Left cervical paraspinals 10 units, 2 access points Right cervical paraspinal 10 units, 2 access points Right trapezius 15 units, 3 access points Left trapezius 15 units, 3 access points     All injections were done after  after negative drawback for blood. The patient tolerated the procedure well. Post procedure instructions were given. A followup appointment was made with Dr. Letta Pate for one month  Hydrocodone was refilled today.

## 2014-08-21 NOTE — Patient Instructions (Signed)
PLEASE CALL ME WITH ANY PROBLEMS OR QUESTIONS (#[phone]).  HAVE A GOOD DAY!    

## 2014-09-15 ENCOUNTER — Other Ambulatory Visit: Payer: Self-pay | Admitting: Internal Medicine

## 2014-09-17 ENCOUNTER — Other Ambulatory Visit: Payer: Self-pay

## 2014-09-17 MED ORDER — RIZATRIPTAN BENZOATE 10 MG PO TABS: 10.0000 mg | ORAL_TABLET | ORAL | Status: DC | PRN

## 2014-09-17 MED ORDER — ZOLMITRIPTAN 5 MG NA SOLN: 1.0000 | NASAL | Status: DC | PRN

## 2014-09-18 ENCOUNTER — Telehealth: Payer: Self-pay

## 2014-09-18 NOTE — Telephone Encounter (Signed)
Received pharmacy rejection stating that insurance will not cover zomig without a prior authorization, preferred alternative are sumatriptan and rizatriptan. Per Epic, patient already takes rizatriptan as well as topamax. Please clarify if pt should be taking zomig in addition to the other two medication and if so, do you wish to proceed with PA. Thanks .

## 2014-09-18 NOTE — Telephone Encounter (Signed)
Pharmacy notified of denial to do PA as well as changing to sumatriptan.

## 2014-09-18 NOTE — Addendum Note (Signed)
Addended by: Biagio Borg on: 09/18/2014 01:04 PM   Modules accepted: Orders, Medications

## 2014-09-18 NOTE — Telephone Encounter (Signed)
She would only need 1, either the rizatriptan or the zomig, but rizatrpitan not working recently, and zomig nasal soln not covered, I could consider changiing to sumatriptan tabs, as this is covered on her insurance  Ok to cont the topamax

## 2014-09-19 ENCOUNTER — Other Ambulatory Visit: Payer: Self-pay | Admitting: Physical Medicine & Rehabilitation

## 2014-09-19 ENCOUNTER — Encounter: Payer: BC Managed Care – PPO | Attending: Physical Medicine and Rehabilitation | Admitting: Registered Nurse

## 2014-09-19 ENCOUNTER — Encounter: Payer: Self-pay | Admitting: Registered Nurse

## 2014-09-19 VITALS — BP 136/73 | HR 76

## 2014-09-19 DIAGNOSIS — F419 Anxiety disorder, unspecified: Secondary | ICD-10-CM | POA: Diagnosis not present

## 2014-09-19 DIAGNOSIS — M5136 Other intervertebral disc degeneration, lumbar region: Secondary | ICD-10-CM | POA: Insufficient documentation

## 2014-09-19 DIAGNOSIS — R51 Headache: Secondary | ICD-10-CM | POA: Diagnosis not present

## 2014-09-19 DIAGNOSIS — M5481 Occipital neuralgia: Secondary | ICD-10-CM | POA: Insufficient documentation

## 2014-09-19 DIAGNOSIS — G43009 Migraine without aura, not intractable, without status migrainosus: Secondary | ICD-10-CM

## 2014-09-19 DIAGNOSIS — Z79899 Other long term (current) drug therapy: Secondary | ICD-10-CM

## 2014-09-19 DIAGNOSIS — M47817 Spondylosis without myelopathy or radiculopathy, lumbosacral region: Secondary | ICD-10-CM | POA: Insufficient documentation

## 2014-09-19 DIAGNOSIS — G43709 Chronic migraine without aura, not intractable, without status migrainosus: Secondary | ICD-10-CM | POA: Diagnosis not present

## 2014-09-19 DIAGNOSIS — G894 Chronic pain syndrome: Secondary | ICD-10-CM

## 2014-09-19 DIAGNOSIS — Z5181 Encounter for therapeutic drug level monitoring: Secondary | ICD-10-CM

## 2014-09-19 DIAGNOSIS — G8929 Other chronic pain: Secondary | ICD-10-CM | POA: Insufficient documentation

## 2014-09-19 DIAGNOSIS — R519 Headache, unspecified: Secondary | ICD-10-CM

## 2014-09-19 MED ORDER — HYDROCODONE-ACETAMINOPHEN 7.5-325 MG PO TABS: 1.0000 | ORAL_TABLET | Freq: Four times a day (QID) | ORAL | Status: DC | PRN

## 2014-09-19 NOTE — Progress Notes (Signed)
Subjective:    Patient ID: Cassandra Gallagher, female    DOB: [birth year], 54 y.o.   MRN: [id number]  HPI: [name] is a 54 year old female who returns for follow up for chronic pain and medication refill. She says her pain is located in her neck. She rates her pain 5. Her current exercise regime is walking and performing stretching exercises and yoga.  Also states she's having less migraines since Botox--avg 2 month. She has a new neurologist.  Pain Inventory Average Pain 9 Pain Right Now 5 My pain is sharp, burning, dull, tingling and aching  In the last 24 hours, has pain interfered with the following? General activity 0 Relation with others 0 Enjoyment of life 0 What TIME of day is your pain at its worst? 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  no  Physicians involved in your care Any changes since last visit?  yes Neurologist Dr Burnard Hawthorne [facility]   Family History  Problem Relation Age of Onset  . Hypertension Mother    Social History   Social History  . Marital Status: Significant Other    Spouse Name: N/A  . Number of Children: 2  . 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 Smoking status: Former Research scientist (life sciences)  . Smokeless tobacco: Never Used     Comment: smoked for 6 years, quit 15 years ago  . Alcohol Use: No  . Drug Use: No  . Sexual Activity: Not Asked   Other Topics Concern  . None   Social History Narrative   2nd child born recently in September 2010.   Married, one child from previous marriage lives in [place]   Past Surgical History    Procedure Laterality Date  . Breast biopsy    . Cesarean section  10/29/2008    twins  . Dilitation & currettage/hystroscopy with versapoint resection N/A 07/20/2012    Procedure: DILATATION & CURETTAGE/HYSTEROSCOPY WITH VERSAPOINT RESECTION;  Surgeon: Princess Bruins, MD;  Location: [facility];  Service: Gynecology;  Laterality: N/A;  . Hysteroscopy w/d&c  07-20-12   Past Medical History  Diagnosis Date  . DEPRESSIVE DISORDER 07/26/2007  . UTI 05/11/2007  . Lump or mass in breast 11/14/2008  . 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 Cervicalgia 07/26/2007  . BACK PAIN 07/26/2007  . Palpitations 09/21/2008  . Grave's disease     hx of Grave's Disease/Hyperthyroidism  . Hx of migraines   . Anxiety 06/02/2010  . Insomnia 06/02/2010  . 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 Lumbar degenerative disc disease 01/20/2011  . Hyperlipidemia 12/17/2011   BP 136/73 mmHg  Pulse 76  SpO2 98%  Opioid Risk Score:   Fall Risk Score:  `1  Depression screen PHQ 2/9  Depression screen [facility] 2/9 09/19/2014 05/01/2014 05/01/2014  Decreased Interest 1 1 1   Down, Depressed, Hopeless 1 1 1   PHQ - 2 Score 2 2 2   Altered sleeping - 2 2  Tired, decreased energy - 2 2  Change in appetite - 0 0  Feeling bad or failure about yourself  - 1 1  Trouble concentrating - 0 0  Moving slowly or fidgety/restless - 0 0  Suicidal thoughts - 0 0  PHQ-9 Score - 7 7     Review of Systems  Neurological:       Tingling  All other systems reviewed and are negative.      Objective:   Physical Exam  Constitutional: She is oriented to person, place, and time. She appears well-developed and well-nourished.  HENT:  Head: Normocephalic and atraumatic.  Neck: Normal range of motion. Neck supple.  Cardiovascular: Normal rate and regular rhythm.   Pulmonary/Chest: Effort normal and breath sounds normal.  Musculoskeletal:  Normal Muscle Bulk and Muscle Testing Reveals: Upper Extremities: Full ROM and  Muscle Strength 5/5 Lower Extremities: Full ROM and Muscle Strength 5/5 Arises from chair with ease Narrow Based gait  Neurological: She is alert and oriented to person, place, and time.  Skin: Skin is warm and dry.  Psychiatric: She has a normal mood and affect.  Nursing note and vitals reviewed.         Assessment & Plan:  1. Lumbar spondylosis: Had medial branch block in the past. Will continue with Exercise and Heat Regimen at this time. 2. Cervical spondylosis with cervical headaches.  Refilled: Hydrocodone 7.5/325 mg one tablet every 6 hours as needed. #120 3. Headaches question occipital neuralgia vs. migraine vs. combination. Neurology Following  20 minutes of face to face patient care time was spent. All questions were encouraged and answered.  F/U in 1 month

## 2014-09-21 ENCOUNTER — Ambulatory Visit: Payer: BC Managed Care – PPO | Admitting: Registered Nurse

## 2014-09-21 ENCOUNTER — Ambulatory Visit: Payer: BC Managed Care – PPO

## 2014-09-21 ENCOUNTER — Ambulatory Visit: Payer: BC Managed Care – PPO | Admitting: Physical Medicine & Rehabilitation

## 2014-09-21 LAB — PMP ALCOHOL METABOLITE (ETG): ETGU: NEGATIVE ng/mL

## 2014-09-23 LAB — OPIATES/OPIOIDS (LC/MS-MS)
Codeine Urine: NEGATIVE ng/mL (ref <50)
HYDROCODONE: NEGATIVE ng/mL — AB (ref <50)
Hydromorphone: NEGATIVE ng/mL — AB (ref <50)
Morphine Urine: NEGATIVE ng/mL (ref <50)
NORHYDROCODONE, UR: NEGATIVE ng/mL — AB (ref <50)
NOROXYCODONE, UR: NEGATIVE ng/mL (ref <50)
Oxycodone, ur: NEGATIVE ng/mL (ref <50)
Oxymorphone: NEGATIVE ng/mL (ref <50)

## 2014-09-23 LAB — BENZODIAZEPINES (GC/LC/MS), URINE
ALPRAZOLAMU: NEGATIVE ng/mL (ref <25)
CLONAZEPAU: 410 ng/mL (ref <25)
Flurazepam metabolite (GC/LC/MS), ur confirm: NEGATIVE ng/mL (ref <50)
Lorazepam (GC/LC/MS), ur confirm: NEGATIVE ng/mL (ref <50)
Midazolam (GC/LC/MS), ur confirm: NEGATIVE ng/mL (ref <50)
Nordiazepam (GC/LC/MS), ur confirm: NEGATIVE ng/mL (ref <50)
Oxazepam (GC/LC/MS), ur confirm: NEGATIVE ng/mL (ref <50)
TRIAZOLAMU: NEGATIVE ng/mL (ref <50)
Temazepam (GC/LC/MS), ur confirm: NEGATIVE ng/mL (ref <50)

## 2014-09-25 LAB — PRESCRIPTION MONITORING PROFILE (SOLSTAS)
Amphetamine/Meth: NEGATIVE ng/mL
Barbiturate Screen, Urine: NEGATIVE ng/mL
Buprenorphine, Urine: NEGATIVE ng/mL
CREATININE, URINE: 117.75 mg/dL (ref >20.0)
Cannabinoid Scrn, Ur: NEGATIVE ng/mL
Carisoprodol, Urine: NEGATIVE ng/mL
Cocaine Metabolites: NEGATIVE ng/mL
Fentanyl, Ur: NEGATIVE ng/mL
MDMA URINE: NEGATIVE ng/mL
MEPERIDINE UR: NEGATIVE ng/mL
METHADONE SCREEN, URINE: NEGATIVE ng/mL
Nitrites, Initial: NEGATIVE ug/mL
OXYCODONE SCRN UR: NEGATIVE ng/mL
PH URINE, INITIAL: 6.4 pH (ref 4.5–8.9)
Propoxyphene: NEGATIVE ng/mL
Tapentadol, urine: NEGATIVE ng/mL
Tramadol Scrn, Ur: NEGATIVE ng/mL
Zolpidem, Urine: NEGATIVE ng/mL

## 2014-09-26 ENCOUNTER — Telehealth: Payer: Self-pay | Admitting: Internal Medicine

## 2014-09-26 NOTE — Telephone Encounter (Signed)
Left msg on Cassandra Gallagher's vm

## 2014-09-26 NOTE — Telephone Encounter (Signed)
Cassandra Gallagher from [facility] which is [facility] called. Pt had an appointment with a Tawni Levy on 05/29/2014 and he is looking for an authorization for this appointment from Dr. Jenny Reichmann.  I'm not sure if this should've gone to you His direct number is [phone] Acct# [id number]

## 2014-09-26 NOTE — Telephone Encounter (Signed)
This may be a billing issue

## 2014-09-28 NOTE — Progress Notes (Signed)
Urine drug screen for this encounter is consistent for prescribed medication 

## 2014-10-11 ENCOUNTER — Other Ambulatory Visit: Payer: Self-pay | Admitting: Internal Medicine

## 2014-10-16 ENCOUNTER — Ambulatory Visit: Payer: BC Managed Care – PPO | Admitting: Physical Medicine & Rehabilitation

## 2014-10-18 ENCOUNTER — Encounter: Payer: Self-pay | Admitting: Physical Medicine & Rehabilitation

## 2014-10-18 ENCOUNTER — Encounter: Payer: BC Managed Care – PPO | Attending: Physical Medicine and Rehabilitation

## 2014-10-18 ENCOUNTER — Ambulatory Visit (HOSPITAL_BASED_OUTPATIENT_CLINIC_OR_DEPARTMENT_OTHER): Payer: BC Managed Care – PPO | Admitting: Physical Medicine & Rehabilitation

## 2014-10-18 VITALS — BP 127/82 | HR 70

## 2014-10-18 DIAGNOSIS — IMO0002 Reserved for concepts with insufficient information to code with codable children: Secondary | ICD-10-CM

## 2014-10-18 DIAGNOSIS — F419 Anxiety disorder, unspecified: Secondary | ICD-10-CM | POA: Diagnosis not present

## 2014-10-18 DIAGNOSIS — G43709 Chronic migraine without aura, not intractable, without status migrainosus: Secondary | ICD-10-CM | POA: Diagnosis not present

## 2014-10-18 DIAGNOSIS — M47817 Spondylosis without myelopathy or radiculopathy, lumbosacral region: Secondary | ICD-10-CM | POA: Insufficient documentation

## 2014-10-18 DIAGNOSIS — M47812 Spondylosis without myelopathy or radiculopathy, cervical region: Secondary | ICD-10-CM | POA: Diagnosis not present

## 2014-10-18 DIAGNOSIS — M5481 Occipital neuralgia: Secondary | ICD-10-CM | POA: Diagnosis not present

## 2014-10-18 DIAGNOSIS — M5136 Other intervertebral disc degeneration, lumbar region: Secondary | ICD-10-CM | POA: Diagnosis not present

## 2014-10-18 DIAGNOSIS — G8929 Other chronic pain: Secondary | ICD-10-CM | POA: Insufficient documentation

## 2014-10-18 MED ORDER — DULOXETINE HCL 20 MG PO CPEP: 20.0000 mg | ORAL_CAPSULE | Freq: Every day | ORAL | Status: DC

## 2014-10-18 MED ORDER — HYDROCODONE-ACETAMINOPHEN 7.5-325 MG PO TABS: 1.0000 | ORAL_TABLET | Freq: Four times a day (QID) | ORAL | Status: DC | PRN

## 2014-10-18 NOTE — Patient Instructions (Signed)
Duloxetine delayed-release capsules What is this medicine? DULOXETINE (doo LOX e teen) is used to treat depression, anxiety, and different types of chronic pain. This medicine may be used for other purposes; ask your health care provider or pharmacist if you have questions. COMMON BRAND NAME(S): Cymbalta What should I tell my health care provider before I take this medicine? They need to know if you have any of these conditions: -bipolar disorder or a family history of bipolar disorder -glaucoma -kidney disease -liver disease -suicidal thoughts or a previous suicide attempt -taken medicines called MAOIs like Carbex, Eldepryl, Marplan, Nardil, and Parnate within 14 days -an unusual reaction to duloxetine, other medicines, foods, dyes, or preservatives -pregnant or trying to get pregnant -breast-feeding How should I use this medicine? Take this medicine by mouth with a glass of water. Follow the directions on the prescription label. Do not cut, crush or chew this medicine. You can take this medicine with or without food. Take your medicine at regular intervals. Do not take your medicine more often than directed. Do not stop taking this medicine suddenly except upon the advice of your doctor. Stopping this medicine too quickly may cause serious side effects or your condition may worsen. A special MedGuide will be given to you by the pharmacist with each prescription and refill. Be sure to read this information carefully each time. Talk to your pediatrician regarding the use of this medicine in children. While this drug may be prescribed for children as young as 7 years of age for selected conditions, precautions do apply. Overdosage: If you think you have taken too much of this medicine contact a poison control center or emergency room at once. NOTE: This medicine is only for you. Do not share this medicine with others. What if I miss a dose? If you miss a dose, take it as soon as you can. If it  is almost time for your next dose, take only that dose. Do not take double or extra doses. What may interact with this medicine? Do not take this medicine with any of the following medications: -certain diet drugs like dexfenfluramine, fenfluramine -desvenlafaxine -linezolid -MAOIs like Azilect, Carbex, Eldepryl, Marplan, Nardil, and Parnate -methylene blue (intravenous) -milnacipran -thioridazine -venlafaxine This medicine may also interact with the following medications: -alcohol -aspirin and aspirin-like medicines -certain antibiotics like ciprofloxacin and enoxacin -certain medicines for blood pressure, heart disease, irregular heart beat -certain medicines for depression, anxiety, or psychotic disturbances -certain medicines for migraine headache like almotriptan, eletriptan, frovatriptan, naratriptan, rizatriptan, sumatriptan, zolmitriptan -certain medicines that treat or prevent blood clots like warfarin, enoxaparin, and dalteparin -cimetidine -fentanyl -lithium -NSAIDS, medicines for pain and inflammation, like ibuprofen or naproxen -phentermine -procarbazine -sibutramine -St. John's wort -theophylline -tramadol -tryptophan This list may not describe all possible interactions. Give your health care provider a list of all the medicines, herbs, non-prescription drugs, or dietary supplements you use. Also tell them if you smoke, drink alcohol, or use illegal drugs. Some items may interact with your medicine. What should I watch for while using this medicine? Tell your doctor if your symptoms do not get better or if they get worse. Visit your doctor or health care professional for regular checks on your progress. Because it may take several weeks to see the full effects of this medicine, it is important to continue your treatment as prescribed by your doctor. Patients and their families should watch out for new or worsening thoughts of suicide or depression. Also watch out for  sudden changes in   feelings such as feeling anxious, agitated, panicky, irritable, hostile, aggressive, impulsive, severely restless, overly excited and hyperactive, or not being able to sleep. If this happens, especially at the beginning of treatment or after a change in dose, call your health care professional. You may get drowsy or dizzy. Do not drive, use machinery, or do anything that needs mental alertness until you know how this medicine affects you. Do not stand or sit up quickly, especially if you are an older patient. This reduces the risk of dizzy or fainting spells. Alcohol may interfere with the effect of this medicine. Avoid alcoholic drinks. This medicine can cause an increase in blood pressure. This medicine can also cause a sudden drop in your blood pressure, which may make you feel faint and increase the chance of a fall. These effects are most common when you first start the medicine or when the dose is increased, or during use of other medicines that can cause a sudden drop in blood pressure. Check with your doctor for instructions on monitoring your blood pressure while taking this medicine. Your mouth may get dry. Chewing sugarless gum or sucking hard candy, and drinking plenty of water may help. Contact your doctor if the problem does not go away or is severe. What side effects may I notice from receiving this medicine? Side effects that you should report to your doctor or health care professional as soon as possible: -allergic reactions like skin rash, itching or hives, swelling of the face, lips, or tongue -changes in blood pressure -confusion -dark urine -dizziness -fast talking and excited feelings or actions that are out of control -fast, irregular heartbeat -fever -general ill feeling or flu-like symptoms -hallucination, loss of contact with reality -light-colored stools -loss of balance or coordination -redness, blistering, peeling or loosening of the skin, including  inside the mouth -right upper belly pain -seizures -suicidal thoughts or other mood changes -trouble concentrating -trouble passing urine or change in the amount of urine -unusual bleeding or bruising -unusually weak or tired -yellowing of the eyes or skin Side effects that usually do not require medical attention (report to your doctor or health care professional if they continue or are bothersome): -blurred vision -change in appetite -change in sex drive or performance -headache -increased sweating -nausea This list may not describe all possible side effects. Call your doctor for medical advice about side effects. You may report side effects to FDA at 1-800-FDA-1088. Where should I keep my medicine? Keep out of the reach of children. Store at room temperature between 15 and 30 degrees C (59 and 86 degrees F). Throw away any unused medicine after the expiration date. NOTE: This sheet is a summary. It may not cover all possible information. If you have questions about this medicine, talk to your doctor, pharmacist, or health care provider.  2015, Elsevier/Gold Standard. (2013-01-17 14:20:31)  

## 2014-10-18 NOTE — Progress Notes (Signed)
Subjective:    Patient ID: Cassandra Gallagher, female    DOB: [birth year], 54 y.o.   MRN: [id number]  HPI  Cassandra Gallagher has an emg report from her neurologist Dr Trula Ore.  She is experiencing eye pain and he suggested she stop the lyrica.  She tried stopping and pain decreased or went away and when she took med again and it returned so she has stopped this completely. Pt states there was blurring of vision.  Saw opthalmologist  Who diagnosed with dry eyes and mild cataract.   Stopped Lyrica ~ wk ago, eyes ok.   He suggested amitriptyline and Cymbalta as alternatives.  She has had some side effects of nortriptyline and is a little concerned about taking amitriptyline.  Therefore, she is considering Cymbalta and would like to discuss this with Dr Letta Pate. She has also had occipital blocks x2 with her neurologist and something appears to be working though between Botox and the blocks she is uncertain which is helping more.  EMG report reviewed from 08/22/2014. Evidence of denervation in right and left C5 C6 C7 paraspinal muscles. Of note is the patient has been getting Botox injections which included paraspinal muscle injections.  Also of median neuropathy sensory mild prolongation on the left. No symptoms in the first or middle digits of the left hand. There are some intermittent paresthesias in the left fifth digit. Of note is the ulnar nerve study on the left side was normal. Reviewed MRI and cervical spine no evidence of nerve root compression, performed in 2014.  Pain Inventory Average Pain 6 Pain Right Now 6 My pain is intermittent, burning, dull, tingling and aching  In the last 24 hours, has pain interfered with the following? General activity 6 Relation with others 6 Enjoyment of life 6 What TIME of day is your pain at its worst? varies Sleep (in general) Poor  Pain is worse with: some activites Pain improves with: heat/ice, therapy/exercise, medication, TENS and injections Relief from  Meds: 8  Mobility walk without assistance ability to climb steps?  yes do you drive?  yes  Function employed # of hrs/week 30  Neuro/Psych weakness  Prior Studies Any changes since last visit?  no  Physicians involved in your care Any changes since last visit?  no   Family History  Problem Relation Age of Onset  . Hypertension Mother    Social History   Social History  . Marital Status: Significant Other    Spouse Name: N/A  . Number of Children: 2  . 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 Smoking status: Former Research scientist (life sciences)  . Smokeless tobacco: Never Used     Comment: smoked for 6 years, quit 15 years ago  . Alcohol Use: No  . Drug Use: No  . Sexual Activity: Not Asked   Other Topics Concern  . None   Social History Narrative   2nd child born recently in September 2010.   Married, one child from previous marriage lives in [place]   Past Surgical History  Procedure Laterality Date  . Breast biopsy    . Cesarean section  10/29/2008    twins  . Dilitation & currettage/hystroscopy with versapoint resection N/A 07/20/2012    Procedure: DILATATION & CURETTAGE/HYSTEROSCOPY WITH VERSAPOINT RESECTION;  Surgeon: Princess Bruins, MD;  Location: [facility];  Service: Gynecology;  Laterality: N/A;  . Hysteroscopy w/d&c  07-20-12   Past Medical History  Diagnosis Date  . DEPRESSIVE DISORDER 07/26/2007  . UTI 05/11/2007  . Lump or mass in breast 11/14/2008  . 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 Cervicalgia 07/26/2007  . BACK PAIN 07/26/2007  . Palpitations 09/21/2008  . Grave's disease     hx of Grave's Disease/Hyperthyroidism  . Hx of migraines   . Anxiety 06/02/2010  . Insomnia 06/02/2010  . 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 Lumbar degenerative disc disease 01/20/2011  . Hyperlipidemia 12/17/2011   BP 127/82 mmHg  Pulse 70  SpO2 96%  Opioid Risk Score:   Fall Risk Score:   `1  Depression screen PHQ 2/9  Depression screen [facility] 2/9 10/18/2014 09/19/2014 05/01/2014 05/01/2014  Decreased Interest 1 1 1 1   Down, Depressed, Hopeless 1 1 1 1   PHQ - 2 Score 2 2 2 2   Altered sleeping - - 2 2  Tired, decreased energy - - 2 2  Change in appetite - - 0 0  Feeling bad or failure about yourself  - - 1 1  Trouble concentrating - - 0 0  Moving slowly or fidgety/restless - - 0 0  Suicidal thoughts - - 0 0  PHQ-9 Score - - 7 7     Review of Systems  Neurological: Positive for weakness and headaches.  All other systems reviewed and are negative.      Objective:   Physical Exam  Constitutional: She is oriented to person, place, and time. She appears well-developed and well-nourished.  HENT:  Head: Normocephalic and atraumatic.  Right Ear: External ear normal.  Left Ear: External ear normal.  Eyes: Conjunctivae and EOM are normal. Pupils are equal, round, and reactive to light.  Neck: Normal range of motion. Neck supple.  Musculoskeletal:       Right shoulder: Normal.       Left shoulder: Normal.       Right elbow: Normal.      Left elbow: Normal.       Right wrist: Normal.       Left wrist: Normal.  Neurological: She is alert and oriented to person, place, and time. She has normal strength. No sensory deficit. Coordination normal.  Reflex Scores:      Tricep reflexes are 2+ on the right side and 2+ on the left side.      Bicep reflexes are 2+ on the right side and 2+ on the left side.      Brachioradialis reflexes are 2+ on the right side and 2+ on the left side.      Patellar reflexes are 2+ on the right side and 2+ on the left side.      Achilles reflexes are 2+ on the right side and 2+ on the left side. Psychiatric: She has a normal mood and affect.  Nursing note and vitals reviewed.         Assessment & Plan:  1. Chronic headaches combination of occipital neuralgia and chronic migraine. Mild cervical spondylosis and degenerative disc may  contribute  Reviewed EMG findings Reviewed MRI cervical spine 2014 Reviewed examination  Do not think patient has cervical radiculopathy. I believe the  paraspinal muscle findings of denervation potentials are related to the Botox injections in the cervical Paraspinal muscles.  We discussed at length Lyrica versus amitriptyline versus Cymbalta. She has decided not to retry the Lyrica even at small doses. She would like to try the Cymbalta. We discussed that she is quite sensitive to even small dose of medications in  terms of side effects. We'll start with Cymbalta 20 mg per day. She will follow-up with her neurologist may want to bump this up to 30 mg, I informed the patient of most common side effects such as nausea, gave her a printout of all the side effects. We also discussed that this medication will need to be taken for a month before the full effect is known. Would not increase dosing any sooner than 4 weeks.  PA visit one month  This was all discussed with patient and her husband Over half of the 25 min visit was spent counseling and coordinating care.

## 2014-10-29 ENCOUNTER — Other Ambulatory Visit: Payer: Self-pay | Admitting: Internal Medicine

## 2014-10-30 ENCOUNTER — Encounter: Payer: Self-pay | Admitting: Internal Medicine

## 2014-10-30 NOTE — Telephone Encounter (Signed)
Done hardcopy to Dahlia  

## 2014-10-30 NOTE — Telephone Encounter (Signed)
Faxed script back to [facility]...lmb 

## 2014-11-14 ENCOUNTER — Encounter
Payer: BC Managed Care – PPO | Attending: Physical Medicine and Rehabilitation | Admitting: Physical Medicine & Rehabilitation

## 2014-11-14 ENCOUNTER — Encounter: Payer: Self-pay | Admitting: Physical Medicine & Rehabilitation

## 2014-11-14 VITALS — BP 116/61 | HR 82

## 2014-11-14 DIAGNOSIS — G8929 Other chronic pain: Secondary | ICD-10-CM | POA: Insufficient documentation

## 2014-11-14 DIAGNOSIS — F419 Anxiety disorder, unspecified: Secondary | ICD-10-CM | POA: Insufficient documentation

## 2014-11-14 DIAGNOSIS — M5136 Other intervertebral disc degeneration, lumbar region: Secondary | ICD-10-CM | POA: Diagnosis not present

## 2014-11-14 DIAGNOSIS — M47817 Spondylosis without myelopathy or radiculopathy, lumbosacral region: Secondary | ICD-10-CM | POA: Diagnosis present

## 2014-11-14 DIAGNOSIS — IMO0002 Reserved for concepts with insufficient information to code with codable children: Secondary | ICD-10-CM

## 2014-11-14 DIAGNOSIS — M5481 Occipital neuralgia: Secondary | ICD-10-CM | POA: Insufficient documentation

## 2014-11-14 DIAGNOSIS — G43709 Chronic migraine without aura, not intractable, without status migrainosus: Secondary | ICD-10-CM

## 2014-11-14 MED ORDER — HYDROCODONE-ACETAMINOPHEN 7.5-325 MG PO TABS: 1.0000 | ORAL_TABLET | Freq: Four times a day (QID) | ORAL | Status: DC | PRN

## 2014-11-14 MED ORDER — KETOROLAC TROMETHAMINE 60 MG/2ML IM SOLN
60.0000 mg | Freq: Once | INTRAMUSCULAR | Status: AC
  Administered 2014-11-14: 60 mg via INTRAMUSCULAR

## 2014-11-14 NOTE — Progress Notes (Signed)
Botox Injection for chronic migraine headaches ICD 10: G43.709  Dilution: 100 Units/ 102ml preservative free NS Indication: refractory headaches incompletely responsive to other more conservative measures.  Informed consent was obtained after describing risks and benefits of the procedure with the patient. This includes bleeding, bruising, infection, excessive weakness, or medication side effects. A REMS form is on file and signed. Needle: 27g 1/2 inch needle   Number of units per muscle:  Right temporalis 20 units, 4 access points Left temporalis 20 units,  4 access points Right frontalis 10 units, 2 access points Left frontalis 10 units, 2 access points Procerus 5 units, 1 access point Right corrugator 5 units, 1 access point Left corrugator 5 units, 1 access point Right occipitalis 15 units, 3 access points Left occipitalis 15 units, 3 access points Right cervical paraspinal 10 units, 2 access points Left cervical paraspinals 10 units, 2 access points Right cervical paraspinal 10 units, 2 access points Right trapezius 15 units, 3 access points Left trapezius 15 units, 3 access points     All injections were done after  after negative drawback for blood. The patient tolerated the procedure well. Post procedure instructions were given. A followup appointment was made.  Additionally, the patient was given 60mg  IM toradol for acute pain relief. She has had a difficult month, starting when she tried cymbalta which didn't agree with her. Medication administered by RN.

## 2014-11-14 NOTE — Addendum Note (Signed)
Addended by: Caro Hight on: 11/14/2014 10:51 AM   Modules accepted: Orders

## 2014-11-14 NOTE — Patient Instructions (Signed)
PLEASE CALL ME WITH ANY PROBLEMS OR QUESTIONS (#[phone]).  HAVE A GOOD DAY!    

## 2014-11-30 ENCOUNTER — Encounter: Payer: Self-pay | Admitting: Registered Nurse

## 2014-12-06 ENCOUNTER — Encounter: Payer: Self-pay | Admitting: Physical Medicine & Rehabilitation

## 2014-12-13 ENCOUNTER — Encounter: Payer: BC Managed Care – PPO | Attending: Physical Medicine and Rehabilitation

## 2014-12-13 ENCOUNTER — Ambulatory Visit (HOSPITAL_BASED_OUTPATIENT_CLINIC_OR_DEPARTMENT_OTHER): Payer: BC Managed Care – PPO | Admitting: Physical Medicine & Rehabilitation

## 2014-12-13 ENCOUNTER — Encounter: Payer: Self-pay | Admitting: Physical Medicine & Rehabilitation

## 2014-12-13 VITALS — BP 136/83 | HR 70 | Resp 15

## 2014-12-13 DIAGNOSIS — G43709 Chronic migraine without aura, not intractable, without status migrainosus: Secondary | ICD-10-CM | POA: Diagnosis not present

## 2014-12-13 DIAGNOSIS — M5481 Occipital neuralgia: Secondary | ICD-10-CM

## 2014-12-13 DIAGNOSIS — M5136 Other intervertebral disc degeneration, lumbar region: Secondary | ICD-10-CM | POA: Insufficient documentation

## 2014-12-13 DIAGNOSIS — IMO0002 Reserved for concepts with insufficient information to code with codable children: Secondary | ICD-10-CM

## 2014-12-13 DIAGNOSIS — F419 Anxiety disorder, unspecified: Secondary | ICD-10-CM | POA: Insufficient documentation

## 2014-12-13 DIAGNOSIS — G8929 Other chronic pain: Secondary | ICD-10-CM | POA: Insufficient documentation

## 2014-12-13 DIAGNOSIS — M47817 Spondylosis without myelopathy or radiculopathy, lumbosacral region: Secondary | ICD-10-CM | POA: Insufficient documentation

## 2014-12-13 DIAGNOSIS — M47812 Spondylosis without myelopathy or radiculopathy, cervical region: Secondary | ICD-10-CM | POA: Diagnosis not present

## 2014-12-13 NOTE — Progress Notes (Signed)
Subjective:    Patient ID: Cassandra Gallagher, female    DOB: [birth year], 54 y.o.   MRN: [id number]  HPI 54 year old female with history of chronic headaches as well as neck pain. In addition she has chronic low back pain. She's had some good relief with her headaches using botulinum toxin. Is seeing Dr. Eda Gallagher for this. She also sees a neurologist who does occipital nerve blocks. The first 2 were not helpful but the last one has been helpful for about a week so far. In terms of her low back she has not had medial branch blocks for quite a while. She has been noticing some increased back soreness but has not been exercising very much recently. Her medication management will be through her neurology office now. She wanted to make sure she can come back for back injections if need be.  Pain Inventory Average Pain 3 Pain Right Now 0 My pain is burning and tingling  In the last 24 hours, has pain interfered with the following? General activity 1 Relation with others 1 Enjoyment of life 1 What TIME of day is your pain at its worst? morning, evening and night Sleep (in general) Good  Pain is worse with: inactivity Pain improves with: heat/ice, therapy/exercise, medication, TENS and injections Relief from Meds: 8  Mobility do you drive?  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  yes  Physicians involved in your care Any changes since last visit?  yes   Family History  Problem Relation Age of Onset  . Hypertension Mother    Social History   Social History  . Marital Status: Significant Other    Spouse Name: N/A  . Number of Children: 2  . 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 Smoking status: Former Research scientist (life sciences)  . Smokeless tobacco: Never Used     Comment: smoked for 6 years, quit 15 years ago  .  Alcohol Use: No  . Drug Use: No  . Sexual Activity: Not Asked   Other Topics Concern  . None   Social History Narrative   2nd child born recently in September 2010.   Married, one child from previous marriage lives in [place]   Past Surgical History  Procedure Laterality Date  . Breast biopsy    . Cesarean section  10/29/2008    twins  . Dilitation & currettage/hystroscopy with versapoint resection N/A 07/20/2012    Procedure: DILATATION & CURETTAGE/HYSTEROSCOPY WITH VERSAPOINT RESECTION;  Surgeon: Princess Bruins, MD;  Location: [facility];  Service: Gynecology;  Laterality: N/A;  . Hysteroscopy w/d&c  07-20-12   Past Medical History  Diagnosis Date  . DEPRESSIVE DISORDER 07/26/2007  . UTI 05/11/2007  . Lump or mass in breast 11/14/2008  . 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 Cervicalgia 07/26/2007  . BACK PAIN 07/26/2007  . Palpitations 09/21/2008  . Grave's disease     hx of Grave's Disease/Hyperthyroidism  . Hx of migraines   . Anxiety 06/02/2010  . Insomnia 06/02/2010  . 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 Lumbar degenerative disc disease 01/20/2011  . Hyperlipidemia 12/17/2011   BP 136/83 mmHg  Pulse 70  Resp 15  SpO2 95%  Opioid Risk Score:   Fall Risk Score:  `1  Depression screen PHQ 2/9  Depression screen PHQ  2/9 10/18/2014 09/19/2014 05/01/2014 05/01/2014  Decreased Interest 1 1 1 1   Down, Depressed, Hopeless 1 1 1 1   PHQ - 2 Score 2 2 2 2   Altered sleeping - - 2 2  Tired, decreased energy - - 2 2  Change in appetite - - 0 0  Feeling bad or failure about yourself  - - 1 1  Trouble concentrating - - 0 0  Moving slowly or fidgety/restless - - 0 0  Suicidal thoughts - - 0 0  PHQ-9 Score - - 7 7     Review of Systems  All other systems reviewed and are negative.      Objective:   Physical Exam  Constitutional: She is oriented to person, place, and time. She appears well-developed and well-nourished.  HENT:  Head: Normocephalic and  atraumatic.  Eyes: Conjunctivae and EOM are normal. Pupils are equal, round, and reactive to light.  Neck: Normal range of motion.  Musculoskeletal:       Cervical back: She exhibits normal range of motion, no tenderness and no deformity.       Thoracic back: She exhibits normal range of motion, no tenderness and no deformity.       Lumbar back: She exhibits normal range of motion, no tenderness and no deformity.  Neurological: She is alert and oriented to person, place, and time.  Psychiatric: She has a normal mood and affect.  Nursing note and vitals reviewed.         Assessment & Plan:  1. Cervical spondylosis has benefited from cervical medial branch blocks in the past if her right-sided neck pain persists may consider repeat  2. Lumbar pain, lumbar spondylosis overall doing better has not required any injections for over a year. Patient will resume her normal exercise program but if this is not helpful in relieving some of her pain she may schedule another lumbar medial branch block  3. Chronic migraines Botox injections per Dr. Eda Gallagher every 3 months  4. Occipital neuralgia follow-up with neurology  5. Chronic pain syndrome hydrocodone, opioids per neurology. We'll no longer prescribe

## 2014-12-30 ENCOUNTER — Encounter: Payer: Self-pay | Admitting: Internal Medicine

## 2015-01-01 ENCOUNTER — Ambulatory Visit (INDEPENDENT_AMBULATORY_CARE_PROVIDER_SITE_OTHER): Payer: BC Managed Care – PPO | Admitting: Internal Medicine

## 2015-01-01 ENCOUNTER — Encounter: Payer: Self-pay | Admitting: Internal Medicine

## 2015-01-01 VITALS — BP 104/76 | HR 86 | Temp 97.8°F | Ht 65.0 in | Wt 155.0 lb

## 2015-01-01 DIAGNOSIS — J209 Acute bronchitis, unspecified: Secondary | ICD-10-CM

## 2015-01-01 MED ORDER — HYDROCODONE-HOMATROPINE 5-1.5 MG/5ML PO SYRP: 5.0000 mL | ORAL_SOLUTION | Freq: Four times a day (QID) | ORAL | Status: DC | PRN

## 2015-01-01 MED ORDER — AZITHROMYCIN 250 MG PO TABS: ORAL_TABLET | ORAL | Status: DC

## 2015-01-01 NOTE — Progress Notes (Signed)
Pre visit review using our clinic review tool, if applicable. No additional management support is needed unless otherwise documented below in the visit note. 

## 2015-01-01 NOTE — Patient Instructions (Signed)
Please take all new medication as prescribed - the antibiotic, and cough medicine  Please continue all other medications as before, and refills have been done if requested.  Please have the pharmacy call with any other refills you may need.  Please keep your appointments with your specialists as you may have planned      

## 2015-01-02 NOTE — Assessment & Plan Note (Signed)
Mild to mod, for antibx course,  to f/u any worsening symptoms or concerns 

## 2015-01-02 NOTE — Progress Notes (Signed)
Subjective:    Patient ID: Cassandra Gallagher, female    DOB: Mar 25, 1960, 54 y.o.   MRN: [id number]  HPI  Here with acute onset mild to mod 2-3 days ST, HA, general weakness and malaise, with prod cough greenish sputum, but Pt denies chest pain, increased sob or doe, wheezing, orthopnea, PND, increased LE swelling, palpitations, dizziness or syncope.  Denies worsening depressive symptoms, suicidal ideation, or panic Past Medical History  Diagnosis Date  . DEPRESSIVE DISORDER 07/26/2007  . UTI 05/11/2007  . Lump or mass in breast 11/14/2008  . 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 Cervicalgia 07/26/2007  . BACK PAIN 07/26/2007  . Palpitations 09/21/2008  . Grave's disease     hx of Grave's Disease/Hyperthyroidism  . Hx of migraines   . Anxiety 06/02/2010  . Insomnia 06/02/2010  . 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 Lumbar degenerative disc disease 01/20/2011  . Hyperlipidemia 12/17/2011   Past Surgical History  Procedure Laterality Date  . Breast biopsy    . Cesarean section  10/29/2008    twins  . Dilitation & currettage/hystroscopy with versapoint resection N/A 07/20/2012    Procedure: DILATATION & CURETTAGE/HYSTEROSCOPY WITH VERSAPOINT RESECTION;  Surgeon: Princess Bruins, MD;  Location: [facility];  Service: Gynecology;  Laterality: N/A;  . Hysteroscopy w/d&c  07-20-12    reports that she has quit smoking. She has never used smokeless tobacco. She reports that she does not drink alcohol or use illicit drugs. family history includes Hypertension in her mother. No Known Allergies Current Outpatient Prescriptions on File Prior to Visit  Medication Sig Dispense Refill  . atorvastatin (LIPITOR) 10 MG tablet Take 1 tablet (10 mg total) by mouth daily. 90 tablet 3  . Calcium 500 MG tablet Take 1,000 mg by mouth daily.    . clonazePAM (KLONOPIN) 0.5 MG tablet take 1 tablet by mouth twice a day if needed 60 tablet 3  . COMBIPATCH 0.05-0.14 MG/DAY Place 1 patch onto the skin  Every third day.    . DULoxetine (CYMBALTA) 20 MG capsule Take 1 capsule (20 mg total) by mouth daily. 30 capsule 1  . HYDROcodone-acetaminophen (NORCO) 7.5-325 MG tablet Take 1 tablet by mouth every 6 (six) hours as needed for moderate pain. 120 tablet 0  . Multiple Vitamin (MULTIVITAMIN WITH MINERALS) TABS Take 1 tablet by mouth daily.    . Naproxen Sodium (NAPRELAN) 375 MG TB24 Take 1 tablet by mouth 2 (two) times daily as needed (for pain).     . rizatriptan (MAXALT-MLT) 10 MG disintegrating tablet place 1 tablet ON THE TONGUE UNTIL DISSOLVED.   if needed for migraines may repeat after 2 hours if needed 10 tablet 0  . pregabalin (LYRICA) 50 MG capsule Take 1 capsule (50 mg total) by mouth 3 (three) times daily. (Patient not taking: Reported on 12/13/2014) 90 capsule 0  . rizatriptan (MAXALT) 10 MG tablet Take 1 tablet (10 mg total) by mouth as needed for migraine. May repeat in 2 hours if needed (Patient not taking: Reported on 01/01/2015) 10 tablet 5  . topiramate (TOPAMAX) 25 MG tablet Take 100 mg by mouth at bedtime.     No current facility-administered medications on file prior to visit.   Review of Systems All otherwise neg per pt     Objective:   Physical Exam BP 104/76 mmHg  Pulse 86  Temp(Src) 97.8 F (36.6 C) (Oral)  Ht 5\' 5"  (1.651 m)  Wt 155 lb (70.308 kg)  BMI 25.79 kg/m2  SpO2 97% VS noted, mild ill  Constitutional: Pt appears in no significant distress HENT: Head: NCAT.  Right Ear: External ear normal.  Left Ear: External ear normal.  Bilat tm's with mild erythema.  Max sinus areas non tender.  Pharynx with mild erythema, no exudate Eyes: . Pupils are equal, round, and reactive to light. Conjunctivae and EOM are normal Neck: Normal range of motion. Neck supple.  Cardiovascular: Normal rate and regular rhythm.   Pulmonary/Chest: Effort normal and breath sounds without rales or wheezing.  Neurological: Pt is alert. Not confused , motor grossly intact Skin: Skin is  warm. No rash, no LE edema Psychiatric: Pt behavior is normal. No agitation.     Assessment & Plan:

## 2015-02-13 ENCOUNTER — Encounter
Payer: BC Managed Care – PPO | Attending: Physical Medicine and Rehabilitation | Admitting: Physical Medicine & Rehabilitation

## 2015-02-13 ENCOUNTER — Encounter: Payer: Self-pay | Admitting: Physical Medicine & Rehabilitation

## 2015-02-13 ENCOUNTER — Ambulatory Visit: Payer: BC Managed Care – PPO | Admitting: Physical Medicine & Rehabilitation

## 2015-02-13 VITALS — BP 126/78 | HR 63 | Resp 14

## 2015-02-13 DIAGNOSIS — M5481 Occipital neuralgia: Secondary | ICD-10-CM | POA: Diagnosis not present

## 2015-02-13 DIAGNOSIS — G8929 Other chronic pain: Secondary | ICD-10-CM | POA: Diagnosis not present

## 2015-02-13 DIAGNOSIS — M5136 Other intervertebral disc degeneration, lumbar region: Secondary | ICD-10-CM | POA: Diagnosis not present

## 2015-02-13 DIAGNOSIS — M47817 Spondylosis without myelopathy or radiculopathy, lumbosacral region: Secondary | ICD-10-CM | POA: Insufficient documentation

## 2015-02-13 DIAGNOSIS — G43709 Chronic migraine without aura, not intractable, without status migrainosus: Secondary | ICD-10-CM

## 2015-02-13 DIAGNOSIS — F419 Anxiety disorder, unspecified: Secondary | ICD-10-CM | POA: Insufficient documentation

## 2015-02-13 DIAGNOSIS — IMO0002 Reserved for concepts with insufficient information to code with codable children: Secondary | ICD-10-CM

## 2015-02-13 NOTE — Progress Notes (Signed)
Botox Injection for chronic migraine headaches ICD 10: G43.709  Dilution: 100 Units/ 48ml preservative free NS Indication: refractory headaches (At least 15 days per month/headache lasting greater than 4 hours per day) incompletely responsive to other more conservative measures.  Informed consent was obtained after describing risks and benefits of the procedure with the patient. This includes bleeding, bruising, infection, excessive weakness, or medication side effects. A REMS form is on file and signed. Needle: 30g 1/2 inch needle   Number of units per muscle:  Right temporalis 10 units, 2 access points Left temporalis 10 units,  2 access points Right frontalis 10 units, 2 access points Left frontalis 10 units, 2 access points Procerus 5 units, 1 access point Right corrugator 5 units, 1 access point Left corrugator 5 units, 1 access point Right occipitalis 15 units, 3 access points Left occipitalis 15 units, 3 access points Right cervical paraspinal 10 units, 2 access points Left cervical paraspinals 10 units, 2 access points  Right trapezius 15 units, 3 access points Left trapezius 15 units, 3 access points   Remainin 45 units was injected in the 10u each occipitalis, 10 u left cervical paraspinal    All injections were done after  after negative drawback for blood. The patient tolerated the procedure well. Post procedure instructions were given. A followup appointment was made.

## 2015-02-23 ENCOUNTER — Ambulatory Visit (INDEPENDENT_AMBULATORY_CARE_PROVIDER_SITE_OTHER): Payer: BC Managed Care – PPO | Admitting: Internal Medicine

## 2015-02-23 VITALS — BP 124/82 | HR 110 | Temp 98.8°F | Resp 18 | Wt 152.0 lb

## 2015-02-23 DIAGNOSIS — R05 Cough: Secondary | ICD-10-CM | POA: Diagnosis not present

## 2015-02-23 DIAGNOSIS — R059 Cough, unspecified: Secondary | ICD-10-CM | POA: Insufficient documentation

## 2015-02-23 MED ORDER — HYDROCODONE-HOMATROPINE 5-1.5 MG/5ML PO SYRP: 5.0000 mL | ORAL_SOLUTION | Freq: Four times a day (QID) | ORAL | Status: DC | PRN

## 2015-02-23 MED ORDER — LEVOFLOXACIN 500 MG PO TABS: 500.0000 mg | ORAL_TABLET | Freq: Every day | ORAL | Status: DC

## 2015-02-23 NOTE — Progress Notes (Signed)
Subjective:    Patient ID: Cassandra Gallagher, female    DOB: 08-29-1960, 55 y.o.   MRN: [id number]  HPI  . Here with 2-3 days acute onset fever, facial pain, pressure, headache, general weakness and malaise, and greenish d/c, with mild ST and cough, but pt denies chest pain, wheezing, increased sob or doe, orthopnea, PND, increased LE swelling, palpitations, dizziness or syncope, except for now more prod cough greenish in the last day, instead of feeling better from a simple cold she's has many times, has 2 young kids at home.  Just feels bad. Past Medical History  Diagnosis Date  . DEPRESSIVE DISORDER 07/26/2007  . UTI 05/11/2007  . Lump or mass in breast 11/14/2008  . 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 Cervicalgia 07/26/2007  . BACK PAIN 07/26/2007  . Palpitations 09/21/2008  . Grave's disease     hx of Grave's Disease/Hyperthyroidism  . Hx of migraines   . Anxiety 06/02/2010  . Insomnia 06/02/2010  . 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 Lumbar degenerative disc disease 01/20/2011  . Hyperlipidemia 12/17/2011   Past Surgical History  Procedure Laterality Date  . Breast biopsy    . Cesarean section  10/29/2008    twins  . Dilitation & currettage/hystroscopy with versapoint resection N/A 07/20/2012    Procedure: DILATATION & CURETTAGE/HYSTEROSCOPY WITH VERSAPOINT RESECTION;  Surgeon: Princess Bruins, MD;  Location: [facility];  Service: Gynecology;  Laterality: N/A;  . Hysteroscopy w/d&c  07-20-12    reports that she has quit smoking. She has never used smokeless tobacco. She reports that she does not drink alcohol or use illicit drugs. family history includes Hypertension in her mother. No Known Allergies Current Outpatient Prescriptions on File Prior to Visit  Medication Sig Dispense Refill  . atorvastatin (LIPITOR) 10 MG tablet Take 1 tablet (10 mg total) by mouth daily. 90 tablet 3  . Calcium 500 MG tablet Take 1,000 mg by mouth daily.    . clonazePAM (KLONOPIN)  0.5 MG tablet take 1 tablet by mouth twice a day if needed 60 tablet 3  . COMBIPATCH 0.05-0.14 MG/DAY Place 1 patch onto the skin Every third day.    Marland Kitchen HYDROcodone-acetaminophen (NORCO) 7.5-325 MG tablet Take 1 tablet by mouth every 6 (six) hours as needed for moderate pain. 120 tablet 0  . Multiple Vitamin (MULTIVITAMIN WITH MINERALS) TABS Take 1 tablet by mouth daily.    . Naproxen Sodium (NAPRELAN) 375 MG TB24 Take 1 tablet by mouth 2 (two) times daily as needed (for pain).     . rizatriptan (MAXALT-MLT) 10 MG disintegrating tablet place 1 tablet ON THE TONGUE UNTIL DISSOLVED.   if needed for migraines may repeat after 2 hours if needed 10 tablet 0   No current facility-administered medications on file prior to visit.    Review of Systems  All otherwise neg per pt      Objective:   Physical Exam BP 124/82 mmHg  Pulse 110  Temp(Src) 98.8 F (37.1 C) (Oral)  Resp 18  Wt 152 lb (68.947 kg)  SpO2 97% VS noted, mild ill Constitutional: Pt appears in no significant distress HENT: Head: NCAT.  Right Ear: External ear normal.  Left Ear: External ear normal.  Left tm's with severe erythema., right tm mild erythema only, Max sinus areas mild tender.  Pharynx with mild erythema, no exudateEyes: . Pupils are equal, round, and reactive to light. Conjunctivae and EOM are normal Neck: Normal range of motion. Neck supple.  Cardiovascular:  Normal rate and regular rhythm.   Pulmonary/Chest: Effort normal and breath sounds without rales or wheezing. but somewhat decreased bilat Neurological: Pt is alert. Not confused , motor grossly intact Skin: Skin is warm. No rash, no LE edema Psychiatric: Pt behavior is normal. No agitation.      Assessment & Plan:

## 2015-02-23 NOTE — Assessment & Plan Note (Signed)
C/w infectious etiology , possible URI/left otitis, and/or bronchitis/pna  - cap, for levaquin asd, cough med prn, declines cxr for now but would consider if worsens

## 2015-02-23 NOTE — Patient Instructions (Signed)
Please take all new medication as prescribed -  The antibiotic, and cough medicine if needed  Please continue all other medications as before, and refills have been done if requested.  Please have the pharmacy call with any other refills you may need.  Please keep your appointments with your specialists as you may have planned

## 2015-02-23 NOTE — Progress Notes (Signed)
Pre visit review using our clinic review tool, if applicable. No additional management support is needed unless otherwise documented below in the visit note. 

## 2015-03-01 ENCOUNTER — Ambulatory Visit (INDEPENDENT_AMBULATORY_CARE_PROVIDER_SITE_OTHER): Payer: BC Managed Care – PPO | Admitting: Internal Medicine

## 2015-03-01 ENCOUNTER — Encounter: Payer: Self-pay | Admitting: Internal Medicine

## 2015-03-01 ENCOUNTER — Ambulatory Visit (INDEPENDENT_AMBULATORY_CARE_PROVIDER_SITE_OTHER)
Admission: RE | Admit: 2015-03-01 | Discharge: 2015-03-01 | Disposition: A | Discharge status: Home / Self Care | Payer: BC Managed Care – PPO | Source: Ambulatory Visit | Attending: Internal Medicine | Admitting: Internal Medicine

## 2015-03-01 VITALS — BP 116/78 | HR 93 | Temp 98.0°F | Ht 66.0 in | Wt 152.0 lb

## 2015-03-01 DIAGNOSIS — G43709 Chronic migraine without aura, not intractable, without status migrainosus: Secondary | ICD-10-CM

## 2015-03-01 DIAGNOSIS — F419 Anxiety disorder, unspecified: Secondary | ICD-10-CM

## 2015-03-01 DIAGNOSIS — R059 Cough, unspecified: Secondary | ICD-10-CM

## 2015-03-01 DIAGNOSIS — R05 Cough: Secondary | ICD-10-CM | POA: Diagnosis not present

## 2015-03-01 DIAGNOSIS — IMO0002 Reserved for concepts with insufficient information to code with codable children: Secondary | ICD-10-CM

## 2015-03-01 MED ORDER — CEPHALEXIN 500 MG PO CAPS: 500.0000 mg | ORAL_CAPSULE | Freq: Four times a day (QID) | ORAL | Status: DC

## 2015-03-01 MED ORDER — HYDROCODONE-HOMATROPINE 5-1.5 MG/5ML PO SYRP: 5.0000 mL | ORAL_SOLUTION | Freq: Four times a day (QID) | ORAL | Status: DC | PRN

## 2015-03-01 NOTE — Patient Instructions (Signed)
Ok to stop the levaquin  Please take all new medication as prescribed - the cephalexin antibiotic  Please continue all other medications as before, and refills have been done if requested - the cough medicine  Please have the pharmacy call with any other refills you may need.  Please keep your appointments with your specialists as you may have planned  Please go to the XRAY Department in the Basement (go straight as you get off the elevator) for the x-ray testing  You will be contacted by phone if any changes need to be made immediately.  Otherwise, you will receive a letter about your results with an explanation, but please check with MyChart first.  Please remember to sign up for MyChart if you have not done so, as this will be important to you in the future with finding out test results, communicating by private email, and scheduling acute appointments online when needed.

## 2015-03-01 NOTE — Progress Notes (Signed)
Pre visit review using our clinic review tool, if applicable. No additional management support is needed unless otherwise documented below in the visit note. 

## 2015-03-01 NOTE — Progress Notes (Signed)
Subjective:    Patient ID: Cassandra Gallagher, female    DOB: [birth year], 55 y.o.   MRN: [id number]  HPI  Here to f/u, not significantly improved since last visit, admits she lost 3 antibx pills through misadventure and is afriaid of not being able to finish antibx. Here with 2 wks onset mild to mod days ST, HA, general weakness and malaise, with prod cough greenish sputum, but Pt denies chest pain, increased sob or doe, wheezing, orthopnea, PND, increased LE swelling, palpitations, dizziness or syncope. Denies worsening depressive symptoms, suicidal ideation, or panic; has ongoing anxiety.  As recurring HA's at home, no change in frequency, has zofra/zomig at home, works ok, just wanted to discuss Past Medical History  Diagnosis Date  . DEPRESSIVE DISORDER 07/26/2007  . UTI 05/11/2007  . Lump or mass in breast 11/14/2008  . 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 Cervicalgia 07/26/2007  . BACK PAIN 07/26/2007  . Palpitations 09/21/2008  . Grave's disease     hx of Grave's Disease/Hyperthyroidism  . Hx of migraines   . Anxiety 06/02/2010  . Insomnia 06/02/2010  . 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 Lumbar degenerative disc disease 01/20/2011  . Hyperlipidemia 12/17/2011   Past Surgical History  Procedure Laterality Date  . Breast biopsy    . Cesarean section  10/29/2008    twins  . Dilitation & currettage/hystroscopy with versapoint resection N/A 07/20/2012    Procedure: DILATATION & CURETTAGE/HYSTEROSCOPY WITH VERSAPOINT RESECTION;  Surgeon: Princess Bruins, MD;  Location: [facility];  Service: Gynecology;  Laterality: N/A;  . Hysteroscopy w/d&c  07-20-12    reports that she has quit smoking. She has never used smokeless tobacco. She reports that she does not drink alcohol or use illicit drugs. family history includes Hypertension in her mother. No Known Allergies Current Outpatient Prescriptions on File Prior to Visit  Medication Sig Dispense Refill  . atorvastatin  (LIPITOR) 10 MG tablet Take 1 tablet (10 mg total) by mouth daily. 90 tablet 3  . Calcium 500 MG tablet Take 1,000 mg by mouth daily.    . clonazePAM (KLONOPIN) 0.5 MG tablet take 1 tablet by mouth twice a day if needed 60 tablet 3  . COMBIPATCH 0.05-0.14 MG/DAY Place 1 patch onto the skin Every third day.    Marland Kitchen HYDROcodone-acetaminophen (NORCO) 7.5-325 MG tablet Take 1 tablet by mouth every 6 (six) hours as needed for moderate pain. 120 tablet 0  . Multiple Vitamin (MULTIVITAMIN WITH MINERALS) TABS Take 1 tablet by mouth daily.    . Naproxen Sodium (NAPRELAN) 375 MG TB24 Take 1 tablet by mouth 2 (two) times daily as needed (for pain).     . rizatriptan (MAXALT-MLT) 10 MG disintegrating tablet place 1 tablet ON THE TONGUE UNTIL DISSOLVED.   if needed for migraines may repeat after 2 hours if needed 10 tablet 0   No current facility-administered medications on file prior to visit.   Review of Systems  Constitutional: Negative for unusual diaphoresis or night sweats HENT: Negative for ringing in ear or discharge Eyes: Negative for double vision or worsening visual disturbance.  Respiratory: Negative for choking and stridor.   Gastrointestinal: Negative for vomiting or other signifcant bowel change Genitourinary: Negative for hematuria or change in urine volume.  Musculoskeletal: Negative for other MSK pain or swelling Skin: Negative for color change and worsening wound.  Neurological: Negative for tremors and numbness other than noted  Psychiatric/Behavioral: Negative for decreased concentration or agitation other than above  Objective:   Physical Exam BP 116/78 mmHg  Pulse 93  Temp(Src) 98 F (36.7 C) (Oral)  Ht 5\' 6"  (1.676 m)  Wt 152 lb (68.947 kg)  BMI 24.55 kg/m2  SpO2 98% VS noted, mild ill Constitutional: Pt appears in no significant distress HENT: Head: NCAT.  Right Ear: External ear normal.  Left Ear: External ear normal.  Eyes: . Pupils are equal, round, and  reactive to light. Conjunctivae and EOM are normal Bilat tm's with mild erythema.  Max sinus areas non tender.  Pharynx with mild erythema, no exudate Neck: Normal range of motion. Neck supple.  Cardiovascular: Normal rate and regular rhythm.   Pulmonary/Chest: Effort normal and breath sounds without rales or wheezing.  Neurological: Pt is alert. Not confused , motor grossly intact Skin: Skin is warm. No rash, no LE edema Psychiatric: Pt behavior is normal. No agitation. 1+ nervous    Assessment & Plan:

## 2015-03-02 NOTE — Assessment & Plan Note (Signed)
Mild to mod, for antibx change, cough med prn, check cxr, to f/u any worsening symptoms or concerns

## 2015-03-02 NOTE — Assessment & Plan Note (Signed)
Reassured, pt to cont zofran/zomig prn,  to f/u any worsening symptoms or concerns

## 2015-03-02 NOTE — Assessment & Plan Note (Signed)
stable overall by history and exam, recent data reviewed with pt, and pt to continue medical treatment as before,  to f/u any worsening symptoms or concerns Lab Results  Component Value Date   WBC 5.4 01/31/2014   HGB 14.4 01/31/2014   HCT 43.0 01/31/2014   PLT 301.0 01/31/2014   GLUCOSE 72 01/31/2014   CHOL 221* 01/31/2014   TRIG 123.0 01/31/2014   HDL 38.90* 01/31/2014   LDLDIRECT 148.2 03/22/2012   LDLCALC 158* 01/31/2014   ALT 20 01/31/2014   AST 23 01/31/2014   NA 141 01/31/2014   K 4.3 01/31/2014   CL 109 01/31/2014   CREATININE 0.8 01/31/2014   BUN 12 01/31/2014   CO2 26 01/31/2014   TSH 1.33 01/31/2014

## 2015-03-14 ENCOUNTER — Other Ambulatory Visit: Payer: Self-pay | Admitting: Internal Medicine

## 2015-03-14 NOTE — Telephone Encounter (Signed)
Done hardcopy to Corinne  

## 2015-03-14 NOTE — Telephone Encounter (Signed)
Medication printed signed and faxed to pharmacy  

## 2015-03-14 NOTE — Telephone Encounter (Signed)
Please advise 

## 2015-05-01 ENCOUNTER — Encounter: Payer: Self-pay | Admitting: Internal Medicine

## 2015-05-01 ENCOUNTER — Ambulatory Visit: Payer: BC Managed Care – PPO | Admitting: Internal Medicine

## 2015-05-04 ENCOUNTER — Ambulatory Visit (INDEPENDENT_AMBULATORY_CARE_PROVIDER_SITE_OTHER): Payer: BC Managed Care – PPO | Admitting: Family Medicine

## 2015-05-04 VITALS — BP 100/60 | HR 92 | Temp 99.3°F | Resp 16 | Ht 67.0 in | Wt 155.0 lb

## 2015-05-04 DIAGNOSIS — G43911 Migraine, unspecified, intractable, with status migrainosus: Secondary | ICD-10-CM | POA: Diagnosis not present

## 2015-05-04 MED ORDER — SUMATRIPTAN SUCCINATE 6 MG/0.5ML ~~LOC~~ SOSY: 6.0000 mg | PREFILLED_SYRINGE | SUBCUTANEOUS | Status: DC | PRN

## 2015-05-04 MED ORDER — KETOROLAC TROMETHAMINE 60 MG/2ML IM SOLN
60.0000 mg | Freq: Once | INTRAMUSCULAR | Status: AC
  Administered 2015-05-04: 60 mg via INTRAMUSCULAR

## 2015-05-04 NOTE — Progress Notes (Deleted)
   05/04/2015 2:05 PM   DOB: Oct 29, 1960 / MRN: [id number]  SUBJECTIVE:  Cassandra Gallagher is a 55 y.o. female presenting for migraine.    She has No Known Allergies.   She  has a past medical history of DEPRESSIVE DISORDER (07/26/2007); UTI (05/11/2007); Lump or mass in breast (11/14/2008); DEGENERATIVE DISC DISEASE, CERVICAL SPINE (06/07/2009); Cervicalgia (07/26/2007); BACK PAIN (07/26/2007); Palpitations (09/21/2008); Grave's disease; migraines; Anxiety (06/02/2010); Insomnia (06/02/2010); Cervical spine degeneration (01/20/2011); Lumbar degenerative disc disease (01/20/2011); and Hyperlipidemia (12/17/2011).    She  reports that she has quit smoking. She has never used smokeless tobacco. She reports that she does not drink alcohol or use illicit drugs. She  has no sexual activity history on file. The patient  has past surgical history that includes Breast biopsy; Cesarean section (10/29/2008); Dilatation & currettage/hysteroscopy with versapoint resection (N/A, 07/20/2012); and Hysteroscopy w/D&C (07-20-12).  Her family history includes Hypertension in her mother.  ROS  Problem list and medications reviewed and updated by myself where necessary, and exist elsewhere in the encounter.   OBJECTIVE:  BP 100/60 mmHg  Pulse 92  Temp(Src) 99.3 F (37.4 C) (Oral)  Resp 16  Ht 5\' 7"  (1.702 m)  Wt 155 lb (70.308 kg)  BMI 24.27 kg/m2  SpO2 97%  Physical Exam  No results found for this or any previous visit (from the past 72 hour(s)).  No results found.  ASSESSMENT AND PLAN  There are no diagnoses linked to this encounter.  The patient was advised to call or return to clinic if she does not see an improvement in symptoms or to seek the care of the closest emergency department if she worsens with the above plan.   Philis Fendt, MHS, PA-C [facility] 05/04/2015 2:05 PM

## 2015-05-04 NOTE — Progress Notes (Addendum)
Subjective:    Patient ID: Cassandra Gallagher, female    DOB: 1960/10/17, 55 y.o.   MRN: [id number] By signing my name below, I, Soijett Blue, attest that this documentation has been prepared under the direction and in the presence of Robyn Haber, MD. Electronically Signed: Soijett Blue, ED Scribe. 05/04/2015. 2:11 PM.  Chief Complaint  Patient presents with   Migraine    HPI  Cassandra Gallagher is a 55 y.o. female with a medical hx of migraines who presents to [facility] complaining of migraine onset 36 hours. She notes that yesterday, her HA was at its worst and she was vomiting q 8 hours. She notes that she gets migraines once a month and they are brought on by neck pain due to her DDD in her C-spine. She notes that this HA is similar to HA that she has had in the past. She states that when she gets HA, Zomig nasal spray works best to alleviate her HA. Pt reports that she sees a nuerologist for her symptoms. Pt is having associated symptoms of photophobia. She notes that she has not tried any medications for the relief of her symptoms. She denies any other symptoms.   Pt works at [employer].   Past Medical History  Diagnosis Date   DEPRESSIVE DISORDER 07/26/2007   UTI 05/11/2007   Lump or mass in breast 11/14/2008   DEGENERATIVE [place], CERVICAL SPINE 06/07/2009   Cervicalgia 07/26/2007   BACK PAIN 07/26/2007   Palpitations 09/21/2008   Grave's disease     hx of Grave's Disease/Hyperthyroidism   Hx of migraines    Anxiety 06/02/2010   Insomnia 06/02/2010   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   Lumbar degenerative disc disease 01/20/2011   Hyperlipidemia 12/17/2011   No Known Allergies Current Outpatient Prescriptions on File Prior to Visit  Medication Sig Dispense Refill   Calcium 500 MG tablet Take 1,000 mg by mouth daily.     clonazePAM (KLONOPIN) 0.5 MG tablet take 1 tablet by mouth twice a day if needed 60 tablet 3   COMBIPATCH 0.05-0.14 MG/DAY Place 1 patch onto the  skin Every third day.     Multiple Vitamin (MULTIVITAMIN WITH MINERALS) TABS Take 1 tablet by mouth daily.     Naproxen Sodium (NAPRELAN) 375 MG TB24 Take 1 tablet by mouth 2 (two) times daily as needed (for pain).      atorvastatin (LIPITOR) 10 MG tablet Take 1 tablet (10 mg total) by mouth daily. (Patient not taking: Reported on 05/04/2015) 90 tablet 3   No current facility-administered medications on file prior to visit.     Review of Systems  Constitutional: Negative for fever and chills.  Eyes: Positive for photophobia.  Neurological: Positive for headaches.  All other systems reviewed and are negative.      Objective:   Physical Exam  Constitutional: She is oriented to person, place, and time. She appears well-developed and well-nourished. No distress.  Pt appears haggard but had cranial nerves 3-12 intact. Moving all extremities without difficulty.   HENT:  Head: Normocephalic and atraumatic.  Eyes: EOM are normal.  Neck: Normal range of motion. Neck supple.  Cardiovascular: Normal rate.   Pulmonary/Chest: Effort normal. No respiratory distress.  Musculoskeletal: Normal range of motion.  Neurological: She is alert and oriented to person, place, and time. She has normal strength. No cranial nerve deficit.  Skin: Skin is warm and dry.  Psychiatric: She has a normal mood and affect. Her behavior is normal.  Nursing note and vitals reviewed.        BP 100/60 mmHg   Pulse 92   Temp(Src) 99.3 F (37.4 C) (Oral)   Resp 16   Ht 5\' 7"  (1.702 m)   Wt 155 lb (70.308 kg)   BMI 24.27 kg/m2   SpO2 97%  Assessment & Plan:   This chart was scribed in my presence and reviewed by me personally.    ICD-9-CM ICD-10-CM   1. Intractable migraine with status migrainosus, unspecified migraine type 346.93 G43.911 SUMAtriptan (IMITREX) 6 MG/0.5ML SOSY injection     ketorolac (TORADOL) injection 60 mg     Signed, Robyn Haber, MD

## 2015-05-04 NOTE — Patient Instructions (Addendum)
Unfortunately, we do not have the injectable Imitrex or other trip tans in the office. Therefore I'm ordering this to be picked up at your local pharmacy and he can pick this up in the next half hour.

## 2015-05-06 ENCOUNTER — Encounter: Payer: Self-pay | Admitting: Internal Medicine

## 2015-05-06 ENCOUNTER — Ambulatory Visit (INDEPENDENT_AMBULATORY_CARE_PROVIDER_SITE_OTHER): Payer: BC Managed Care – PPO | Admitting: Internal Medicine

## 2015-05-06 VITALS — BP 118/80 | HR 92 | Temp 98.6°F | Ht 67.0 in | Wt 159.5 lb

## 2015-05-06 DIAGNOSIS — F329 Major depressive disorder, single episode, unspecified: Secondary | ICD-10-CM

## 2015-05-06 DIAGNOSIS — G43709 Chronic migraine without aura, not intractable, without status migrainosus: Secondary | ICD-10-CM

## 2015-05-06 DIAGNOSIS — L659 Nonscarring hair loss, unspecified: Secondary | ICD-10-CM | POA: Diagnosis not present

## 2015-05-06 DIAGNOSIS — IMO0002 Reserved for concepts with insufficient information to code with codable children: Secondary | ICD-10-CM

## 2015-05-06 DIAGNOSIS — Z0000 Encounter for general adult medical examination without abnormal findings: Secondary | ICD-10-CM | POA: Diagnosis not present

## 2015-05-06 DIAGNOSIS — E785 Hyperlipidemia, unspecified: Secondary | ICD-10-CM | POA: Diagnosis not present

## 2015-05-06 DIAGNOSIS — F32A Depression, unspecified: Secondary | ICD-10-CM

## 2015-05-06 MED ORDER — RIZATRIPTAN BENZOATE 10 MG PO TABS: 10.0000 mg | ORAL_TABLET | ORAL | Status: DC | PRN

## 2015-05-06 NOTE — Assessment & Plan Note (Addendum)
[place] for maxalt prn,  to f/u any worsening symptoms or concerns, to also f/u with neurology as planned  In addition to the time spent performing CPE, I spent an additional 40 minutes face to face,in which greater than 50% of this time was spent in counseling and coordination of care for patient's acute illness as documented.

## 2015-05-06 NOTE — Assessment & Plan Note (Signed)
Lab Results  Component Value Date   LDLCALC 158* 01/31/2014  mod elevated, encouraged low chol diet, for f/u lab, consider statin for ldl > 100

## 2015-05-06 NOTE — Patient Instructions (Signed)
Please take all new medication as prescribed  - the maxalt  Please continue all other medications as before, and refills have been done if requested.  Please have the pharmacy call with any other refills you may need.  Please continue your efforts at being more active, low cholesterol diet, and weight control.  You are otherwise up to date with prevention measures today.  Please keep your appointments with your specialists as you may have planned  Please go to the LAB in the Basement (turn left off the elevator) for the tests to be done at your convenience  You will be contacted by phone if any changes need to be made immediately.  Otherwise, you will receive a letter about your results with an explanation, but please check with MyChart first.  Please remember to sign up for MyChart if you have not done so, as this will be important to you in the future with finding out test results, communicating by private email, and scheduling acute appointments online when needed.  Please return in 1 year for your yearly visit, or sooner if needed, with Lab testing done 3-5 days before

## 2015-05-06 NOTE — Assessment & Plan Note (Signed)
stable overall by history and exam, recent data reviewed with pt, and pt to continue medical treatment as before,  to f/u any worsening symptoms or concerns Lab Results  Component Value Date   WBC 5.4 01/31/2014   HGB 14.4 01/31/2014   HCT 43.0 01/31/2014   PLT 301.0 01/31/2014   GLUCOSE 72 01/31/2014   CHOL 221* 01/31/2014   TRIG 123.0 01/31/2014   HDL 38.90* 01/31/2014   LDLDIRECT 148.2 03/22/2012   LDLCALC 158* 01/31/2014   ALT 20 01/31/2014   AST 23 01/31/2014   NA 141 01/31/2014   K 4.3 01/31/2014   CL 109 01/31/2014   CREATININE 0.8 01/31/2014   BUN 12 01/31/2014   CO2 26 01/31/2014   TSH 1.33 01/31/2014

## 2015-05-06 NOTE — Progress Notes (Signed)
Subjective:    Patient ID: Cassandra Gallagher, female    DOB: 07-31-1960, 55 y.o.   MRN: [id number]  HPI  Here for wellness and f/u;  Overall doing ok;  Pt denies Chest pain, worsening SOB, DOE, wheezing, orthopnea, PND, worsening LE edema, palpitations, dizziness or syncope.  Pt denies neurological change such as new headache, facial or extremity weakness.  Pt denies polydipsia, polyuria, or low sugar symptoms. Pt states overall good compliance with treatment and medications, good tolerability, and has been trying to follow appropriate diet.  Pt denies worsening depressive symptoms, suicidal ideation or panic. No fever, night sweats, wt loss, loss of appetite, or other constitutional symptoms.  Pt states good ability with ADL's, has low fall risk, home safety reviewed and adequate, no other significant changes in hearing or vision, and only occasionally active with exercise.  Having recurring migraine, zomig helps and freq and severity not increased but ran out recenlty 2 days ago, out of zomig, asking about samples  Had to be seen at Urgent care, given zomig nasal spry but insurance does not cover well.  Has norco for chronic neck pain, has not tried fioricet.  Not seeing pain management - dont seem to care and very expensive,, seeing neurologist. Dr Lowella Grip in [place] area.  Has pain contract with neurology. Also has had hair loss to scalp increased recently noticed taking with baths, ,Denies hyper or hypo thyroid symptoms such as voice, skin or hair change..  No longer taking topamax so not likley to be the cause. Also has been getting occipital nerve blocks q 2 mo which do help but she wishes would last longer, also had had botox exposue.   Hx of graves dz, no thyroid check recently.  Denies hyper or hypo thyroid symptoms such as voice, skin or hair change. Past Medical History  Diagnosis Date  . DEPRESSIVE DISORDER 07/26/2007  . UTI 05/11/2007  . Lump or mass in breast 11/14/2008  .  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 Cervicalgia 07/26/2007  . BACK PAIN 07/26/2007  . Palpitations 09/21/2008  . Grave's disease     hx of Grave's Disease/Hyperthyroidism  . Hx of migraines   . Anxiety 06/02/2010  . Insomnia 06/02/2010  . 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 Lumbar degenerative disc disease 01/20/2011  . Hyperlipidemia 12/17/2011   Past Surgical History  Procedure Laterality Date  . Breast biopsy    . Cesarean section  10/29/2008    twins  . Dilitation & currettage/hystroscopy with versapoint resection N/A 07/20/2012    Procedure: DILATATION & CURETTAGE/HYSTEROSCOPY WITH VERSAPOINT RESECTION;  Surgeon: Princess Bruins, MD;  Location: [facility];  Service: Gynecology;  Laterality: N/A;  . Hysteroscopy w/d&c  07-20-12    reports that she has quit smoking. She has never used smokeless tobacco. She reports that she does not drink alcohol or use illicit drugs. family history includes Hypertension in her mother. No Known Allergies Current Outpatient Prescriptions on File Prior to Visit  Medication Sig Dispense Refill  . atorvastatin (LIPITOR) 10 MG tablet Take 1 tablet (10 mg total) by mouth daily. 90 tablet 3  . Calcium 500 MG tablet Take 1,000 mg by mouth daily.    . clonazePAM (KLONOPIN) 0.5 MG tablet take 1 tablet by mouth twice a day if needed 60 tablet 3  . COMBIPATCH 0.05-0.14 MG/DAY Place 1 patch onto the skin Every third day.    . Multiple Vitamin (MULTIVITAMIN WITH MINERALS) TABS Take 1 tablet by mouth daily.    Marland Kitchen  Naproxen Sodium (NAPRELAN) 375 MG TB24 Take 1 tablet by mouth 2 (two) times daily as needed (for pain).      No current facility-administered medications on file prior to visit.   Review of Systems Constitutional: Negative for increased diaphoresis, or other activity, appetite or siginficant weight change other than noted HENT: Negative for worsening hearing loss, ear pain, facial swelling, mouth sores and neck stiffness.   Eyes:  Negative for other worsening pain, redness or visual disturbance.  Respiratory: Negative for choking or stridor Cardiovascular: Negative for other chest pain and palpitations.  Gastrointestinal: Negative for worsening diarrhea, blood in stool, or abdominal distention Genitourinary: Negative for hematuria, flank pain or change in urine volume.  Musculoskeletal: Negative for myalgias or other joint complaints.  Skin: Negative for other color change and wound or drainage.  Neurological: Negative for syncope and numbness. other than noted Hematological: Negative for adenopathy. or other swelling Psychiatric/Behavioral: Negative for hallucinations, SI, self-injury, decreased concentration or other worsening agitation.      Objective:   Physical Exam BP 118/80 mmHg  Pulse 92  Temp(Src) 98.6 F (37 C) (Oral)  Ht 5\' 7"  (1.702 m)  Wt 159 lb 8 oz (72.349 kg)  BMI 24.98 kg/m2  SpO2 95% VS noted,  Constitutional: Pt is oriented to person, place, and time. Appears well-developed and well-nourished, in no significant distress Head: Normocephalic and atraumatic  Eyes: Conjunctivae and EOM are normal. Pupils are equal, round, and reactive to light Right Ear: External ear normal.  Left Ear: External ear normal Nose: Nose normal.  Mouth/Throat: Oropharynx is clear and moist  Neck: Normal range of motion. Neck supple. No JVD present. No tracheal deviation present or significant neck LA or mass Cardiovascular: Normal rate, regular rhythm, normal heart sounds and intact distal pulses.   Pulmonary/Chest: Effort normal and breath sounds without rales or wheezing  Abdominal: Soft. Bowel sounds are normal. NT. No HSM  Musculoskeletal: Normal range of motion. Exhibits no edema Lymphadenopathy: Has no cervical adenopathy.  Neurological: Pt is alert and oriented to person, place, and time. Pt has normal reflexes. No cranial nerve deficit. Motor grossly intact Skin: Skin is warm and dry. No rash noted or new  ulcers, does have some mild thinning scalp hair diffusely Psychiatric:  Has normal mood and affect. Behavior is normal.     Assessment & Plan:

## 2015-05-06 NOTE — Assessment & Plan Note (Signed)

## 2015-05-06 NOTE — Assessment & Plan Note (Signed)
With mild recent thinning, also for TSH with labs,  to f/u any worsening symptoms or concerns, consider refer dermatology

## 2015-05-06 NOTE — Progress Notes (Signed)
Pre visit review using our clinic review tool, if applicable. No additional management support is needed unless otherwise documented below in the visit note. 

## 2015-05-13 ENCOUNTER — Encounter
Payer: BC Managed Care – PPO | Attending: Physical Medicine and Rehabilitation | Admitting: Physical Medicine & Rehabilitation

## 2015-05-13 ENCOUNTER — Encounter: Payer: Self-pay | Admitting: Physical Medicine & Rehabilitation

## 2015-05-13 VITALS — BP 120/74 | HR 76 | Resp 14

## 2015-05-13 DIAGNOSIS — M5481 Occipital neuralgia: Secondary | ICD-10-CM

## 2015-05-13 DIAGNOSIS — M5136 Other intervertebral disc degeneration, lumbar region: Secondary | ICD-10-CM | POA: Insufficient documentation

## 2015-05-13 DIAGNOSIS — IMO0002 Reserved for concepts with insufficient information to code with codable children: Secondary | ICD-10-CM

## 2015-05-13 DIAGNOSIS — G8929 Other chronic pain: Secondary | ICD-10-CM | POA: Diagnosis not present

## 2015-05-13 DIAGNOSIS — M47812 Spondylosis without myelopathy or radiculopathy, cervical region: Secondary | ICD-10-CM

## 2015-05-13 DIAGNOSIS — F419 Anxiety disorder, unspecified: Secondary | ICD-10-CM | POA: Insufficient documentation

## 2015-05-13 DIAGNOSIS — M47817 Spondylosis without myelopathy or radiculopathy, lumbosacral region: Secondary | ICD-10-CM | POA: Diagnosis present

## 2015-05-13 DIAGNOSIS — G43709 Chronic migraine without aura, not intractable, without status migrainosus: Secondary | ICD-10-CM | POA: Insufficient documentation

## 2015-05-13 DIAGNOSIS — R51 Headache: Secondary | ICD-10-CM

## 2015-05-13 DIAGNOSIS — M51369 Other intervertebral disc degeneration, lumbar region without mention of lumbar back pain or lower extremity pain: Secondary | ICD-10-CM

## 2015-05-13 DIAGNOSIS — R519 Headache, unspecified: Secondary | ICD-10-CM

## 2015-05-13 NOTE — Progress Notes (Signed)
Subjective:    Patient ID: Cassandra Gallagher, female    DOB: 1960/05/30, 55 y.o.   MRN: [id number]  HPI  Cassandra Gallagher is here in follow up of her chronic headaches. She continues to have headaches but as a whole isimproved. She has results with the botox but she's not sure how much as she's been receiving them for so long. The recent occpital nerve blocks have been much more helpful also. She still struggles with substantial senstivity in her right neck/occiput which impairs her ability to sleep or lie down on her head. She went to urgent care for a severe headache recently.    I reviewed her cervical MRI from 2014 C5-C6: There is mild disc bulging and uncinate spurring. No cord deformity or foraminal compromise.  C6-C7: The disc appears normal. There is mild facet hypertrophy on the left. No foraminal compromise or nerve root encroachment.  C7-T1: Mild facet hypertrophy on the right. The disc appears normal. No foraminal compromise or nerve root encroachment.    Pain Inventory Average Pain 6 Pain Right Now 2 My pain is dull, stabbing, tingling and aching  In the last 24 hours, has pain interfered with the following? General activity 5 Relation with others 6 Enjoyment of life 7 What TIME of day is your pain at its worst? night Sleep (in general) Poor  Pain is worse with: no selection Pain improves with: heat/ice, therapy/exercise, medication, TENS and injections Relief from Meds: 8  Mobility walk without assistance ability to climb steps?  yes do you drive?  yes Do you have any goals in this area?  yes  Function employed # of hrs/week 30  Neuro/Psych No problems in this area  Prior Studies Any changes since last visit?  no  Physicians involved in your care Any changes since last visit?  no   Family History  Problem Relation Age of Onset  . Hypertension Mother    Social History   Social History  . Marital Status: Significant Other    Spouse Name:  N/A  . Number of Children: 2  . 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 Smoking status: Former Research scientist (life sciences)  . Smokeless tobacco: Never Used     Comment: smoked for 6 years, quit 15 years ago  . Alcohol Use: No  . Drug Use: No  . Sexual Activity: Not Asked   Other Topics Concern  . None   Social History Narrative   2nd child born recently in September 2010.   Married, one child from previous marriage lives in [place]   Past Surgical History  Procedure Laterality Date  . Breast biopsy    . Cesarean section  10/29/2008    twins  . Dilitation & currettage/hystroscopy with versapoint resection N/A 07/20/2012    Procedure: DILATATION & CURETTAGE/HYSTEROSCOPY WITH VERSAPOINT RESECTION;  Surgeon: Princess Bruins, MD;  Location: [facility];  Service: Gynecology;  Laterality: N/A;  . Hysteroscopy w/d&c  07-20-12   Past Medical History  Diagnosis Date  . DEPRESSIVE DISORDER 07/26/2007  . UTI 05/11/2007  . Lump or mass in breast 11/14/2008  . 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 Cervicalgia 07/26/2007  . BACK PAIN 07/26/2007  . Palpitations 09/21/2008  . Grave's disease     hx of Grave's Disease/Hyperthyroidism  . Hx of migraines   . Anxiety 06/02/2010  . Insomnia 06/02/2010  . 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 Lumbar  degenerative disc disease 01/20/2011  . Hyperlipidemia 12/17/2011   BP 120/74 mmHg  Pulse 76  Resp 14  SpO2 96%  Opioid Risk Score:   Fall Risk Score:  `1  Depression screen PHQ 2/9  Depression screen [facility] 2/9 05/04/2015 10/18/2014 09/19/2014 05/01/2014 05/01/2014  Decreased Interest 0 1 1 1 1   Down, Depressed, Hopeless 0 1 1 1 1   PHQ - 2 Score 0 2 2 2 2   Altered sleeping - - - 2 2  Tired, decreased energy - - - 2 2  Change in appetite - - - 0 0  Feeling bad or failure about yourself  - - - 1 1  Trouble concentrating - - - 0 0  Moving slowly or fidgety/restless - - - 0 0  Suicidal  thoughts - - - 0 0  PHQ-9 Score - - - 7 7     Review of Systems  All other systems reviewed and are negative.      Objective:   Physical Exam Constitutional: She is oriented to person, place, and time. She appears well-developed and well-nourished.  HENT:  Head: Normocephalic and atraumatic.  Right Ear: External ear normal.  Left Ear: External ear normal.  Eyes: Conjunctivae and EOM are normal. Pupils are equal, round, and reactive to light.  Neck: Normal range of motion. Neck supple.  Musculoskeletal:   Right shoulder: Normal.   Left shoulder: Normal.   Right elbow: Normal.  Left elbow: Normal.   Right wrist: Normal.   Left wrist: Normal.  Displays a head forward posture. Shoulders slightly rounded. Neurological: She is alert and oriented to person, place, and time. She has normal strength. No sensory deficit. Coordination normal. right occipital/suboccipital area tender to touch.  Psychiatric: She has a normal mood and affect.  Nursing note and vitals reviewed.         Assessment & Plan:  1. Chronic headaches combination of occipital neuralgia and chronic migraine. Mild cervical spondylosis and degenerative disc may contribute as well.   Will hold off on further botox injections at this point and allow her to focus on the nerve blocks.   Discussed the importance of posture/exercise.   Reviewed potential oral rx'es for her neuralgia and migraine including depakote, tegretol, lamictal which she hasn't tried before.   Over half of the 20 min visit was spent counseling and coordinating care.

## 2015-05-13 NOTE — Patient Instructions (Signed)
MIGRAINE PROPHYLAXIS: DEPAKOTE (vpa), TEGRETOL, LAMICTAL  CONTINUE WORKING ON CERVICAL RANGE OF MOTION AND POSTURE.

## 2015-05-14 ENCOUNTER — Other Ambulatory Visit: Payer: Self-pay

## 2015-05-14 MED ORDER — ATORVASTATIN CALCIUM 10 MG PO TABS: 10.0000 mg | ORAL_TABLET | Freq: Every day | ORAL | Status: DC

## 2015-05-27 ENCOUNTER — Telehealth: Payer: Self-pay

## 2015-05-27 NOTE — Telephone Encounter (Signed)
Please advise patient is requesting refill on maxalt

## 2015-05-28 MED ORDER — RIZATRIPTAN BENZOATE 10 MG PO TABS: 10.0000 mg | ORAL_TABLET | ORAL | Status: DC | PRN

## 2015-05-28 NOTE — Addendum Note (Signed)
Addended by: Biagio Borg on: 05/28/2015 01:11 PM   Modules accepted: Orders

## 2015-05-28 NOTE — Telephone Encounter (Signed)
Done erx 

## 2015-06-06 ENCOUNTER — Encounter: Payer: Self-pay | Admitting: Physical Medicine & Rehabilitation

## 2015-06-07 NOTE — Telephone Encounter (Signed)
Please advise on scheduling botox? The last OV note states that you are holding those for nerve blocks.Marland KitchenMarland Kitchen

## 2015-06-11 ENCOUNTER — Encounter: Payer: Self-pay | Admitting: Physical Medicine & Rehabilitation

## 2015-07-05 ENCOUNTER — Ambulatory Visit: Payer: BC Managed Care – PPO | Admitting: Adult Health

## 2015-07-05 DIAGNOSIS — Z0289 Encounter for other administrative examinations: Secondary | ICD-10-CM

## 2015-07-10 ENCOUNTER — Encounter: Payer: Self-pay | Admitting: Internal Medicine

## 2015-07-10 ENCOUNTER — Telehealth: Payer: Self-pay | Admitting: Internal Medicine

## 2015-07-10 ENCOUNTER — Ambulatory Visit (INDEPENDENT_AMBULATORY_CARE_PROVIDER_SITE_OTHER): Payer: BC Managed Care – PPO | Admitting: Internal Medicine

## 2015-07-10 VITALS — BP 122/78 | HR 74 | Temp 98.4°F | Resp 20 | Wt 154.0 lb

## 2015-07-10 DIAGNOSIS — J029 Acute pharyngitis, unspecified: Secondary | ICD-10-CM | POA: Diagnosis not present

## 2015-07-10 MED ORDER — AZITHROMYCIN 250 MG PO TABS: ORAL_TABLET | ORAL | Status: DC

## 2015-07-10 NOTE — Telephone Encounter (Signed)
Please advise 

## 2015-07-10 NOTE — Progress Notes (Signed)
Subjective:    Patient ID: Cassandra Gallagher, female    DOB: 07/04/1960, 55 y.o.   MRN: [id number]  HPI  Here with 1 wk gradually worsening ST with fever, pressure, headache, general weakness and malaise, and non prod cough, but pt denies chest pain, wheezing, increased sob or doe, orthopnea, PND, increased LE swelling, palpitations, dizziness or syncope.  2 daughter now on antibx for proven strep in the past wk.  Pt has 2 pills leftover zpakc she did not finish prior, has taken those yesterday, some improved today with reduced pain, swelling feeling Past Medical History  Diagnosis Date  . DEPRESSIVE DISORDER 07/26/2007  . UTI 05/11/2007  . Lump or mass in breast 11/14/2008  . 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 Cervicalgia 07/26/2007  . BACK PAIN 07/26/2007  . Palpitations 09/21/2008  . Grave's disease     hx of Grave's Disease/Hyperthyroidism  . Hx of migraines   . Anxiety 06/02/2010  . Insomnia 06/02/2010  . 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 Lumbar degenerative disc disease 01/20/2011  . Hyperlipidemia 12/17/2011   Past Surgical History  Procedure Laterality Date  . Breast biopsy    . Cesarean section  10/29/2008    twins  . Dilitation & currettage/hystroscopy with versapoint resection N/A 07/20/2012    Procedure: DILATATION & CURETTAGE/HYSTEROSCOPY WITH VERSAPOINT RESECTION;  Surgeon: Princess Bruins, MD;  Location: [facility];  Service: Gynecology;  Laterality: N/A;  . Hysteroscopy w/d&c  07-20-12    reports that she has quit smoking. She has never used smokeless tobacco. She reports that she does not drink alcohol or use illicit drugs. family history includes Hypertension in her mother. No Known Allergies Current Outpatient Prescriptions on File Prior to Visit  Medication Sig Dispense Refill  . atorvastatin (LIPITOR) 10 MG tablet Take 1 tablet (10 mg total) by mouth daily. 90 tablet 3  . Calcium 500 MG tablet Take 1,000 mg by mouth daily.    . clonazePAM  (KLONOPIN) 0.5 MG tablet take 1 tablet by mouth twice a day if needed 60 tablet 3  . COMBIPATCH 0.05-0.14 MG/DAY Place 1 patch onto the skin Every third day.    Marland Kitchen HYDROcodone-acetaminophen (NORCO) 7.5-325 MG tablet take 1 tablet by mouth every 12 hours if needed  0  . Multiple Vitamin (MULTIVITAMIN WITH MINERALS) TABS Take 1 tablet by mouth daily.    . Naproxen Sodium (NAPRELAN) 375 MG TB24 Take 1 tablet by mouth 2 (two) times daily as needed (for pain).     . OXYCONTIN 10 MG 12 hr tablet     . rizatriptan (MAXALT) 10 MG tablet Take 1 tablet (10 mg total) by mouth as needed for migraine. May repeat in 2 hours if needed 10 tablet 5  . ZOMIG 5 MG nasal solution   0   No current facility-administered medications on file prior to visit.   Review of Systems  Constitutional: Negative for unusual diaphoresis or night sweats HENT: Negative for ear swelling or discharge Eyes: Negative for worsening visual haziness  Respiratory: Negative for choking and stridor.   Gastrointestinal: Negative for distension or worsening eructation Genitourinary: Negative for retention or change in urine volume.  Musculoskeletal: Negative for other MSK pain or swelling Skin: Negative for color change and worsening wound Neurological: Negative for tremors and numbness other than noted  Psychiatric/Behavioral: Negative for decreased concentration or agitation other than above       Objective:   Physical Exam BP 122/78 mmHg  Pulse 74  Temp(Src) 98.4 F (36.9 C) (Oral)  Resp 20  Wt 154 lb (69.854 kg)  SpO2 96% VS noted, mild ill appearing  Constitutional: Pt appears in no apparent distress HENT: Head: NCAT.  Right Ear: External ear normal.  Left Ear: External ear normal.  Bilat tm's with mild erythema.  Max sinus areas nontender       Pharynx with severe erythema, slight exudate, with bilat submandib LA Eyes: . Pupils are equal, round, and reactive to light. Conjunctivae and EOM are normal Neck: Normal range  of motion. Neck supple.  Cardiovascular: Normal rate and regular rhythm.   Pulmonary/Chest: Effort normal and breath sounds without rales or wheezing.  Neurological: Pt is alert. Not confused , motor grossly intact Skin: Skin is warm. No rash, no LE edema Psychiatric: Pt behavior is normal. No agitation.     Assessment & Plan:

## 2015-07-10 NOTE — Patient Instructions (Signed)
Please take all new medication as prescribed - the antibiotic  Please continue all other medications as before, and refills have been done if requested.  Please have the pharmacy call with any other refills you may need.  Please keep your appointments with your specialists as you may have planned   

## 2015-07-10 NOTE — Assessment & Plan Note (Signed)
Mild to mod, for antibx course,  to f/u any worsening symptoms or concerns 

## 2015-07-10 NOTE — Telephone Encounter (Signed)
States patient is at pharmacy.  States Dr. Jenny Reichmann was to send antibiotic over and pharmacy has not received.  Please follow up with pharmacy in regards.

## 2015-07-10 NOTE — Telephone Encounter (Signed)
Not sure what happened, was resent to [facility], as pt stated this was her current pharmacy when asked at the [facility]

## 2015-07-10 NOTE — Progress Notes (Signed)
Pre visit review using our clinic review tool, if applicable. No additional management support is needed unless otherwise documented below in the visit note. 

## 2015-07-12 ENCOUNTER — Telehealth: Payer: Self-pay

## 2015-07-12 ENCOUNTER — Encounter: Payer: Self-pay | Admitting: Physical Medicine & Rehabilitation

## 2015-07-12 MED ORDER — CLONAZEPAM 0.5 MG PO TABS: ORAL_TABLET | ORAL | Status: DC

## 2015-07-12 NOTE — Telephone Encounter (Signed)
Please call pt to make a new appointment. Thanks!

## 2015-07-12 NOTE — Telephone Encounter (Signed)
Please advise patient is requesting refill on clonazepam 

## 2015-07-12 NOTE — Addendum Note (Signed)
Addended by: Biagio Borg on: 07/12/2015 04:43 PM   Modules accepted: Orders

## 2015-07-12 NOTE — Telephone Encounter (Signed)
Done hardcopy to Corinne  

## 2015-07-15 ENCOUNTER — Encounter: Payer: BC Managed Care – PPO | Admitting: Physical Medicine & Rehabilitation

## 2015-07-15 NOTE — Telephone Encounter (Signed)
Medication faxed to pharmacy 

## 2015-07-16 ENCOUNTER — Encounter: Payer: Self-pay | Admitting: Internal Medicine

## 2015-07-19 NOTE — Telephone Encounter (Signed)
I just got back to the office from vacation  A prescription was done June 2 and should be available for pickup  thanks

## 2015-07-22 ENCOUNTER — Telehealth: Payer: Self-pay | Admitting: *Deleted

## 2015-07-22 NOTE — Telephone Encounter (Signed)
Per EMR, this has already been done recently, as last wk.  Please check front desk to see if rx is waiting for pickup and pt is unaware

## 2015-07-22 NOTE — Telephone Encounter (Signed)
Left msg on triage requesting refill on pt clonazepam. Last filled 06/06/15...Cassandra Gallagher

## 2015-07-23 NOTE — Telephone Encounter (Signed)
Rx was faxed to the wrong pharmacy should [facility]. Called [facility] left refill on pharmacy vm...Johny Chess

## 2015-08-06 ENCOUNTER — Encounter: Payer: Self-pay | Admitting: Physical Medicine & Rehabilitation

## 2015-08-07 ENCOUNTER — Encounter: Payer: BC Managed Care – PPO | Admitting: Physical Medicine & Rehabilitation

## 2015-09-04 ENCOUNTER — Ambulatory Visit (INDEPENDENT_AMBULATORY_CARE_PROVIDER_SITE_OTHER): Payer: BC Managed Care – PPO | Admitting: Internal Medicine

## 2015-09-04 VITALS — BP 124/84 | HR 74 | Temp 98.1°F | Resp 16 | Wt 151.0 lb

## 2015-09-04 DIAGNOSIS — J069 Acute upper respiratory infection, unspecified: Secondary | ICD-10-CM

## 2015-09-04 MED ORDER — HYDROCODONE-HOMATROPINE 5-1.5 MG/5ML PO SYRP: 5.0000 mL | ORAL_SOLUTION | Freq: Four times a day (QID) | ORAL | 0 refills | Status: AC | PRN

## 2015-09-04 MED ORDER — LEVOFLOXACIN 500 MG PO TABS: 500.0000 mg | ORAL_TABLET | Freq: Every day | ORAL | 0 refills | Status: AC

## 2015-09-04 NOTE — Progress Notes (Signed)
Subjective:    Patient ID: Cassandra Gallagher, female    DOB: 11-25-1960, 55 y.o.   MRN: [id number]  HPI   Here with 1 wk acute onset fever, facial pain, pressure, headache, general weakness and malaise, and greenish d/c, with mild ST and cough, but pt denies wheezing, increased sob or doe, orthopnea, PND, increased LE swelling, palpitations, dizziness or syncope.  Has been sleeping with children this wk as father is not home, one child with cough but o/w no sick contacts.  Does have some occasional soreness to the chest and fatigue.  Pt denies new neurological symptoms such as new headache, or facial or extremity weakness or numbness   Pt denies polydipsia, polyuria, Past Medical History:  Diagnosis Date  . Anxiety 06/02/2010  . BACK PAIN 07/26/2007  . 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 Cervicalgia 07/26/2007  . 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 DEPRESSIVE DISORDER 07/26/2007  . Grave's disease    hx of Grave's Disease/Hyperthyroidism  . Hx of migraines   . Hyperlipidemia 12/17/2011  . Insomnia 06/02/2010  . Lumbar degenerative disc disease 01/20/2011  . Lump or mass in breast 11/14/2008  . Palpitations 09/21/2008  . UTI 05/11/2007   Past Surgical History:  Procedure Laterality Date  . BREAST BIOPSY    . CESAREAN SECTION  10/29/2008   twins  . DILITATION & CURRETTAGE/HYSTROSCOPY WITH VERSAPOINT RESECTION N/A 07/20/2012   Procedure: DILATATION & CURETTAGE/HYSTEROSCOPY WITH VERSAPOINT RESECTION;  Surgeon: Princess Bruins, MD;  Location: [facility];  Service: Gynecology;  Laterality: N/A;  . HYSTEROSCOPY W/D&C  07-20-12    reports that she has quit smoking. She has never used smokeless tobacco. She reports that she does not drink alcohol or use drugs. family history includes Hypertension in her mother. No Known Allergies Current Outpatient Prescriptions on File Prior to Visit  Medication Sig Dispense Refill  . atorvastatin (LIPITOR) 10 MG tablet Take 1 tablet (10 mg  total) by mouth daily. 90 tablet 3  . Calcium 500 MG tablet Take 1,000 mg by mouth daily.    . clonazePAM (KLONOPIN) 0.5 MG tablet take 1 tablet by mouth twice a day if needed 60 tablet 3  . COMBIPATCH 0.05-0.14 MG/DAY Place 1 patch onto the skin Every third day.    Marland Kitchen HYDROcodone-acetaminophen (NORCO) 7.5-325 MG tablet take 1 tablet by mouth every 12 hours if needed  0  . Multiple Vitamin (MULTIVITAMIN WITH MINERALS) TABS Take 1 tablet by mouth daily.    . Naproxen Sodium (NAPRELAN) 375 MG TB24 Take 1 tablet by mouth 2 (two) times daily as needed (for pain).     . OXYCONTIN 10 MG 12 hr tablet     . rizatriptan (MAXALT) 10 MG tablet Take 1 tablet (10 mg total) by mouth as needed for migraine. May repeat in 2 hours if needed 10 tablet 5  . ZOMIG 5 MG nasal solution   0   No current facility-administered medications on file prior to visit.    Review of Systems  Constitutional: Negative for unusual diaphoresis or night sweats HENT: Negative for ear swelling or discharge Eyes: Negative for worsening visual haziness  Respiratory: Negative for choking and stridor.   Gastrointestinal: Negative for distension or worsening eructation Genitourinary: Negative for retention or change in urine volume.  Musculoskeletal: Negative for other MSK pain or swelling Skin: Negative for color change and worsening wound Neurological: Negative for tremors and numbness other than noted  Psychiatric/Behavioral: Negative for decreased concentration or agitation other than  above       Objective:   Physical Exam BP 124/84   Pulse 74   Temp 98.1 F (36.7 C) (Oral)   Resp 16   Wt 151 lb (68.5 kg)   SpO2 98%   BMI 23.65 kg/m  VS noted,  Constitutional: Pt appears in no apparent distress HENT: Head: NCAT.  Right Ear: External ear normal.  Left Ear: External ear normal.  Bilat tm's with mild erythema.  Max sinus areas mild tender.  Pharynx with mild erythema, no exudate Eyes: . Pupils are equal, round, and  reactive to light. Conjunctivae and EOM are normal Neck: Normal range of motion. Neck supple.  Cardiovascular: Normal rate and regular rhythm.   Pulmonary/Chest: Effort normal and breath sounds without rales or wheezing.  Neurological: Pt is alert. Not confused , motor grossly intact Skin: Skin is warm. No rash, no LE edema Psychiatric: Pt behavior is normal. No agitation.     Assessment & Plan:

## 2015-09-04 NOTE — Patient Instructions (Signed)
Please take all new medication as prescribed - the antibiotic, and cough medicine  Please continue all other medications as before, and refills have been done if requested.  Please have the pharmacy call with any other refills you may need.  Please keep your appointments with your specialists as you may have planned      

## 2015-09-04 NOTE — Progress Notes (Signed)
Pre visit review using our clinic review tool, if applicable. No additional management support is needed unless otherwise documented below in the visit note. 

## 2015-09-05 NOTE — Assessment & Plan Note (Signed)
Mild to mod, for antibx course,  to f/u any worsening symptoms or concerns 

## 2015-09-18 NOTE — Telephone Encounter (Signed)
error 

## 2015-12-09 ENCOUNTER — Other Ambulatory Visit: Payer: Self-pay | Admitting: Internal Medicine

## 2015-12-10 MED ORDER — CLONAZEPAM 0.5 MG PO TABS: ORAL_TABLET | ORAL | 3 refills | Status: DC

## 2015-12-10 NOTE — Telephone Encounter (Signed)
Faxed script back to [facility].../lmb 

## 2015-12-10 NOTE — Telephone Encounter (Signed)
Done hardcopy to Corinne  

## 2015-12-11 ENCOUNTER — Encounter: Payer: Self-pay | Admitting: Internal Medicine

## 2015-12-19 ENCOUNTER — Ambulatory Visit (INDEPENDENT_AMBULATORY_CARE_PROVIDER_SITE_OTHER): Payer: BC Managed Care – PPO | Admitting: Internal Medicine

## 2015-12-19 ENCOUNTER — Encounter: Payer: Self-pay | Admitting: Internal Medicine

## 2015-12-19 VITALS — BP 122/80 | HR 100 | Temp 98.9°F | Resp 20 | Wt 153.0 lb

## 2015-12-19 DIAGNOSIS — J069 Acute upper respiratory infection, unspecified: Secondary | ICD-10-CM

## 2015-12-19 MED ORDER — HYDROCODONE-HOMATROPINE 5-1.5 MG/5ML PO SYRP: 5.0000 mL | ORAL_SOLUTION | Freq: Four times a day (QID) | ORAL | 0 refills | Status: AC | PRN

## 2015-12-19 MED ORDER — CEPHALEXIN 500 MG PO CAPS: 500.0000 mg | ORAL_CAPSULE | Freq: Three times a day (TID) | ORAL | 0 refills | Status: AC

## 2015-12-19 NOTE — Progress Notes (Signed)
Subjective:    Patient ID: Cassandra Gallagher, female    DOB: 1960/04/16, 55 y.o.   MRN: [id number]  HPI  Here with 2-3 days acute onset fever, facial pain, pressure, headache, general weakness and malaise, and greenish d/c, with mild ST and cough, but pt denies chest pain, wheezing, increased sob or doe, orthopnea, PND, increased LE swelling, palpitations, dizziness or syncope. No other new changes to hx Denies worsening depressive symptoms, suicidal ideation, or panic Past Medical History:  Diagnosis Date  . Anxiety 06/02/2010  . BACK PAIN 07/26/2007  . 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 Cervicalgia 07/26/2007  . 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 DEPRESSIVE DISORDER 07/26/2007  . Grave's disease    hx of Grave's Disease/Hyperthyroidism  . Hx of migraines   . Hyperlipidemia 12/17/2011  . Insomnia 06/02/2010  . Lumbar degenerative disc disease 01/20/2011  . Lump or mass in breast 11/14/2008  . Palpitations 09/21/2008  . UTI 05/11/2007   Past Surgical History:  Procedure Laterality Date  . BREAST BIOPSY    . CESAREAN SECTION  10/29/2008   twins  . DILITATION & CURRETTAGE/HYSTROSCOPY WITH VERSAPOINT RESECTION N/A 07/20/2012   Procedure: DILATATION & CURETTAGE/HYSTEROSCOPY WITH VERSAPOINT RESECTION;  Surgeon: Princess Bruins, MD;  Location: [facility];  Service: Gynecology;  Laterality: N/A;  . HYSTEROSCOPY W/D&C  07-20-12    reports that she has quit smoking. She has never used smokeless tobacco. She reports that she does not drink alcohol or use drugs. family history includes Hypertension in her mother. No Known Allergies Current Outpatient Prescriptions on File Prior to Visit  Medication Sig Dispense Refill  . atorvastatin (LIPITOR) 10 MG tablet Take 1 tablet (10 mg total) by mouth daily. 90 tablet 3  . Calcium 500 MG tablet Take 1,000 mg by mouth daily.    . clonazePAM (KLONOPIN) 0.5 MG tablet TAKE (1) TABLET TWICE A DAY AS NEEDED. 60 tablet 3  . COMBIPATCH  0.05-0.14 MG/DAY Place 1 patch onto the skin Every third day.    Marland Kitchen HYDROcodone-acetaminophen (NORCO) 7.5-325 MG tablet take 1 tablet by mouth every 12 hours if needed  0  . Multiple Vitamin (MULTIVITAMIN WITH MINERALS) TABS Take 1 tablet by mouth daily.    . Naproxen Sodium (NAPRELAN) 375 MG TB24 Take 1 tablet by mouth 2 (two) times daily as needed (for pain).     . OXYCONTIN 10 MG 12 hr tablet     . rizatriptan (MAXALT) 10 MG tablet Take 1 tablet (10 mg total) by mouth as needed for migraine. May repeat in 2 hours if needed 10 tablet 5  . ZOMIG 5 MG nasal solution   0   No current facility-administered medications on file prior to visit.    Review of Systems All otherwise neg per pt     Objective:   Physical Exam BP 122/80   Pulse 100   Temp 98.9 F (37.2 C) (Oral)   Resp 20   Wt 153 lb (69.4 kg)   SpO2 97%   BMI 23.96 kg/m  VS noted,  Constitutional: Pt appears in no apparent distress HENT: Head: NCAT.  Right Ear: External ear normal.  Left Ear: External ear normal.  Eyes: . Pupils are equal, round, and reactive to light. Conjunctivae and EOM are normal Bilat tm's with mild erythema.  Max sinus areas mild tender.  Pharynx with mild erythema, no exudate Neck: Normal range of motion. Neck supple.  Cardiovascular: Normal rate and regular rhythm.   Pulmonary/Chest: Effort  normal and breath sounds without rales or wheezing.  Neurological: Pt is alert. Not confused , motor grossly intact Skin: Skin is warm. No rash, no LE edema Psychiatric: Pt behavior is normal. No agitation.     Assessment & Plan:   

## 2015-12-19 NOTE — Patient Instructions (Signed)
Please take all new medication as prescribed - the antibiotic, and cough medicine if needed  Please continue all other medications as before, and refills have been done if requested.  Please have the pharmacy call with any other refills you may need.  Please keep your appointments with your specialists as you may have planned     

## 2015-12-19 NOTE — Progress Notes (Signed)
Pre visit review using our clinic review tool, if applicable. No additional management support is needed unless otherwise documented below in the visit note. 

## 2015-12-21 NOTE — Assessment & Plan Note (Signed)
Mild to mod, for antibx course,  to f/u any worsening symptoms or concerns 

## 2015-12-27 ENCOUNTER — Encounter: Payer: Self-pay | Admitting: Internal Medicine

## 2015-12-27 IMAGING — CR DG CERVICAL SPINE WITH FLEX & EXTEND
8 series · 8 of 8 positions shown · non-contrast
Comparison: None.

CLINICAL DATA: Neck pain.  Headaches for 3 years.  No trauma.

EXAM:
CERVICAL SPINE COMPLETE WITH FLEXION AND EXTENSION VIEWS

[view not recorded (1 of 8)]
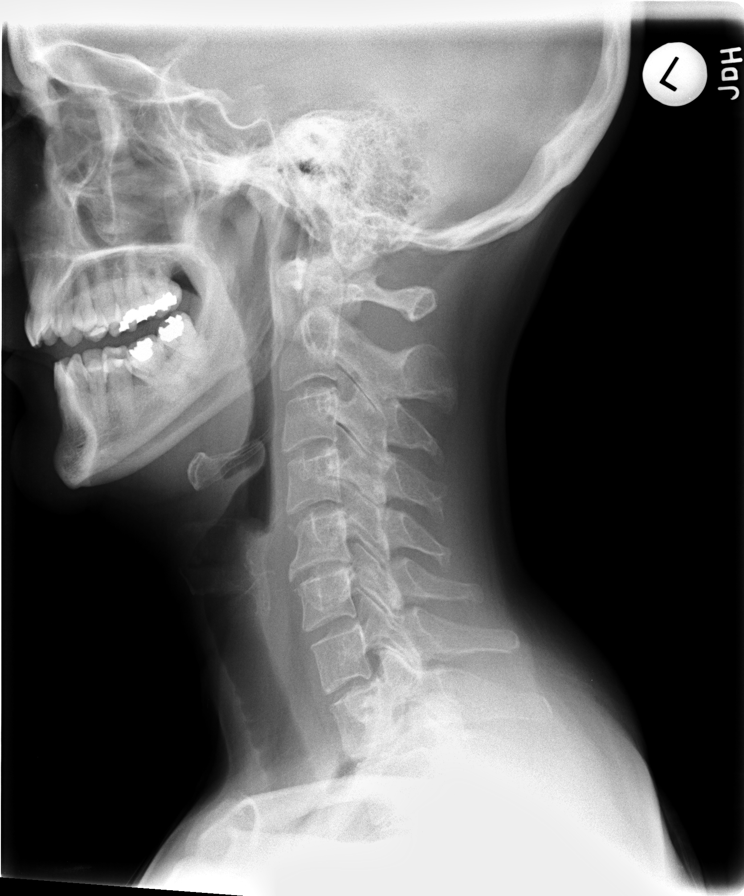

[view not recorded (2 of 8)]
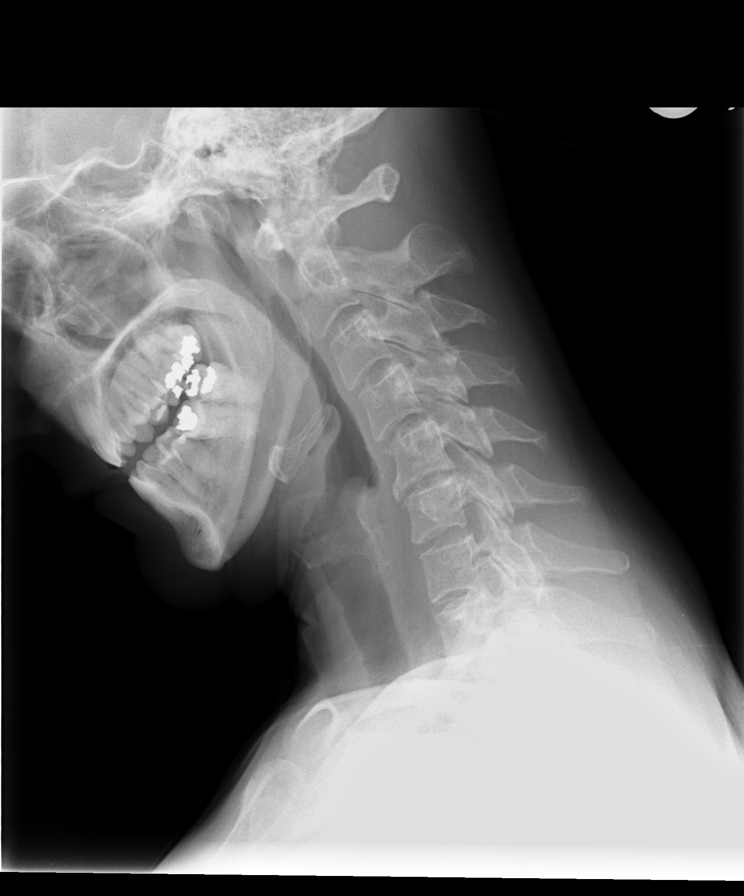

[view not recorded (3 of 8)]
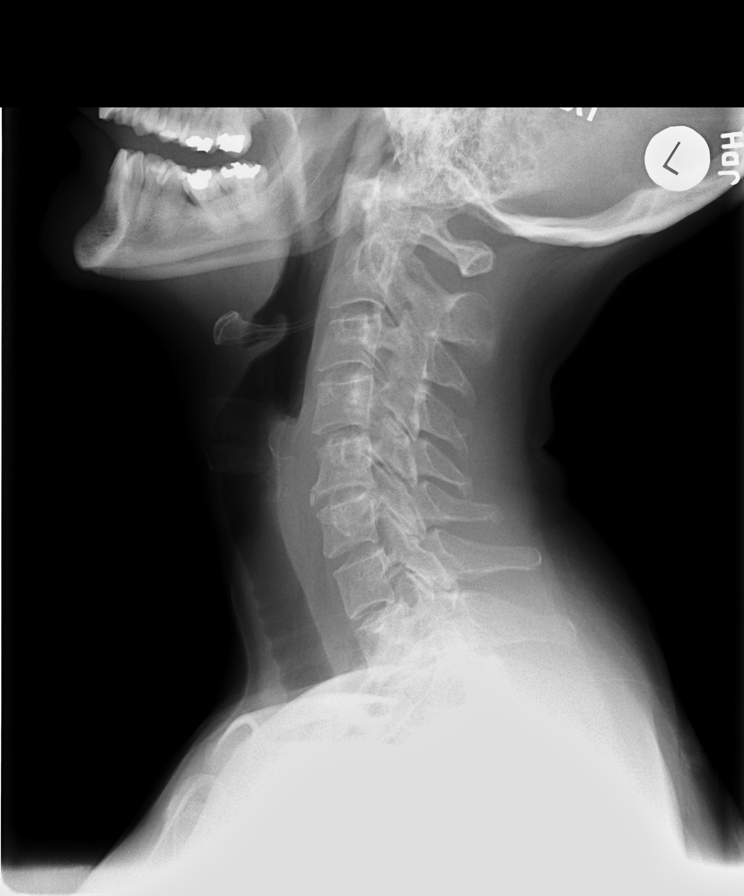

[view not recorded (4 of 8)]
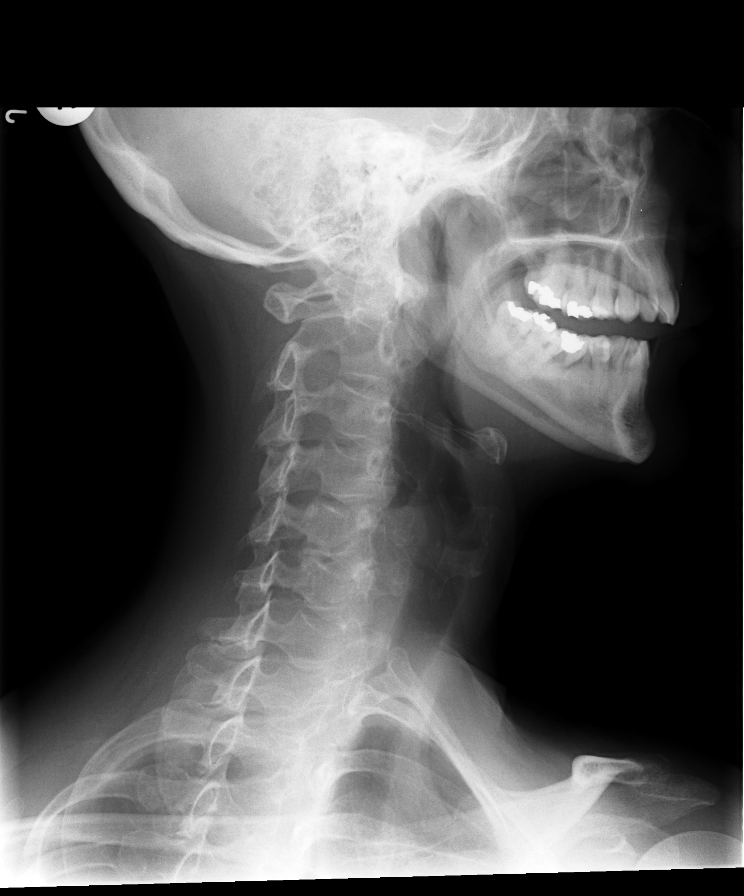

[view not recorded (5 of 8)]
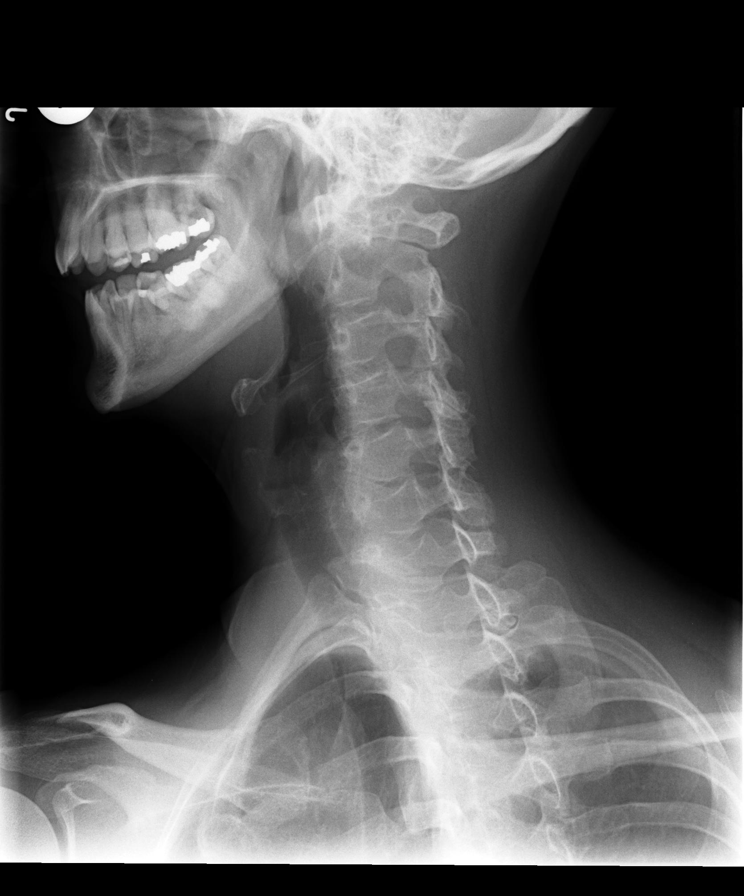

[view not recorded (6 of 8)]
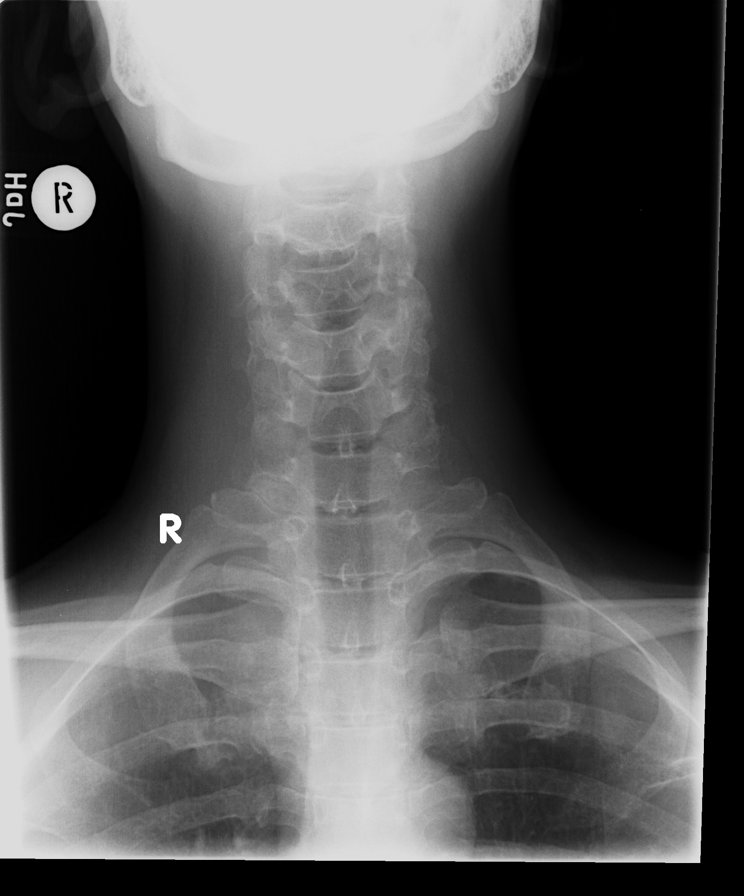

[view not recorded (7 of 8)]
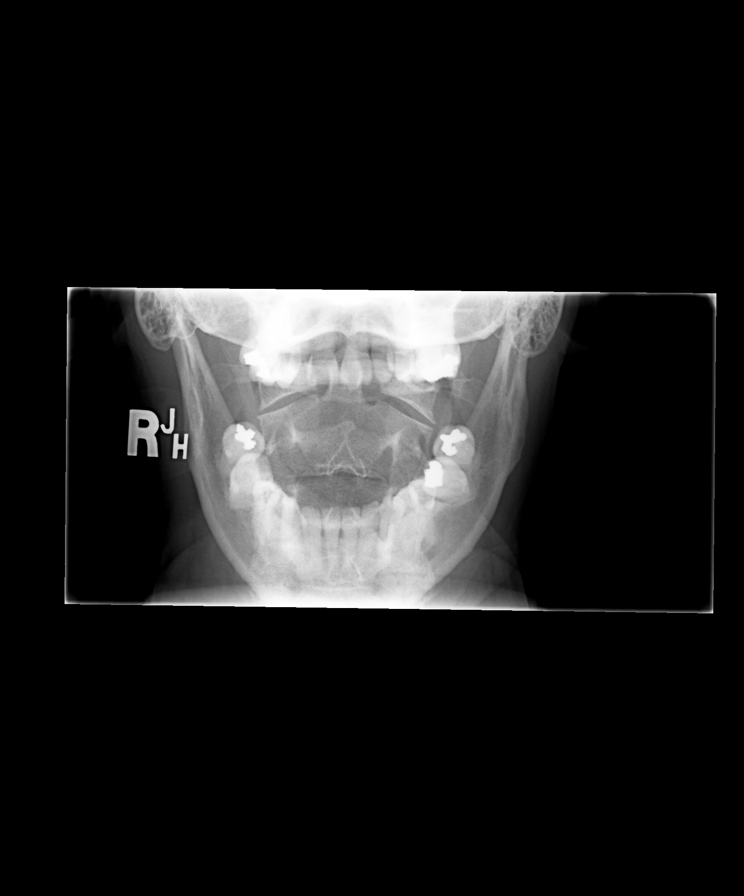

[view not recorded (8 of 8)]
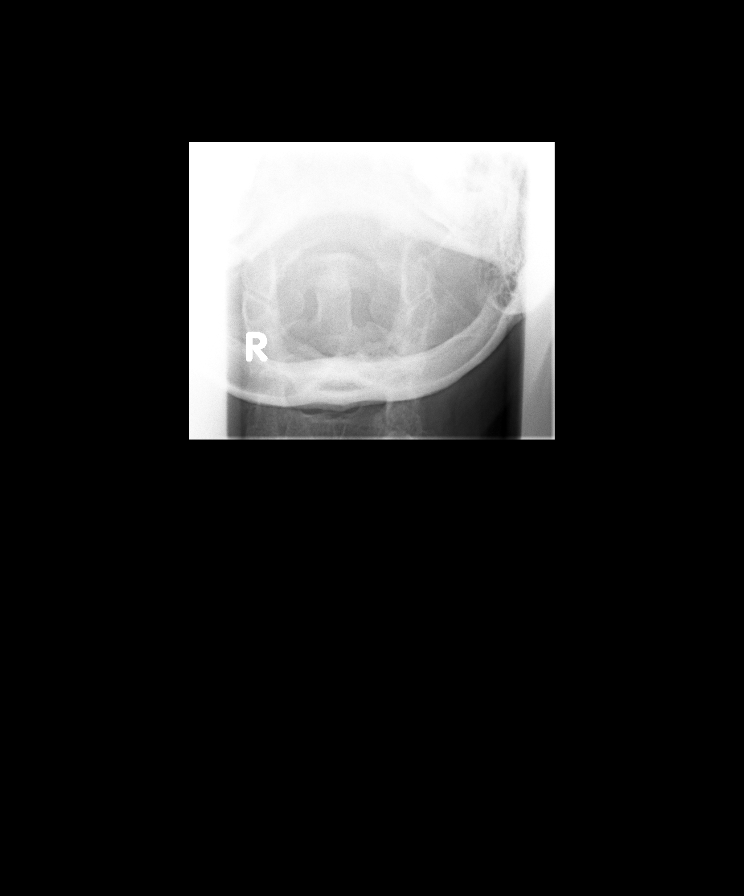

[8 of 8 positions shown; findings below may reference images not displayed]

FINDINGS: Vertebral body alignment and heights are normal. There is mild
spondylosis of the midcervical spine. There is minimal disc space
narrowing at the C5-6 level. Prevertebral soft tissues are normal.
No evidence of instability upon flexion extension. No significant
neural foraminal narrowing. Minimal uncovertebral joint spurring.
Atlantoaxial articulation is within normal. Mild facet arthropathy.
IMPRESSION: No acute findings.

Mild spondylosis with minimal disc disease at the C5-6 level. No
evidence of instability upon flexion and extension.

## 2015-12-27 NOTE — Telephone Encounter (Signed)
Will you call pt pls?

## 2015-12-28 ENCOUNTER — Ambulatory Visit (INDEPENDENT_AMBULATORY_CARE_PROVIDER_SITE_OTHER): Payer: BC Managed Care – PPO | Admitting: Family Medicine

## 2015-12-28 ENCOUNTER — Encounter: Payer: Self-pay | Admitting: Family Medicine

## 2015-12-28 VITALS — BP 100/70 | HR 81 | Temp 97.6°F | Resp 18 | Wt 152.5 lb

## 2015-12-28 DIAGNOSIS — B349 Viral infection, unspecified: Secondary | ICD-10-CM

## 2015-12-28 DIAGNOSIS — Z8709 Personal history of other diseases of the respiratory system: Secondary | ICD-10-CM | POA: Diagnosis not present

## 2015-12-28 LAB — POCT RAPID STREP A (OFFICE): Rapid Strep A Screen: NEGATIVE

## 2015-12-28 NOTE — Progress Notes (Signed)
   Subjective:    Patient ID: Cassandra Gallagher, female    DOB: Dec 12, 1960, 55 y.o.   MRN: [id number]  HPI URI- pt was seen on 11/9 after 2 weeks of sxs and started on Keflex TID x10 days.  Pt was improving on meds but again started to feel badly on Wednesday.  Yesterday felt 'terrible'.  'an overall whole body feeling of not feeling good'.  Pt reports 'a deep ache' in center of chest but denies CP, SOB.  2 daughters have + strep tests.  + nasal congestion, mild sore throat, some ear fullness.  Most bothersome is 'general fatigue'.  Pt has been taking Aleve and NSAIDs daily.  Denies increased heartburn.  'i just feel like the antibiotics were working and now they're not'  Review of Systems For ROS see HPI     Objective:   Physical Exam  Constitutional: She is oriented to person, place, and time. She appears well-developed and well-nourished. No distress.  HENT:  Head: Normocephalic and atraumatic.  Right Ear: Tympanic membrane normal.  Left Ear: Tympanic membrane normal.  Nose: Mucosal edema and rhinorrhea present. Right sinus exhibits no maxillary sinus tenderness and no frontal sinus tenderness. Left sinus exhibits no maxillary sinus tenderness and no frontal sinus tenderness.  Mouth/Throat: Uvula is midline and mucous membranes are normal. Posterior oropharyngeal erythema present. No oropharyngeal exudate.  Eyes: Conjunctivae and EOM are normal. Pupils are equal, round, and reactive to light.  Neck: Normal range of motion. Neck supple.  Cardiovascular: Normal rate, regular rhythm and normal heart sounds.   Pulmonary/Chest: Effort normal and breath sounds normal. No respiratory distress. She has no wheezes. She exhibits no tenderness (no TTP over sternum).  + dry cough  Lymphadenopathy:    She has no cervical adenopathy.  Neurological: She is alert and oriented to person, place, and time.  Skin: Skin is warm.  Psychiatric: She has a normal mood and affect. Her behavior is normal. Thought  content normal.  Vitals reviewed.         Assessment & Plan:  Viral illness- pt's complaints today are very vague and nonspecific and when I try and ask her to expand on 'not feeling well' she seems very frustrated with me, 'it's in the chart'.  She has no TTP over sternum and denies CP or SOB at current time.  Just keeps saying she felt 'terrible' yesterday and 'I don't feel well'.  No evidence of bacterial infxn on PE today- strep negative, lungs CTA, no TTP over sinuses, ears WNL.  No need for repeat abx at this time.  Reviewed supportive care and red flags that should prompt return- particularly chest pain if this changes.  Pt expressed understanding and is in agreement w/ plan.

## 2015-12-28 NOTE — Patient Instructions (Signed)
There is no evidence of bacterial infection on your exam today- strep test is negative, ears look great, no sinus tenderness, and lungs are clear- this is great news! I suspect your fatigue is due to your recent illness and the fact that you are recovering- and may have picked up a virus that is going around Drink plenty of fluids REST! Continue tylenol and ibuprofen for pain or fever Call with any questions or concerns- particularly if your pain is not improving Hang in there!!!

## 2015-12-28 NOTE — Progress Notes (Signed)
Pre-visit discussion using our clinic review tool. No additional management support is needed unless otherwise documented below in the visit note.  

## 2016-03-25 ENCOUNTER — Other Ambulatory Visit: Payer: Self-pay | Admitting: Internal Medicine

## 2016-03-25 DIAGNOSIS — Z1231 Encounter for screening mammogram for malignant neoplasm of breast: Secondary | ICD-10-CM

## 2016-04-01 ENCOUNTER — Other Ambulatory Visit: Payer: Self-pay | Admitting: Internal Medicine

## 2016-04-01 NOTE — Telephone Encounter (Signed)
Done hardcopy to [facility] 

## 2016-04-02 NOTE — Telephone Encounter (Signed)
RX faxed to [facility] 

## 2016-04-07 ENCOUNTER — Ambulatory Visit
Admission: RE | Admit: 2016-04-07 | Discharge: 2016-04-07 | Disposition: A | Discharge status: Home / Self Care | Payer: BC Managed Care – PPO | Source: Ambulatory Visit | Attending: Internal Medicine | Admitting: Internal Medicine

## 2016-04-07 DIAGNOSIS — Z1231 Encounter for screening mammogram for malignant neoplasm of breast: Secondary | ICD-10-CM

## 2016-04-09 ENCOUNTER — Other Ambulatory Visit: Payer: Self-pay | Admitting: Internal Medicine

## 2016-04-09 DIAGNOSIS — R928 Other abnormal and inconclusive findings on diagnostic imaging of breast: Secondary | ICD-10-CM

## 2016-04-14 ENCOUNTER — Ambulatory Visit
Admission: RE | Admit: 2016-04-14 | Discharge: 2016-04-14 | Disposition: A | Discharge status: Home / Self Care | Payer: BC Managed Care – PPO | Source: Ambulatory Visit | Attending: Internal Medicine | Admitting: Internal Medicine

## 2016-04-14 DIAGNOSIS — R928 Other abnormal and inconclusive findings on diagnostic imaging of breast: Secondary | ICD-10-CM

## 2016-05-12 ENCOUNTER — Other Ambulatory Visit: Payer: Self-pay | Admitting: Internal Medicine

## 2016-06-01 ENCOUNTER — Ambulatory Visit (INDEPENDENT_AMBULATORY_CARE_PROVIDER_SITE_OTHER): Payer: BC Managed Care – PPO | Admitting: Family Medicine

## 2016-06-01 VITALS — BP 128/78 | HR 75 | Temp 98.4°F | Resp 18 | Ht 67.0 in | Wt 154.4 lb

## 2016-06-01 DIAGNOSIS — Z87440 Personal history of urinary (tract) infections: Secondary | ICD-10-CM

## 2016-06-01 DIAGNOSIS — R350 Frequency of micturition: Secondary | ICD-10-CM

## 2016-06-01 LAB — POCT URINALYSIS DIP (MANUAL ENTRY)
Bilirubin, UA: NEGATIVE
Blood, UA: NEGATIVE
Glucose, UA: NEGATIVE mg/dL
Ketones, POC UA: NEGATIVE mg/dL
Leukocytes, UA: NEGATIVE
Nitrite, UA: NEGATIVE
Protein Ur, POC: NEGATIVE mg/dL
Spec Grav, UA: 1.02 (ref 1.010–1.025)
Urobilinogen, UA: 0.2 U/dL
pH, UA: 5 (ref 5.0–8.0)

## 2016-06-01 LAB — POC MICROSCOPIC URINALYSIS (UMFC): Mucus: ABSENT

## 2016-06-01 MED ORDER — NITROFURANTOIN MONOHYD MACRO 100 MG PO CAPS: 100.0000 mg | ORAL_CAPSULE | Freq: Two times a day (BID) | ORAL | 0 refills | Status: DC

## 2016-06-01 NOTE — Patient Instructions (Addendum)
Urine testing in the office was normal. This could either be from resolution of urinary tract infection with the antibiotics you took yesterday, or improving infection. Okay to wait on further antibiotics at this point, but if your symptoms are not continuing to improve tomorrow or Wednesday, start antibiotic. Return if worse or symptoms persist beyond antibiotic course.    Urinary Tract Infection, Adult A urinary tract infection (UTI) is an infection of any part of the urinary tract, which includes the kidneys, ureters, bladder, and urethra. These organs make, store, and get rid of urine in the body. UTI can be a bladder infection (cystitis) or kidney infection (pyelonephritis). What are the causes? This infection may be caused by fungi, viruses, or bacteria. Bacteria are the most common cause of UTIs. This condition can also be caused by repeated incomplete emptying of the bladder during urination. What increases the risk? This condition is more likely to develop if:  You ignore your need to urinate or hold urine for long periods of time.  You do not empty your bladder completely during urination.  You wipe back to front after urinating or having a bowel movement, if you are female.  You are uncircumcised, if you are female.  You are constipated.  You have a urinary catheter that stays in place (indwelling).  You have a weak defense (immune) system.  You have a medical condition that affects your bowels, kidneys, or bladder.  You have diabetes.  You take antibiotic medicines frequently or for long periods of time, and the antibiotics no longer work well against certain types of infections (antibiotic resistance).  You take medicines that irritate your urinary tract.  You are exposed to chemicals that irritate your urinary tract.  You are female. What are the signs or symptoms? Symptoms of this condition include:  Fever.  Frequent urination or passing small amounts of urine  frequently.  Needing to urinate urgently.  Pain or burning with urination.  Urine that smells bad or unusual.  Cloudy urine.  Pain in the lower abdomen or back.  Trouble urinating.  Blood in the urine.  Vomiting or being less hungry than normal.  Diarrhea or abdominal pain.  Vaginal discharge, if you are female. How is this diagnosed? This condition is diagnosed with a medical history and physical exam. You will also need to provide a urine sample to test your urine. Other tests may be done, including:  Blood tests.  Sexually transmitted disease (STD) testing. If you have had more than one UTI, a cystoscopy or imaging studies may be done to determine the cause of the infections. How is this treated? Treatment for this condition often includes a combination of two or more of the following:  Antibiotic medicine.  Other medicines to treat less common causes of UTI.  Over-the-counter medicines to treat pain.  Drinking enough water to stay hydrated. Follow these instructions at home:  Take over-the-counter and prescription medicines only as told by your health care provider.  If you were prescribed an antibiotic, take it as told by your health care provider. Do not stop taking the antibiotic even if you start to feel better.  Avoid alcohol, caffeine, tea, and carbonated beverages. They can irritate your bladder.  Drink enough fluid to keep your urine clear or pale yellow.  Keep all follow-up visits as told by your health care provider. This is important.  Make sure to:  Empty your bladder often and completely. Do not hold urine for long periods of time.  Empty your bladder before and after sex.  Wipe from front to back after a bowel movement if you are female. Use each tissue one time when you wipe. Contact a health care provider if:  You have back pain.  You have a fever.  You feel nauseous or vomit.  Your symptoms do not get better after 3 days.  Your  symptoms go away and then return. Get help right away if:  You have severe back pain or lower abdominal pain.  You are vomiting and cannot keep down any medicines or water. This information is not intended to replace advice given to you by your health care provider. Make sure you discuss any questions you have with your health care provider. Document Released: 11/05/2004 Document Revised: 07/10/2015 Document Reviewed: 12/17/2014 Elsevier Interactive Patient Education  2017 [employer].  Urinary Frequency, Adult Urinary frequency means urinating more often than usual. People with urinary frequency urinate at least 8 times in 24 hours, even if they drink a normal amount of fluid. Although they urinate more often than normal, the total amount of urine produced in a day may be normal. Urinary frequency is also called pollakiuria. What are the causes? This condition may be caused by:  A urinary tract infection.  Obesity.  Bladder problems, such as bladder stones.  Caffeine or alcohol.  Eating food or drinking fluids that irritate the bladder. These include coffee, tea, soda, artificial sweeteners, citrus, tomato-based foods, and chocolate.  Certain medicines, such as medicines that help the body get rid of extra fluid (diuretics).  Muscle or nerve weakness.  Overactive bladder.  Chronic diabetes.  Interstitial cystitis.  In men, problems with the prostate, such as an enlarged prostate.  In women, pregnancy. In some cases, the cause may not be known. What increases the risk? This condition is more likely to develop in:  Women who have gone through menopause.  Men with prostate problems.  People with a disease or injury that affects the nerves or spinal cord.  People who have or have had a condition that affects the brain, such as a stroke. What are the signs or symptoms? Symptoms of this condition include:  Feeling an urgent need to urinate often. The stress and  anxiety of needing to find a bathroom quickly can make this urge worse.  Urinating 8 or more times in 24 hours.  Urinating as often as every 1 to 2 hours. How is this diagnosed? This condition is diagnosed based on your symptoms, your medical history, and a physical exam. You may have tests, such as:  Blood tests.  Urine tests.  Imaging tests, such as X-rays or ultrasounds.  A bladder test.  A test of your neurological system. This is the body system that senses the need to urinate.  A test to check for problems in the urethra and bladder called cystoscopy. You may also be asked to keep a bladder diary. A bladder diary is a record of what you eat and drink, how often you urinate, and how much you urinate. You may need to see a health care provider who specializes in conditions of the urinary tract (urologist) or kidneys (nephrologist). How is this treated? Treatment for this condition depends on the cause. Sometimes the condition goes away on its own and treatment is not necessary. If treatment is needed, it may include:  Taking medicine.  Learning exercises that strengthen the muscles that help control urination.  Following a bladder training program. This may include:  Learning  to delay going to the bathroom.  Double urinating (voiding). This helps if you are not completely emptying your bladder.  Scheduled voiding.  Making diet changes, such as:  Avoiding caffeine.  Drinking fewer fluids, especially alcohol.  Not drinking in the evening.  Not having foods or drinks that may irritate the bladder.  Eating foods that help prevent or ease constipation. Constipation can make this condition worse.  Having the nerves in your bladder stimulated. There are two options for stimulating the nerves to your bladder:  Outpatient electrical nerve stimulation. This is done by your health care provider.  Surgery to implant a bladder pacemaker. The pacemaker helps to control the  urge to urinate. Follow these instructions at home:  Keep a bladder diary if told to by your health care provider.  Take over-the-counter and prescription medicines only as told by your health care provider.  Do any exercises as told by your health care provider.  Follow a bladder training program as told by your health care provider.  Make any recommended diet changes.  Keep all follow-up visits as told by your health care provider. This is important. Contact a health care provider if:  You start urinating more often.  You feel pain or irritation when you urinate.  You notice blood in your urine.  Your urine looks cloudy.  You develop a fever.  You begin vomiting. Get help right away if:  You are unable to urinate. This information is not intended to replace advice given to you by your health care provider. Make sure you discuss any questions you have with your health care provider. Document Released: 11/22/2008 Document Revised: 02/27/2015 Document Reviewed: 08/22/2014 Elsevier Interactive Patient Education  2017 [employer].      IF you received an x-ray today, you will receive an invoice from [facility]. Please contact [facility] at [phone] with questions or concerns regarding your invoice.   IF you received labwork today, you will receive an invoice from [facility]. Please contact [facility] at [phone] with questions or concerns regarding your invoice.   Our billing staff will not be able to assist you with questions regarding bills from these companies.  You will be contacted with the lab results as soon as they are available. The fastest way to get your results is to activate your My Chart account. Instructions are located on the last page of this paperwork. If you have not heard from [place] regarding the results in 2 weeks, please contact this office.

## 2016-06-01 NOTE — Progress Notes (Signed)
By signing my name below, I, Mesha Guinyard, attest that this documentation has been prepared under the direction and in the presence of Merri Ray, MD.  Electronically Signed: Verlee Monte, Medical Scribe. 06/01/16. 2:54 PM.  Subjective:    Patient ID: Cassandra Gallagher, female    DOB: April 11, 1960, 56 y.o.   MRN: [id number]  HPI Chief Complaint  Patient presents with  . Cystitis    x1day  . Dysuria  . Urinary Frequency    HPI Comments: Cassandra Gallagher is a 56 y.o. female who presents to the Primary Care at [facility] and [facility] complaining of dysuria onset yesterday. Reports associated sxs of urinary frequency. Pt took 2 leftover erythromycin yesterday with min relief of her sxs and drank a lot of water with some relief of her sxs. Pt has had UTIs in the past with similar sxs and suspects this is the same. Denies fever, nausea, vomiting, or back pain.  Patient Active Problem List   Diagnosis Date Noted  . Acute pharyngitis 07/10/2015  . Hair loss 05/06/2015  . Lumbosacral spondylosis without myelopathy 06/30/2013  . Chronic headache disorder 05/30/2013  . Cervico-occipital neuralgia 05/04/2013  . Cephalalgia 05/04/2013  . Cervical pain 05/04/2013  . Chronic migraine 02/23/2012  . Hyperlipidemia 12/17/2011  . Cervical spondylosis without myelopathy 10/30/2011  . Acute upper respiratory infection 04/09/2011  . 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 Lumbar degenerative disc disease 01/20/2011  . Chronic pain 09/12/2010  . Anxiety 06/02/2010  . Insomnia 06/02/2010  . Preventative health care 06/01/2010  . Lump or mass in breast 11/14/2008  . Depression 07/26/2007   Past Medical History:  Diagnosis Date  . Anxiety 06/02/2010  . BACK PAIN 07/26/2007  . 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 Cervicalgia 07/26/2007  . 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 DEPRESSIVE DISORDER 07/26/2007  . Grave's disease    hx of Grave's  Disease/Hyperthyroidism  . Hx of migraines   . Hyperlipidemia 12/17/2011  . Insomnia 06/02/2010  . Lumbar degenerative disc disease 01/20/2011  . Lump or mass in breast 11/14/2008  . Palpitations 09/21/2008  . UTI 05/11/2007   Past Surgical History:  Procedure Laterality Date  . BREAST BIOPSY    . CESAREAN SECTION  10/29/2008   twins  . DILITATION & CURRETTAGE/HYSTROSCOPY WITH VERSAPOINT RESECTION N/A 07/20/2012   Procedure: DILATATION & CURETTAGE/HYSTEROSCOPY WITH VERSAPOINT RESECTION;  Surgeon: Princess Bruins, MD;  Location: [facility];  Service: Gynecology;  Laterality: N/A;  . HYSTEROSCOPY W/D&C  07-20-12   No Known Allergies Prior to Admission medications   Medication Sig Start Date End Date Taking? Authorizing Provider  Calcium 500 MG tablet Take 1,000 mg by mouth daily.   Yes Historical Provider, MD  clonazePAM (KLONOPIN) 0.5 MG tablet TAKE (1) TABLET TWICE A DAY AS NEEDED. 04/01/16  Yes Biagio Borg, MD  COMBIPATCH 0.05-0.14 MG/DAY Place 1 patch onto the skin Every third day. 11/08/11  Yes Historical Provider, MD  HYDROcodone-acetaminophen (NORCO) 7.5-325 MG tablet take 1 tablet by mouth every 12 hours if needed 04/15/15  Yes Historical Provider, MD  OXYCONTIN 10 MG 12 hr tablet  04/30/15  Yes Historical Provider, MD  rizatriptan (MAXALT) 10 MG tablet Take 1 tablet (10 mg total) by mouth as needed for migraine. May repeat in 2 hours if needed 05/28/15  Yes Biagio Borg, MD  ZOMIG 5 MG nasal solution  04/12/15  Yes Historical Provider, MD  atorvastatin (LIPITOR) 10 MG tablet take 1 tablet by mouth  once daily Patient not taking: Reported on 06/01/2016 05/12/16   Biagio Borg, MD  Multiple Vitamin (MULTIVITAMIN WITH MINERALS) TABS Take 1 tablet by mouth daily.    Historical Provider, MD  Naproxen Sodium (NAPRELAN) 375 MG TB24 Take 1 tablet by mouth 2 (two) times daily as needed (for pain).     Historical Provider, MD   Social History   Social History  . Marital status: Significant Other     Spouse name: N/A  . Number of children: 2  . 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 Smoking status: Former Research scientist (life sciences)  . Smokeless tobacco: Never Used     Comment: smoked for 6 years, quit 15 years ago  . Alcohol use No  . Drug use: No  . Sexual activity: Not on file   Other Topics Concern  . Not on file   Social History Narrative   2nd child born recently in September 2010.   Married, one child from previous marriage lives in [place]   Review of Systems  Constitutional: Negative for fever.  Gastrointestinal: Negative for nausea and vomiting.  Genitourinary: Positive for dysuria and frequency.  Musculoskeletal: Negative for back pain.   Objective:   Physical Exam  Constitutional: She appears well-developed and well-nourished. No distress.  HENT:  Head: Normocephalic and atraumatic.  Eyes: Conjunctivae are normal.  Neck: Neck supple.  Cardiovascular: Normal rate, regular rhythm and normal heart sounds.  Exam reveals no gallop and no friction rub.   No murmur heard. Pulmonary/Chest: Effort normal and breath sounds normal. No respiratory distress. She has no wheezes. She has no rales.  Neurological: She is alert.  Skin: Skin is warm and dry.  Psychiatric: She has a normal mood and affect. Her behavior is normal.  Nursing note and vitals reviewed.   Vitals:   06/01/16 1433  BP: 128/78  Pulse: 75  Resp: 18  Temp: 98.4 F (36.9 C)  TempSrc: Oral  SpO2: 98%  Weight: 154 lb 6.4 oz (70 kg)  Height: 5\' 7"  (1.702 m)  Body mass index is 24.18 kg/m.   Results for orders placed or performed in visit on 06/01/16  POCT urinalysis dipstick  Result Value Ref Range   Color, UA yellow yellow   Clarity, UA clear clear   Glucose, UA negative negative mg/dL   Bilirubin, UA negative negative   Ketones, POC UA negative negative mg/dL   Spec Grav, UA 1.020 1.010 - 1.025   Blood, UA negative negative   pH, UA 5.0  5.0 - 8.0   Protein Ur, POC negative negative mg/dL   Urobilinogen, UA 0.2 0.2 or 1.0 E.U./dL   Nitrite, UA Negative Negative   Leukocytes, UA Negative Negative  POCT Microscopic Urinalysis ([facility])  Result Value Ref Range   WBC,UR,HPF,POC None None WBC/hpf   RBC,UR,HPF,POC None None RBC/hpf   Bacteria Few (A) None, Too numerous to count   Mucus Absent Absent   Epithelial Cells, UR Per Microscopy Few (A) None, Too numerous to count cells/hpf   Assessment & Plan:    Cassandra Gallagher is a 56 y.o. female Urinary frequency - Plan: POCT urinalysis dipstick, POCT Microscopic Urinalysis ([facility]), nitrofurantoin, macrocrystal-monohydrate, (MACROBID) 100 MG capsule, Urine culture  History of urinary infection - Plan: nitrofurantoin, macrocrystal-monohydrate, (MACROBID) 100 MG capsule, Urine culture  Urinalysis appears normal. However took 2 antibiotics prior to testing.  -Discussed waiting for further antibiotics and if symptoms  not continuing to improve, can start Macrobid in the next day or 2 (rx printed)  - RTC precautions.   -Will check urine culture, but again this may not be helpful with prior antibiotic usage.  Meds ordered this encounter  Medications  . nitrofurantoin, macrocrystal-monohydrate, (MACROBID) 100 MG capsule    Sig: Take 1 capsule (100 mg total) by mouth 2 (two) times daily.    Dispense:  14 capsule    Refill:  0   Patient Instructions    Urine testing in the office was normal. This could either be from resolution of urinary tract infection with the antibiotics you took yesterday, or improving infection. Okay to wait on further antibiotics at this point, but if your symptoms are not continuing to improve tomorrow or Wednesday, start antibiotic. Return if worse or symptoms persist beyond antibiotic course.    Urinary Tract Infection, Adult A urinary tract infection (UTI) is an infection of any part of the urinary tract, which includes the kidneys, ureters, bladder, and  urethra. These organs make, store, and get rid of urine in the body. UTI can be a bladder infection (cystitis) or kidney infection (pyelonephritis). What are the causes? This infection may be caused by fungi, viruses, or bacteria. Bacteria are the most common cause of UTIs. This condition can also be caused by repeated incomplete emptying of the bladder during urination. What increases the risk? This condition is more likely to develop if:  You ignore your need to urinate or hold urine for long periods of time.  You do not empty your bladder completely during urination.  You wipe back to front after urinating or having a bowel movement, if you are female.  You are uncircumcised, if you are female.  You are constipated.  You have a urinary catheter that stays in place (indwelling).  You have a weak defense (immune) system.  You have a medical condition that affects your bowels, kidneys, or bladder.  You have diabetes.  You take antibiotic medicines frequently or for long periods of time, and the antibiotics no longer work well against certain types of infections (antibiotic resistance).  You take medicines that irritate your urinary tract.  You are exposed to chemicals that irritate your urinary tract.  You are female. What are the signs or symptoms? Symptoms of this condition include:  Fever.  Frequent urination or passing small amounts of urine frequently.  Needing to urinate urgently.  Pain or burning with urination.  Urine that smells bad or unusual.  Cloudy urine.  Pain in the lower abdomen or back.  Trouble urinating.  Blood in the urine.  Vomiting or being less hungry than normal.  Diarrhea or abdominal pain.  Vaginal discharge, if you are female. How is this diagnosed? This condition is diagnosed with a medical history and physical exam. You will also need to provide a urine sample to test your urine. Other tests may be done, including:  Blood  tests.  Sexually transmitted disease (STD) testing. If you have had more than one UTI, a cystoscopy or imaging studies may be done to determine the cause of the infections. How is this treated? Treatment for this condition often includes a combination of two or more of the following:  Antibiotic medicine.  Other medicines to treat less common causes of UTI.  Over-the-counter medicines to treat pain.  Drinking enough water to stay hydrated. Follow these instructions at home:  Take over-the-counter and prescription medicines only as told by your health care provider.  If you were prescribed an antibiotic, take it as told by your health care provider. Do not stop taking the antibiotic even if you start to feel better.  Avoid alcohol, caffeine, tea, and carbonated beverages. They can irritate your bladder.  Drink enough fluid to keep your urine clear or pale yellow.  Keep all follow-up visits as told by your health care provider. This is important.  Make sure to:  Empty your bladder often and completely. Do not hold urine for long periods of time.  Empty your bladder before and after sex.  Wipe from front to back after a bowel movement if you are female. Use each tissue one time when you wipe. Contact a health care provider if:  You have back pain.  You have a fever.  You feel nauseous or vomit.  Your symptoms do not get better after 3 days.  Your symptoms go away and then return. Get help right away if:  You have severe back pain or lower abdominal pain.  You are vomiting and cannot keep down any medicines or water. This information is not intended to replace advice given to you by your health care provider. Make sure you discuss any questions you have with your health care provider. Document Released: 11/05/2004 Document Revised: 07/10/2015 Document Reviewed: 12/17/2014 Elsevier Interactive Patient Education  2017 [employer].  Urinary Frequency, Adult Urinary  frequency means urinating more often than usual. People with urinary frequency urinate at least 8 times in 24 hours, even if they drink a normal amount of fluid. Although they urinate more often than normal, the total amount of urine produced in a day may be normal. Urinary frequency is also called pollakiuria. What are the causes? This condition may be caused by:  A urinary tract infection.  Obesity.  Bladder problems, such as bladder stones.  Caffeine or alcohol.  Eating food or drinking fluids that irritate the bladder. These include coffee, tea, soda, artificial sweeteners, citrus, tomato-based foods, and chocolate.  Certain medicines, such as medicines that help the body get rid of extra fluid (diuretics).  Muscle or nerve weakness.  Overactive bladder.  Chronic diabetes.  Interstitial cystitis.  In men, problems with the prostate, such as an enlarged prostate.  In women, pregnancy. In some cases, the cause may not be known. What increases the risk? This condition is more likely to develop in:  Women who have gone through menopause.  Men with prostate problems.  People with a disease or injury that affects the nerves or spinal cord.  People who have or have had a condition that affects the brain, such as a stroke. What are the signs or symptoms? Symptoms of this condition include:  Feeling an urgent need to urinate often. The stress and anxiety of needing to find a bathroom quickly can make this urge worse.  Urinating 8 or more times in 24 hours.  Urinating as often as every 1 to 2 hours. How is this diagnosed? This condition is diagnosed based on your symptoms, your medical history, and a physical exam. You may have tests, such as:  Blood tests.  Urine tests.  Imaging tests, such as X-rays or ultrasounds.  A bladder test.  A test of your neurological system. This is the body system that senses the need to urinate.  A test to check for problems in the  urethra and bladder called cystoscopy. You may also be asked to keep a bladder diary. A bladder diary is a record of what you eat  and drink, how often you urinate, and how much you urinate. You may need to see a health care provider who specializes in conditions of the urinary tract (urologist) or kidneys (nephrologist). How is this treated? Treatment for this condition depends on the cause. Sometimes the condition goes away on its own and treatment is not necessary. If treatment is needed, it may include:  Taking medicine.  Learning exercises that strengthen the muscles that help control urination.  Following a bladder training program. This may include:  Learning to delay going to the bathroom.  Double urinating (voiding). This helps if you are not completely emptying your bladder.  Scheduled voiding.  Making diet changes, such as:  Avoiding caffeine.  Drinking fewer fluids, especially alcohol.  Not drinking in the evening.  Not having foods or drinks that may irritate the bladder.  Eating foods that help prevent or ease constipation. Constipation can make this condition worse.  Having the nerves in your bladder stimulated. There are two options for stimulating the nerves to your bladder:  Outpatient electrical nerve stimulation. This is done by your health care provider.  Surgery to implant a bladder pacemaker. The pacemaker helps to control the urge to urinate. Follow these instructions at home:  Keep a bladder diary if told to by your health care provider.  Take over-the-counter and prescription medicines only as told by your health care provider.  Do any exercises as told by your health care provider.  Follow a bladder training program as told by your health care provider.  Make any recommended diet changes.  Keep all follow-up visits as told by your health care provider. This is important. Contact a health care provider if:  You start urinating more  often.  You feel pain or irritation when you urinate.  You notice blood in your urine.  Your urine looks cloudy.  You develop a fever.  You begin vomiting. Get help right away if:  You are unable to urinate. This information is not intended to replace advice given to you by your health care provider. Make sure you discuss any questions you have with your health care provider. Document Released: 11/22/2008 Document Revised: 02/27/2015 Document Reviewed: 08/22/2014 Elsevier Interactive Patient Education  2017 [employer].      IF you received an x-ray today, you will receive an invoice from [facility]. Please contact [facility] at [phone] with questions or concerns regarding your invoice.   IF you received labwork today, you will receive an invoice from [facility]. Please contact [facility] at [phone] with questions or concerns regarding your invoice.   Our billing staff will not be able to assist you with questions regarding bills from these companies.  You will be contacted with the lab results as soon as they are available. The fastest way to get your results is to activate your My Chart account. Instructions are located on the last page of this paperwork. If you have not heard from [place] regarding the results in 2 weeks, please contact this office.       I personally performed the services described in this documentation, which was scribed in my presence. The recorded information has been reviewed and considered for accuracy and completeness, addended by me as needed, and agree with information above.  Signed,   Merri Ray, MD [facility] at [place].  06/01/16 3:05 PM

## 2016-06-02 LAB — URINE CULTURE: ORGANISM ID, BACTERIA: NO GROWTH

## 2016-07-28 ENCOUNTER — Other Ambulatory Visit: Payer: Self-pay | Admitting: Internal Medicine

## 2016-07-28 NOTE — Telephone Encounter (Signed)
Done hardcopy to [place]  

## 2016-07-28 NOTE — Telephone Encounter (Signed)
faxed

## 2016-09-17 ENCOUNTER — Other Ambulatory Visit: Payer: Self-pay | Admitting: Internal Medicine

## 2016-09-24 ENCOUNTER — Other Ambulatory Visit: Payer: Self-pay

## 2016-09-24 ENCOUNTER — Other Ambulatory Visit: Payer: Self-pay | Admitting: Internal Medicine

## 2016-09-24 DIAGNOSIS — N6489 Other specified disorders of breast: Secondary | ICD-10-CM

## 2016-09-29 NOTE — Progress Notes (Signed)
[facility] [address], [place] [address] Phone: [phone] Subjective:    I'm seeing this patient by the request  of:  Cassandra Borg, MD   CC: Neck pain  STM:[id number]  Cassandra Gallagher is a 56 y.o. female coming in with complaint of neck pain. Her pain started about 15 years ago due to a repetitive stress injury. She mostly has pain in the morning upon awakening. The pain does trigger migraines. No prior history of surgery. Denies any radiating symptoms. She is currently seeing a neurologist who performs nerve blocks in the back of her head in which she said does help. She is hoping to start physical therapy for additional benefit. Current neurologist believes that she has DDD.  Onset- 15 years ago Location- cervical spine Duration- intermittent Character- dull Aggravating factors- lying in certain positions Reliving factors-  Therapies tried- medications, epidurals, physical therapy Severity- 0/10    patient did have a cervical neck x-rays taken in 07/21/2013. These were manipulated visualized by me. Patient did have some spondylosis noted with degenerative disc disease mostly at C5-C6 level. Patient is even had a epidural done back in 2015 in the neck.  Past Medical History:  Diagnosis Date  . Anxiety 06/02/2010  . BACK PAIN 07/26/2007  . 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 Cervicalgia 07/26/2007  . 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 DEPRESSIVE DISORDER 07/26/2007  . Grave's disease    hx of Grave's Disease/Hyperthyroidism  . Hx of migraines   . Hyperlipidemia 12/17/2011  . Insomnia 06/02/2010  . Lumbar degenerative disc disease 01/20/2011  . Lump or mass in breast 11/14/2008  . Palpitations 09/21/2008  . UTI 05/11/2007   Past Surgical History:  Procedure Laterality Date  . BREAST BIOPSY    . CESAREAN SECTION  10/29/2008   twins  . DILITATION & CURRETTAGE/HYSTROSCOPY WITH VERSAPOINT RESECTION N/A 07/20/2012     Procedure: DILATATION & CURETTAGE/HYSTEROSCOPY WITH VERSAPOINT RESECTION;  Surgeon: Princess Bruins, MD;  Location: [facility];  Service: Gynecology;  Laterality: N/A;  . HYSTEROSCOPY W/D&C  07-20-12   Social History   Social History  . Marital status: Significant Other    Spouse name: N/A  . Number of children: 2  . 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 Smoking status: Former Research scientist (life sciences)  . Smokeless tobacco: Never Used     Comment: smoked for 6 years, quit 15 years ago  . Alcohol use No  . Drug use: No  . Sexual activity: Not on file   Other Topics Concern  . Not on file   Social History Narrative   2nd child born recently in September 2010.   Married, one child from previous marriage lives in [place]   No Known Allergies Family History  Problem Relation Age of Onset  . Hypertension Mother      Past medical history, social, surgical and family history all reviewed in electronic medical record.  No pertanent information unless stated regarding to the chief complaint.   Review of Systems:Review of systems updated and as accurate as of 09/29/16  No  visual changes, nausea, vomiting, diarrhea, constipation, dizziness, abdominal pain, skin rash, fevers, chills, night sweats, weight loss, swollen lymph nodes, body aches, joint swelling,  chest pain, shortness of breath, mood changes. Positive headaches, muscle aches  Objective  There were no vitals taken for this visit. Systems examined below as  of 09/29/16   General: No apparent distress alert and oriented x3 mood and affect normal, dressed appropriately.  HEENT: Pupils equal, extraocular movements intact  Respiratory: Patient's speak in full sentences and does not appear short of breath  Cardiovascular: No lower extremity edema, non tender, no erythema  Skin: Warm dry intact with no signs of infection or rash on extremities or on axial skeleton.  Abdomen:  Soft nontender  Neuro: Cranial nerves II through XII are intact, neurovascularly intact in all extremities with 2+ DTRs and 2+ pulses.  Lymph: No lymphadenopathy of posterior or anterior cervical chain or axillae bilaterally.  Gait normal with good balance and coordination.  MSK:  Non tender with full range of motion and good stability and symmetric strength and tone of shoulders, elbows, wrist, hip, knee and ankles bilaterally.  Neck: Inspection loss of lordosis with poor posture. No palpable stepoffs. Negative Spurling's maneuver. Lacks last 5 of side bending bilaterally and the last 10 of extension Grip strength and sensation normal in bilateral hands Strength good C4 to T1 distribution No sensory change to C4 to T1 Negative Hoffman sign bilaterally Reflexes normal Significant tightness in the trapezius.  Osteopathic findings Cervical C2 flexed rotated and side bent right C4 flexed rotated and side bent left C6 flexed rotated and side bent left T3 extended rotated and side bent right inhaled third rib T9 extended rotated and side bent left L2 flexed rotated and side bent right Sacrum right on right  [id number]; 15 additional minutes spent for Therapeutic exercises as stated in above notes.  This included exercises focusing on stretching, strengthening, with significant focus on eccentric aspects.   Long term goals include an improvement in range of motion, strength, endurance as well as avoiding reinjury. Patient's frequency would include in 1-2 times a day, 3-5 times a week for a duration of 6-12 weeks.Exercises that included:  Basic scapular stabilization to include adduction and depression of scapula Scaption, focusing on proper movement and good control Internal and External rotation utilizing a theraband, with elbow tucked at side entire time Rows with theraband    Proper technique shown and discussed handout in great detail with ATC.  All questions were discussed and answered.       Impression and Recommendations:     This case required medical decision making of moderate complexity.      Note: This dictation was prepared with Dragon dictation along with smaller phrase technology. Any transcriptional errors that result from this process are unintentional.

## 2016-09-30 ENCOUNTER — Encounter: Payer: Self-pay | Admitting: Family Medicine

## 2016-09-30 ENCOUNTER — Ambulatory Visit (INDEPENDENT_AMBULATORY_CARE_PROVIDER_SITE_OTHER): Payer: BC Managed Care – PPO | Admitting: Family Medicine

## 2016-09-30 VITALS — BP 118/80 | HR 68 | Ht 66.0 in | Wt 156.0 lb

## 2016-09-30 DIAGNOSIS — M542 Cervicalgia: Secondary | ICD-10-CM

## 2016-09-30 DIAGNOSIS — M999 Biomechanical lesion, unspecified: Secondary | ICD-10-CM

## 2016-09-30 DIAGNOSIS — M47812 Spondylosis without myelopathy or radiculopathy, cervical region: Secondary | ICD-10-CM | POA: Diagnosis not present

## 2016-09-30 MED ORDER — VITAMIN D (ERGOCALCIFEROL) 1.25 MG (50000 UNIT) PO CAPS: 50000.0000 [IU] | ORAL_CAPSULE | ORAL | 0 refills | Status: DC

## 2016-09-30 NOTE — Patient Instructions (Signed)
Good to see you.   Ice 20 minutes 2 times daily. Usually after activity and before bed. Exercises 3 times a week.   physical therapy will be calling you  Adjustable standing desk.  Once weekly vitamin D for 12 weeks.  Over the counter  Turmeric 500mg  twice daily  Tart cherry extract any dose at night  See me again in 4 weeks.

## 2016-09-30 NOTE — Assessment & Plan Note (Signed)
Patient does have some cervical spine degeneration I could be contributing to some of the discomfort and pain. We discussed icing regimen and home exercises. We discussed working ergonomics and patient will be given adjustable standing desk. Once weekly vitamin D for strength and endurance. Patient will try some over-the-counter medications. Do not feel that patient would want to further try different types of injections at this time. Attempted osteopathic manipulation. Follow-up again in 4 weeks.

## 2016-09-30 NOTE — Assessment & Plan Note (Signed)
Decision today to treat with OMT was based on Physical Exam  After verbal consent patient was treated with HVLA, ME, FPR techniques in cervical, thoracic, lumbar and sacral areas  Patient tolerated the procedure well with improvement in symptoms  Patient given exercises, stretches and lifestyle modifications  See medications in patient instructions if given  Patient will follow up in 4 weeks 

## 2016-10-19 ENCOUNTER — Other Ambulatory Visit: Payer: BC Managed Care – PPO

## 2016-10-19 ENCOUNTER — Inpatient Hospital Stay: Admission: RE | Admit: 2016-10-19 | Payer: BC Managed Care – PPO | Source: Ambulatory Visit

## 2016-10-27 NOTE — Progress Notes (Signed)
[name] [address], [place] [address] Phone: [phone] Subjective:    I'm seeing this patient by the request  of:  Biagio Borg, MD   CC: Neck pain f/u  [id number]  Cassandra Gallagher is a 56 y.o. female coming in with complaint of neck pain. 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 Patient was treated with some posture and ergonomics. Went to formal physical therapy. Patient statesShe never did start the physical therapy. Continues to have the discomfort. States that the manipulation only helped for one day. Patient was unable to start the exercises. States that they were confusing. Did get some of the over-the-counter medications.  Previous imaging:   patient did have a cervical neck x-rays taken in 07/21/2013. These were manipulated visualized by me. Patient did have some spondylosis noted with degenerative disc disease mostly at C5-C6 level. Patient is even had a epidural done back in 2015 in the neck. For her follow up today she says she still has pain in her neck.  Past Medical History:  Diagnosis Date  . Anxiety 06/02/2010  . BACK PAIN 07/26/2007  . 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 Cervicalgia 07/26/2007  . 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 DEPRESSIVE DISORDER 07/26/2007  . Grave's disease    hx of Grave's Disease/Hyperthyroidism  . Hx of migraines   . Hyperlipidemia 12/17/2011  . Insomnia 06/02/2010  . Lumbar degenerative disc disease 01/20/2011  . Lump or mass in breast 11/14/2008  . Palpitations 09/21/2008  . UTI 05/11/2007   Past Surgical History:  Procedure Laterality Date  . BREAST BIOPSY    . CESAREAN SECTION  10/29/2008   twins  . DILITATION & CURRETTAGE/HYSTROSCOPY WITH VERSAPOINT RESECTION N/A 07/20/2012   Procedure: DILATATION & CURETTAGE/HYSTEROSCOPY WITH VERSAPOINT RESECTION;  Surgeon: Princess Bruins, MD;  Location: [facility];  Service: Gynecology;   Laterality: N/A;  . HYSTEROSCOPY W/D&C  07-20-12   Social History   Social History  . Marital status: Significant Other    Spouse name: N/A  . Number of children: 2  . 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 Smoking status: Former Research scientist (life sciences)  . Smokeless tobacco: Never Used     Comment: smoked for 6 years, quit 15 years ago  . Alcohol use No  . Drug use: No  . Sexual activity: Not Asked   Other Topics Concern  . None   Social History Narrative   2nd child born recently in September 2010.   Married, one child from previous marriage lives in [place]   No Known Allergies Family History  Problem Relation Age of Onset  . Hypertension Mother      Past medical history, social, surgical and family history all reviewed in electronic medical record.  No pertanent information unless stated regarding to the chief complaint.   Review of Systems: No visual changes, nausea, vomiting, diarrhea, constipation, dizziness, abdominal pain, skin rash, fevers, chills, night sweats, weight loss, swollen lymph nodes, body aches, joint swelling,  chest pain, shortness of breath, mood changes.  Positive headache, muscle aches  Objective  Blood pressure 110/70, pulse 67, height 5\' 6"  (1.676 m), weight 158 lb (71.7 kg), SpO2 97 %. Systems examined below as of 10/28/16   Systems examined below as of 10/28/16 General: NAD A&O x3 mood, affect normal  HEENT: Pupils equal, extraocular movements intact no  nystagmus Respiratory: not short of breath at rest or with speaking Cardiovascular: No lower extremity edema, non tender Skin: Warm dry intact with no signs of infection or rash on extremities or on axial skeleton. Abdomen: Soft nontender, no masses Neuro: Cranial nerves  intact, neurovascularly intact in all extremities with 2+ DTRs and 2+ pulses. Lymph: No lymphadenopathy appreciated today  Gait normal with good balance and  coordination.  MSK: Non tender with full range of motion and good stability and symmetric strength and tone of shoulders, elbows, wrist,  knee hips and ankles bilaterally.   Neck: Inspection unremarkable. No palpable stepoffs. Negative Spurling's maneuver. Mild limitation in rotation bilaterally Grip strength and sensation normal in bilateral hands Strength good C4 to T1 distribution No sensory change to C4 to T1 Negative Hoffman sign bilaterally Reflexes normal Continued tightness in the trapezius bilaterally  Osteopathic findings C2 flexed rotated and side bent right C4 flexed rotated and side bent left C7 flexed rotated and side bent left T3 extended rotated and side bent right inhaled third rib T8 extended rotated and side bent left L3 flexed rotated and side bent right Sacrum right on right      Impression and Recommendations:     This case required medical decision making of moderate complexity.      Note: This dictation was prepared with Dragon dictation along with smaller phrase technology. Any transcriptional errors that result from this process are unintentional.

## 2016-10-28 ENCOUNTER — Encounter: Payer: Self-pay | Admitting: Family Medicine

## 2016-10-28 ENCOUNTER — Ambulatory Visit (INDEPENDENT_AMBULATORY_CARE_PROVIDER_SITE_OTHER): Payer: BC Managed Care – PPO | Admitting: Family Medicine

## 2016-10-28 VITALS — BP 110/70 | HR 67 | Ht 66.0 in | Wt 158.0 lb

## 2016-10-28 DIAGNOSIS — M47812 Spondylosis without myelopathy or radiculopathy, cervical region: Secondary | ICD-10-CM | POA: Diagnosis not present

## 2016-10-28 DIAGNOSIS — M999 Biomechanical lesion, unspecified: Secondary | ICD-10-CM | POA: Diagnosis not present

## 2016-10-28 NOTE — Assessment & Plan Note (Signed)
Patient is some degenerative changes of the neck but I do feel that most this is multifactorial and patient's secondary to her poor posture. We discussed with patient and I do feel that patient's will do very well with formal physical therapy and given the number to call. We discussed continuing the once weekly vitamin D. Patient given ergonomic changes that can be beneficial. Attempted manipulation again. Follow-up again in 4-6 weeks.

## 2016-10-28 NOTE — Assessment & Plan Note (Signed)
Decision today to treat with OMT was based on Physical Exam  After verbal consent patient was treated with HVLA, ME, FPR techniques in cervical, thoracic, lumbar and sacral areas  Patient tolerated the procedure well with improvement in symptoms  Patient given exercises, stretches and lifestyle modifications  See medications in patient instructions if given  Patient will follow up in 4 weeks 

## 2016-10-28 NOTE — Patient Instructions (Signed)
Good to see you  It will be a slow process [phone] for physical therapy  On wall with heels, butt shoulder and head touching for a goal of 5 minutes daily  I think the standing desk would be good as well.  Continue the vitamins See me again in 4 weeks.

## 2016-11-12 ENCOUNTER — Ambulatory Visit: Payer: BC Managed Care – PPO

## 2016-11-12 ENCOUNTER — Ambulatory Visit
Admission: RE | Admit: 2016-11-12 | Discharge: 2016-11-12 | Disposition: A | Discharge status: Home / Self Care | Payer: BC Managed Care – PPO | Source: Ambulatory Visit | Attending: Internal Medicine | Admitting: Internal Medicine

## 2016-11-12 DIAGNOSIS — N6489 Other specified disorders of breast: Secondary | ICD-10-CM

## 2016-11-24 ENCOUNTER — Encounter: Payer: Self-pay | Admitting: Obstetrics & Gynecology

## 2016-11-24 ENCOUNTER — Ambulatory Visit (INDEPENDENT_AMBULATORY_CARE_PROVIDER_SITE_OTHER): Payer: BC Managed Care – PPO | Admitting: Obstetrics & Gynecology

## 2016-11-24 VITALS — BP 128/84 | Ht 66.0 in | Wt 161.0 lb

## 2016-11-24 DIAGNOSIS — Z780 Asymptomatic menopausal state: Secondary | ICD-10-CM | POA: Diagnosis not present

## 2016-11-24 DIAGNOSIS — R928 Other abnormal and inconclusive findings on diagnostic imaging of breast: Secondary | ICD-10-CM | POA: Diagnosis not present

## 2016-11-24 DIAGNOSIS — R102 Pelvic and perineal pain: Secondary | ICD-10-CM | POA: Diagnosis not present

## 2016-11-24 NOTE — Progress Notes (Signed)
    Cassandra Gallagher April 10, 1960 [id number]        56 y.o.  [id number] Married  RP:  Left Pelvic Pain and discuss Mammo results   HPI:  C/O left pelvic pain x about 2 weeks, pain is mild to moderate, almost present all the time.  Menopausal x about 7 years, ran out of Combipatch recently, so no longer taking HRT.  No PMB.  BMs normal.  No UTI Sx.  Breasts wnl.  Had a f/u Dx Left mammo 11/12/2016 which she would like to discuss the results of.  Brother Dxed with Lung Ca recently, increasing patient's anxiety.  Past medical history,surgical history, problem list, medications, allergies, family history and social history were all reviewed and documented in the EPIC chart.  Directed ROS with pertinent positives and negatives documented in the history of present illness/assessment and plan.  Exam:  Vitals:   11/24/16 0923  BP: 128/84  Weight: 161 lb (73 kg)  Height: 5\' 6"  (1.676 m)   General appearance:  Normal  Abdomen:  Soft, no distention, no mass felt.  No abdominal, femoral or inguinal hernia.  No rebound.  Mildly tender to deep palpation in left pelvic area.  Gyn exam:  Vulva normal.  Bimanual exam:  Uterus AV, normal volume, mobile, NT.  No adnexal mass felt, NT.   Assessment/Plan:  55 y.o. [id number]   1. Pelvic pain in female No mass felt on exam.  R/O Left Ovarian pathology.  F/U Pelvic US.  2. Abnormal mammogram of left breast Dx Left Mammo for Left breast asymmetry was stable on 11/12/2016.  No nodule, no mass, no Ca++.  Breast density category B.  Breast exam today normal.  Reassured.  Will f/u with Mammo as recommended in 6 months.  3. Menopause present Recently d/ced HRT after about 7 years.  Minimal menopausal Sxs well tolerated.  Recommend not restarting at this time.  Referred for screening Colonoscopy.  Recommend Bone Density this year.  F/U Pelvic US and Annual/Gyn visit.  Counseling on above issues >50% x 25 minutes.  Princess Bruins MD, 9:39 AM 11/24/2016

## 2016-11-24 NOTE — Patient Instructions (Signed)
1. Pelvic pain in female No mass felt on exam.  R/O Left Ovarian pathology.  F/U Pelvic US.  2. Abnormal mammogram of left breast Dx Left Mammo for Left breast asymmetry was stable on 11/12/2016.  No nodule, no mass, no Ca++.  Breast density category B.  Breast exam today normal.  Reassured.  Will f/u with Mammo as recommended in 6 months.  3. Menopause present Recently d/ced HRT after about 7 years.  Minimal menopausal Sxs well tolerated.  Recommend not restarting at this time.  Referred for screening Colonoscopy.  Recommend Bone Density this year.  F/U Pelvic US and Annual/Gyn visit.  F[place]se, good to see you today!  See you again soon for your Pelvic US.

## 2016-11-25 ENCOUNTER — Telehealth: Payer: Self-pay | Admitting: *Deleted

## 2016-11-25 ENCOUNTER — Ambulatory Visit: Payer: BC Managed Care – PPO | Admitting: Family Medicine

## 2016-11-25 NOTE — Telephone Encounter (Signed)
Office notes faxed to Dr.Mann office they will contact pt to schedule.

## 2016-11-25 NOTE — Telephone Encounter (Signed)
-----   Message from Princess Bruins, MD sent at 11/24/2016  9:59 AM EDT ----- Regarding: Screening Colonoscopy No screening colono so far.  Please schedule with Dr Collene Mares.

## 2016-12-01 ENCOUNTER — Telehealth: Payer: Self-pay | Admitting: *Deleted

## 2016-12-01 DIAGNOSIS — R102 Pelvic and perineal pain: Secondary | ICD-10-CM

## 2016-12-01 NOTE — Telephone Encounter (Signed)
Recommend organizing Pelvic US elsewhere ASAP and a visit with me to discuss results after.

## 2016-12-01 NOTE — Telephone Encounter (Signed)
Pt was scheduled for Pelvic ultrasound on 12/03/16 @ 3:20pm, you will be in surgery all pm, appointment cancel, pt unable to come in next available appointment is Nov 1st due to being out of town this day. Pt said she is worried was seen on 11/24/16 due to pelvic pain, no mass noted on office note. Pt would like ultrasound sooner, Are you okay with patient waiting to later nov or schedule else where? Please advise

## 2016-12-01 NOTE — Telephone Encounter (Signed)
Patient informed, order placed at [facility] hospital to schedule and pt aware to schedule visit after to discuss results.

## 2016-12-02 ENCOUNTER — Encounter: Payer: Self-pay | Admitting: *Deleted

## 2016-12-02 NOTE — Telephone Encounter (Signed)
Pt scheduled with Dr.Mann @ 2:00pm sent pt my chart message regarding this.

## 2016-12-03 ENCOUNTER — Ambulatory Visit: Payer: BC Managed Care – PPO | Admitting: Obstetrics & Gynecology

## 2016-12-03 ENCOUNTER — Other Ambulatory Visit: Payer: BC Managed Care – PPO

## 2016-12-04 ENCOUNTER — Ambulatory Visit (INDEPENDENT_AMBULATORY_CARE_PROVIDER_SITE_OTHER): Payer: BC Managed Care – PPO | Admitting: Family Medicine

## 2016-12-04 ENCOUNTER — Encounter: Payer: Self-pay | Admitting: Family Medicine

## 2016-12-04 VITALS — BP 100/60 | HR 82 | Ht 66.0 in | Wt 162.0 lb

## 2016-12-04 DIAGNOSIS — M47812 Spondylosis without myelopathy or radiculopathy, cervical region: Secondary | ICD-10-CM

## 2016-12-04 DIAGNOSIS — M999 Biomechanical lesion, unspecified: Secondary | ICD-10-CM | POA: Diagnosis not present

## 2016-12-04 MED ORDER — DICLOFENAC SODIUM 75 MG PO TBEC: 75.0000 mg | DELAYED_RELEASE_TABLET | Freq: Two times a day (BID) | ORAL | 3 refills | Status: DC

## 2016-12-04 NOTE — Progress Notes (Signed)
[facility] [address], [place] [address] Phone: [phone] Subjective:    I'm seeing this patient by the request  of:    CC: Neck pain follow-up  [id number]  Cassandra Gallagher is a 56 y.o. female coming in with complaint of neck and upper back pain. Has been seen previously and does have degenerative disc disease of the cervical spine. Has been doing relatively well with conservative therapy. Was not making much improvement but did start with formal physical therapy. Patient is excited about this. Did find the diclofenac has been helpful from her sister. Patient denies any radiation down the arms. States that it is seems to be more tolerable than usual.     Past Medical History:  Diagnosis Date  . Anxiety 06/02/2010  . BACK PAIN 07/26/2007  . 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 Cervicalgia 07/26/2007  . 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 DEPRESSIVE DISORDER 07/26/2007  . Grave's disease    hx of Grave's Disease/Hyperthyroidism  . Hx of migraines   . Hyperlipidemia 12/17/2011  . Insomnia 06/02/2010  . Lumbar degenerative disc disease 01/20/2011  . Lump or mass in breast 11/14/2008  . Palpitations 09/21/2008  . UTI 05/11/2007   Past Surgical History:  Procedure Laterality Date  . BREAST BIOPSY    . CESAREAN SECTION  10/29/2008   twins  . DILITATION & CURRETTAGE/HYSTROSCOPY WITH VERSAPOINT RESECTION N/A 07/20/2012   Procedure: DILATATION & CURETTAGE/HYSTEROSCOPY WITH VERSAPOINT RESECTION;  Surgeon: Princess Bruins, MD;  Location: [facility];  Service: Gynecology;  Laterality: N/A;  . HYSTEROSCOPY W/D&C  07-20-12   Social History   Social History  . Marital status: Married    Spouse name: N/A  . Number of children: 2  . 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 Smoking status: Former Smoker    Quit date: 11/24/1996  . Smokeless tobacco: Never Used      Comment: smoked for 6 years, quit 15 years ago  . Alcohol use No  . Drug use: No  . Sexual activity: Yes   Other Topics Concern  . None   Social History Narrative   2nd child born recently in September 2010.   Married, one child from previous marriage lives in [place]   No Known Allergies Family History  Problem Relation Age of Onset  . Hypertension Mother   . Breast cancer Mother   . Cancer Father        lung  . Cancer Brother        lung   . Breast cancer Maternal Aunt      Past medical history, social, surgical and family history all reviewed in electronic medical record.  No pertanent information unless stated regarding to the chief complaint.   Review of Systems:Review of systems updated and as accurate as of 12/04/16  No headache, visual changes, nausea, vomiting, diarrhea, constipation, dizziness, abdominal pain, skin rash, fevers, chills, night sweats, weight loss, swollen lymph nodes, body aches, joint swelling, chest pain, shortness of breath, mood changes. Positive muscle aches  Objective  Blood pressure 100/60, pulse 82, height 5\' 6"  (1.676 m), weight 162 lb (73.5 kg), SpO2 96 %. Systems examined below as of 12/04/16   General: No apparent distress alert and oriented x3 mood and affect normal, dressed appropriately.  HEENT: Pupils equal, extraocular movements intact  Respiratory: Patient's speak in full sentences and does  not appear short of breath  Cardiovascular: No lower extremity edema, non tender, no erythema  Skin: Warm dry intact with no signs of infection or rash on extremities or on axial skeleton.  Abdomen: Soft nontender  Neuro: Cranial nerves II through XII are intact, neurovascularly intact in all extremities with 2+ DTRs and 2+ pulses.  Lymph: No lymphadenopathy of posterior or anterior cervical chain or axillae bilaterally.  Gait normal with good balance and coordination.  MSK:  Non tender with full range of motion and good stability and  symmetric strength and tone of shoulders, elbows, wrist, hip, knee and ankles bilaterally. Neck: Inspection mild loss of lordosis. No palpable stepoffs. Negative Spurling's maneuver. Mild limitation lacking the last 10 of rotation to the left side bending Grip strength and sensation normal in bilateral hands Strength good C4 to T1 distribution No sensory change to C4 to T1 Negative Hoffman sign bilaterally Reflexes normal  Tightness of the trapezius bilaterally  Osteopathic findings C2 flexed rotated and side bent right C4 flexed rotated and side bent left C7 flexed rotated and side bent left T3 extended rotated and side bent right inhaled third rib T9 extended rotated and side bent left L2 flexed rotated and side bent right Sacrum right on right    Impression and Recommendations:     This case required medical decision making of moderate complexity.      Note: This dictation was prepared with Dragon dictation along with smaller phrase technology. Any transcriptional errors that result from this process are unintentional.

## 2016-12-04 NOTE — Assessment & Plan Note (Signed)
Patient has x-rays showing this. Now the patient has become more and compliant with the exercises I do think patient will make some improvement. Encourage her to continue with the physical therapy. Patient given prescription for diclofenac and we'll see if this will be beneficial. We discussed icing regimen and home exercises. We discussed which activities to continue to do. Ergonomics at work. Follow-up again in 6 weeks

## 2016-12-04 NOTE — Patient Instructions (Addendum)
Good to see you  Try the diclofenac pills  Exercises 3 times a week.  Continue with PT  See me again in 6 weeks.

## 2016-12-04 NOTE — Telephone Encounter (Signed)
Pt scheduled on 12/07/16 @ 1pm

## 2016-12-04 NOTE — Assessment & Plan Note (Signed)
Decision today to treat with OMT was based on Physical Exam  After verbal consent patient was treated with HVLA, ME, FPR techniques in cervical, thoracic, lumbar and sacral areas  Patient tolerated the procedure well with improvement in symptoms  Patient given exercises, stretches and lifestyle modifications  See medications in patient instructions if given  Patient will follow up in 6 weeks 

## 2016-12-07 ENCOUNTER — Ambulatory Visit (HOSPITAL_COMMUNITY)
Admission: RE | Admit: 2016-12-07 | Discharge: 2016-12-07 | Disposition: A | Discharge status: Home / Self Care | Payer: BC Managed Care – PPO | Source: Ambulatory Visit | Attending: Obstetrics & Gynecology | Admitting: Obstetrics & Gynecology

## 2016-12-07 DIAGNOSIS — R102 Pelvic and perineal pain: Secondary | ICD-10-CM

## 2016-12-08 ENCOUNTER — Other Ambulatory Visit: Payer: Self-pay | Admitting: Emergency Medicine

## 2016-12-10 ENCOUNTER — Other Ambulatory Visit: Payer: BC Managed Care – PPO

## 2016-12-10 ENCOUNTER — Ambulatory Visit (INDEPENDENT_AMBULATORY_CARE_PROVIDER_SITE_OTHER): Payer: BC Managed Care – PPO | Admitting: Internal Medicine

## 2016-12-10 ENCOUNTER — Encounter: Payer: Self-pay | Admitting: Internal Medicine

## 2016-12-10 VITALS — BP 120/78 | HR 83 | Temp 97.9°F | Ht 66.0 in | Wt 157.0 lb

## 2016-12-10 DIAGNOSIS — R1030 Lower abdominal pain, unspecified: Secondary | ICD-10-CM

## 2016-12-10 DIAGNOSIS — R109 Unspecified abdominal pain: Secondary | ICD-10-CM | POA: Insufficient documentation

## 2016-12-10 DIAGNOSIS — F419 Anxiety disorder, unspecified: Secondary | ICD-10-CM

## 2016-12-10 LAB — POCT URINALYSIS DIPSTICK
Bilirubin, UA: NEGATIVE
Blood, UA: NEGATIVE
Glucose, UA: NEGATIVE
Ketones, UA: NEGATIVE
NITRITE UA: NEGATIVE
PROTEIN UA: NEGATIVE
Spec Grav, UA: 1.015 (ref 1.010–1.025)
UROBILINOGEN UA: 0.2 U/dL
pH, UA: 6.5 (ref 5.0–8.0)

## 2016-12-10 MED ORDER — CIPROFLOXACIN HCL 500 MG PO TABS: 500.0000 mg | ORAL_TABLET | Freq: Two times a day (BID) | ORAL | 0 refills | Status: AC

## 2016-12-10 MED ORDER — METRONIDAZOLE 250 MG PO TABS: 250.0000 mg | ORAL_TABLET | Freq: Three times a day (TID) | ORAL | 0 refills | Status: AC

## 2016-12-10 NOTE — Patient Instructions (Addendum)
Please take all new medication as prescribed - the antibiotics  Your specimen has been sent for culture, which normally takes 2-3 days to return results  Please call Monday if you are not improved, to consider a CT scan to be done  Please continue all other medications as before, and refills have been done if requested.  Please have the pharmacy call with any other refills you may need  Please keep your appointments with your specialists as you may have planned - Dr Collene Mares for next Friday

## 2016-12-10 NOTE — Progress Notes (Signed)
Subjective:    Patient ID: Cassandra Gallagher, female    DOB: 08-14-1960, 56 y.o.   MRN: [id number]  HPI  Here with c/o 5 days lower abd pain, mild to mod but persistent, dull achy and "deep", without radiation or n/v.,  May have felt warm but not sure about fever.  Has hx of recurrent UTI but has only mild urinary frequency, and does not really seem like prior UTI. Denies worsening reflux, abd pain, dysphagia, n/v, bowel change or blood, but brother has recent diagnosis Lung Cancer.  She is very concerned she may have a GI cancer.  Has already seen GYN with neg pelvic exam per pt.  Has already called GI Dr Tammi Klippel who has scheduled pt for colonoscopy for Friday next wk, as she has not had screening to date.  No known hx of diverticulitis or diverticulosis. Pt denies chest pain, increased sob or doe, wheezing, orthopnea, PND, increased LE swelling, palpitations, dizziness or syncope.   Pt denies polydipsia, polyuria. Past Medical History:  Diagnosis Date  . Anxiety 06/02/2010  . BACK PAIN 07/26/2007  . 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 Cervicalgia 07/26/2007  . 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 DEPRESSIVE DISORDER 07/26/2007  . Grave's disease    hx of Grave's Disease/Hyperthyroidism  . Hx of migraines   . Hyperlipidemia 12/17/2011  . Insomnia 06/02/2010  . Lumbar degenerative disc disease 01/20/2011  . Lump or mass in breast 11/14/2008  . Palpitations 09/21/2008  . UTI 05/11/2007   Past Surgical History:  Procedure Laterality Date  . BREAST BIOPSY    . CESAREAN SECTION  10/29/2008   twins  . DILITATION & CURRETTAGE/HYSTROSCOPY WITH VERSAPOINT RESECTION N/A 07/20/2012   Procedure: DILATATION & CURETTAGE/HYSTEROSCOPY WITH VERSAPOINT RESECTION;  Surgeon: Princess Bruins, MD;  Location: [facility];  Service: Gynecology;  Laterality: N/A;  . HYSTEROSCOPY W/D&C  07-20-12    reports that she quit smoking about 20 years ago. She has never used smokeless tobacco. She reports that  she does not drink alcohol or use drugs. family history includes Breast cancer in her maternal aunt and mother; Cancer in her brother and father; Hypertension in her mother. No Known Allergies Current Outpatient Prescriptions on File Prior to Visit  Medication Sig Dispense Refill  . clonazePAM (KLONOPIN) 0.5 MG tablet TAKE (1) TABLET TWICE A DAY AS NEEDED. 60 tablet 3  . diclofenac (VOLTAREN) 75 MG EC tablet Take 1 tablet (75 mg total) by mouth 2 (two) times daily. 60 tablet 3  . Diclofenac Potassium 50 MG PACK Take by mouth.    . rizatriptan (MAXALT) 10 MG tablet Take 1 tablet (10 mg total) by mouth as needed for migraine. May repeat in 2 hours if needed 10 tablet 5  . topiramate (TOPAMAX) 100 MG tablet Take 100 mg by mouth 2 (two) times daily.    . Vitamin D, Ergocalciferol, (DRISDOL) 50000 units CAPS capsule Take 1 capsule (50,000 Units total) by mouth every 7 (seven) days. 12 capsule 0  . ZOMIG 5 MG nasal solution   0   No current facility-administered medications on file prior to visit.    Review of Systems  Constitutional: Negative for other unusual diaphoresis or sweats HENT: Negative for ear discharge or swelling Eyes: Negative for other worsening visual disturbances Respiratory: Negative for stridor or other swelling  Gastrointestinal: Negative for worsening distension or other blood Genitourinary: Negative for retention or other urinary change Musculoskeletal: Negative for other MSK pain or swelling Skin: Negative for  color change or other new lesions Neurological: Negative for worsening tremors and other numbness  Psychiatric/Behavioral: Negative for worsening agitation or other fatigue All other system neg per pt    Objective:   Physical Exam BP 120/78   Pulse 83   Temp 97.9 F (36.6 C) (Oral)   Ht 5\' 6"  (1.676 m)   Wt 157 lb (71.2 kg)   SpO2 100%   BMI 25.34 kg/m  VS noted,  Constitutional: Pt appears in NAD HENT: Head: NCAT.  Right Ear: External ear normal.    Left Ear: External ear normal.  Eyes: . Pupils are equal, round, and reactive to light. Conjunctivae and EOM are normal Nose: without d/c or deformity Neck: Neck supple. Gross normal ROM Cardiovascular: Normal rate and regular rhythm.   Pulmonary/Chest: Effort normal and breath sounds without rales or wheezing.  Abd:  Soft, ND, + BS, no organomegaly, + at least mild tender low mid abd without guarding or rebound Neurological: Pt is alert. At baseline orientation, motor grossly intact Skin: Skin is warm. No rashes, other new lesions, no LE edema Psychiatric: Pt behavior is normal without agitation , 2+ nervous and shaky No other exam findings  POCT Urinalysis Dipstick  Order: [id number]  Status:  Final result Visible to patient:  No (Not Released) Dx:  Lower abdominal pain    Ref Range & Units 16:42 29mo ago 14yr ago   Color, UA  yellow  yellow     Clarity, UA  clear  clear     Glucose, UA  neg  negative     Bilirubin, UA  neg  negative     Ketones, UA  neg      Spec Grav, UA 1.010 - 1.025 1.015  1.020     Blood, UA  neg  negative     pH, UA 5.0 - 8.0 6.5  5.0     Protein, UA  neg  negative     Urobilinogen, UA 0.2 or 1.0 E.U./dL 0.2  0.2  0.2R    Nitrite, UA  neg  Negative     Leukocytes, UA Negative Large (3+)   Negative  TRACE    Ketones, POC UA               Assessment & Plan:

## 2016-12-11 LAB — URINE CULTURE
MICRO NUMBER:: 81227486
SPECIMEN QUALITY:: ADEQUATE

## 2016-12-12 NOTE — Assessment & Plan Note (Signed)
Hx c/w abd pain/pelvic pain, differential c/w UTI vs other such as diverticulitis; pt afeb and minimal urinary symptoms, UA dip with leukocytosis, pt declines CT for now, for empiric cipro/flagyl course but she will go to ED for any worsening or call in 3-5 days if not improved, for CT scan, and continue f/u with GI as planned for now

## 2016-12-12 NOTE — Assessment & Plan Note (Signed)
Reassurance,  to f/u any worsening symptoms or concerns

## 2016-12-14 ENCOUNTER — Other Ambulatory Visit (INDEPENDENT_AMBULATORY_CARE_PROVIDER_SITE_OTHER): Payer: BC Managed Care – PPO

## 2016-12-14 ENCOUNTER — Encounter: Payer: Self-pay | Admitting: Internal Medicine

## 2016-12-14 ENCOUNTER — Telehealth: Payer: Self-pay | Admitting: Internal Medicine

## 2016-12-14 DIAGNOSIS — R1030 Lower abdominal pain, unspecified: Secondary | ICD-10-CM

## 2016-12-14 LAB — CBC WITH DIFFERENTIAL/PLATELET
BASOS ABS: 0 10*3/uL (ref 0.0–0.1)
Basophils Relative: 0.7 % (ref 0.0–3.0)
EOS ABS: 0.3 10*3/uL (ref 0.0–0.7)
Eosinophils Relative: 4 % (ref 0.0–5.0)
HEMATOCRIT: 43.9 % (ref 36.0–46.0)
Hemoglobin: 14.5 g/dL (ref 12.0–15.0)
LYMPHS PCT: 22.3 % (ref 12.0–46.0)
Lymphs Abs: 1.4 10*3/uL (ref 0.7–4.0)
MCHC: 33.2 g/dL (ref 30.0–36.0)
MCV: 93.4 fl (ref 78.0–100.0)
Monocytes Absolute: 0.6 10*3/uL (ref 0.1–1.0)
Monocytes Relative: 9.2 % (ref 3.0–12.0)
NEUTROS ABS: 4.1 10*3/uL (ref 1.4–7.7)
NEUTROS PCT: 63.8 % (ref 43.0–77.0)
PLATELETS: 343 10*3/uL (ref 150.0–400.0)
RBC: 4.7 Mil/uL (ref 3.87–5.11)
RDW: 12.9 % (ref 11.5–15.5)
WBC: 6.5 10*3/uL (ref 4.0–10.5)

## 2016-12-14 LAB — BASIC METABOLIC PANEL
BUN: 15 mg/dL (ref 6–23)
CHLORIDE: 107 meq/L (ref 96–112)
CO2: 26 meq/L (ref 19–32)
Calcium: 10.2 mg/dL (ref 8.4–10.5)
Creatinine, Ser: 0.73 mg/dL (ref 0.40–1.20)
GFR: 87.1 mL/min (ref >60.00)
Glucose, Bld: 93 mg/dL (ref 70–99)
POTASSIUM: 4.2 meq/L (ref 3.5–5.1)
Sodium: 140 mEq/L (ref 135–145)

## 2016-12-14 LAB — LIPASE: LIPASE: 30 U/L (ref 11.0–59.0)

## 2016-12-14 NOTE — Telephone Encounter (Signed)
Ok for shirron to let pt know, I have ordered CT scan to make sure about he pain prior to the colonoscpy, and she should hear soon  Also please ask her to come to the LAB for blood test BMP to document kidney function prior to testing

## 2016-12-14 NOTE — Telephone Encounter (Signed)
Pt has been informed and expressed understanding. She stated that she had blood work done and she will have Dr. Richardean Sale office fax those over.

## 2016-12-15 ENCOUNTER — Ambulatory Visit
Admission: RE | Admit: 2016-12-15 | Discharge: 2016-12-15 | Disposition: A | Discharge status: Home / Self Care | Payer: BC Managed Care – PPO | Source: Ambulatory Visit | Attending: Internal Medicine | Admitting: Internal Medicine

## 2016-12-15 DIAGNOSIS — R1030 Lower abdominal pain, unspecified: Secondary | ICD-10-CM

## 2016-12-15 MED ORDER — IOPAMIDOL (ISOVUE-300) INJECTION 61%
100.0000 mL | Freq: Once | INTRAVENOUS | Status: AC | PRN
  Administered 2016-12-15: 100 mL via INTRAVENOUS

## 2016-12-16 ENCOUNTER — Telehealth: Payer: Self-pay

## 2016-12-16 NOTE — Telephone Encounter (Signed)
Pt has been informed and expressed understanding.  

## 2016-12-16 NOTE — Telephone Encounter (Signed)
-----   Message from Biagio Borg, MD sent at 12/15/2016  3:48 PM EST ----- Left message on MyChart, pt to cont same tx except  The test results show that your current treatment is OK, as the CT scan is negative  I think you can stop the antibiotic, and OK to proceed with the colonoscopy on Friday.Redmond Baseman to please inform pt, no further evaluation or tx needed

## 2016-12-21 ENCOUNTER — Encounter: Payer: Self-pay | Admitting: Internal Medicine

## 2016-12-22 ENCOUNTER — Ambulatory Visit: Payer: BC Managed Care – PPO | Admitting: Nurse Practitioner

## 2016-12-22 ENCOUNTER — Ambulatory Visit (INDEPENDENT_AMBULATORY_CARE_PROVIDER_SITE_OTHER)
Admission: RE | Admit: 2016-12-22 | Discharge: 2016-12-22 | Disposition: A | Discharge status: Home / Self Care | Payer: BC Managed Care – PPO | Source: Ambulatory Visit | Attending: Nurse Practitioner | Admitting: Nurse Practitioner

## 2016-12-22 ENCOUNTER — Encounter: Payer: Self-pay | Admitting: Nurse Practitioner

## 2016-12-22 VITALS — BP 106/60 | HR 76 | Temp 97.8°F | Ht 66.0 in | Wt 158.0 lb

## 2016-12-22 DIAGNOSIS — R6883 Chills (without fever): Secondary | ICD-10-CM

## 2016-12-22 DIAGNOSIS — J209 Acute bronchitis, unspecified: Secondary | ICD-10-CM

## 2016-12-22 DIAGNOSIS — J0140 Acute pansinusitis, unspecified: Secondary | ICD-10-CM | POA: Diagnosis not present

## 2016-12-22 MED ORDER — ALBUTEROL SULFATE HFA 108 (90 BASE) MCG/ACT IN AERS: 1.0000 | INHALATION_SPRAY | Freq: Four times a day (QID) | RESPIRATORY_TRACT | 0 refills | Status: DC | PRN

## 2016-12-22 MED ORDER — ALBUTEROL SULFATE (2.5 MG/3ML) 0.083% IN NEBU
2.5000 mg | INHALATION_SOLUTION | Freq: Once | RESPIRATORY_TRACT | Status: AC
  Administered 2016-12-22: 2.5 mg via RESPIRATORY_TRACT

## 2016-12-22 MED ORDER — IPRATROPIUM BROMIDE 0.03 % NA SOLN: 2.0000 | Freq: Two times a day (BID) | NASAL | 0 refills | Status: DC

## 2016-12-22 MED ORDER — HYDROCODONE-HOMATROPINE 5-1.5 MG/5ML PO SYRP: 5.0000 mL | ORAL_SOLUTION | Freq: Three times a day (TID) | ORAL | 0 refills | Status: DC | PRN

## 2016-12-22 MED ORDER — DM-GUAIFENESIN ER 30-600 MG PO TB12: 1.0000 | ORAL_TABLET | Freq: Two times a day (BID) | ORAL | 0 refills | Status: DC | PRN

## 2016-12-22 NOTE — Telephone Encounter (Signed)
shirron or staff to contact pt -  Cleona for OV if she likes

## 2016-12-22 NOTE — Progress Notes (Signed)
Subjective:  Patient ID: Cassandra Gallagher, female    DOB: 1960/03/06  Age: 56 y.o. MRN: [id number]  CC: Cough (coughing,headache,sore throat,congestion in chest---had abxazithromycin 250 mg 1 wk ago--)   Cough  This is a new problem. The current episode started 1 to 4 weeks ago. The problem has been unchanged. The problem occurs constantly. The cough is productive of purulent sputum. Associated symptoms include chest pain, chills, headaches, nasal congestion, postnasal drip, rhinorrhea, a sore throat, shortness of breath and wheezing. Pertinent negatives include no fever. The symptoms are aggravated by lying down and cold air. Treatments tried: oral abx. The treatment provided mild relief.    Outpatient Medications Prior to Visit  Medication Sig Dispense Refill  . azithromycin (ZITHROMAX) 250 MG tablet     . clonazePAM (KLONOPIN) 0.5 MG tablet TAKE (1) TABLET TWICE A DAY AS NEEDED. 60 tablet 3  . diclofenac (VOLTAREN) 75 MG EC tablet Take 1 tablet (75 mg total) by mouth 2 (two) times daily. 60 tablet 3  . Diclofenac Potassium 50 MG PACK Take by mouth.    . rizatriptan (MAXALT) 10 MG tablet Take 1 tablet (10 mg total) by mouth as needed for migraine. May repeat in 2 hours if needed 10 tablet 5  . topiramate (TOPAMAX) 100 MG tablet Take 100 mg by mouth 2 (two) times daily.    . Vitamin D, Ergocalciferol, (DRISDOL) 50000 units CAPS capsule Take 1 capsule (50,000 Units total) by mouth every 7 (seven) days. 12 capsule 0  . ZOMIG 5 MG nasal solution   0   No facility-administered medications prior to visit.     ROS See HPI  Objective:  BP 106/60   Pulse 76   Temp 97.8 F (36.6 C)   Ht 5\' 6"  (1.676 m)   Wt 158 lb (71.7 kg)   SpO2 96%   BMI 25.50 kg/m   BP Readings from Last 3 Encounters:  12/22/16 106/60  12/10/16 120/78  12/04/16 100/60    Wt Readings from Last 3 Encounters:  12/22/16 158 lb (71.7 kg)  12/10/16 157 lb (71.2 kg)  12/04/16 162 lb (73.5 kg)    Physical Exam    Constitutional: She is oriented to person, place, and time. No distress.  HENT:  Nose: Mucosal edema and rhinorrhea present. Right sinus exhibits maxillary sinus tenderness and frontal sinus tenderness. Left sinus exhibits maxillary sinus tenderness and frontal sinus tenderness.  Mouth/Throat: Uvula is midline. Posterior oropharyngeal erythema present. No oropharyngeal exudate.  Cardiovascular: Normal rate and regular rhythm.  Pulmonary/Chest: Effort normal. No respiratory distress. She has wheezes. She has no rales.  Neurological: She is alert and oriented to person, place, and time.  Skin: Skin is warm and dry. No rash noted. No erythema.  Vitals reviewed.   Lab Results  Component Value Date   WBC 6.5 12/14/2016   HGB 14.5 12/14/2016   HCT 43.9 12/14/2016   PLT 343.0 12/14/2016   GLUCOSE 93 12/14/2016   CHOL 221 (H) 01/31/2014   TRIG 123.0 01/31/2014   HDL 38.90 (L) 01/31/2014   LDLDIRECT 148.2 03/22/2012   LDLCALC 158 (H) 01/31/2014   ALT 20 01/31/2014   AST 23 01/31/2014   NA 140 12/14/2016   K 4.2 12/14/2016   CL 107 12/14/2016   CREATININE 0.73 12/14/2016   BUN 15 12/14/2016   CO2 26 12/14/2016   TSH 1.33 01/31/2014    Ct Abdomen Pelvis W Contrast  Result Date: 12/15/2016 CLINICAL DATA:  Lower abdominal pain for  3-4 weeks. EXAM: CT ABDOMEN AND PELVIS WITH CONTRAST TECHNIQUE: Multidetector CT imaging of the abdomen and pelvis was performed using the standard protocol following bolus administration of intravenous contrast. CONTRAST:  178mL ISOVUE-300 IOPAMIDOL (ISOVUE-300) INJECTION 61% COMPARISON:  12/07/2016 FINDINGS: Lower chest: Unremarkable Hepatobiliary: 0.8 by 0.4 by 0.3 cm hypodense lesion in the lateral segment left hepatic lobe on image 67/4, technically nonspecific due to small size, although statistically likely to be benign. Similar lesion along the gallbladder fossa measuring 0.4 by 0.5 cm on image 33/2. Mild thickening of the fundus of the gallbladder  potentially from focal adenomyosis, image 23/3. Pancreas: Unremarkable Spleen: Unremarkable Adrenals/Urinary Tract: Borderline bilateral hydroureter appears to terminate at the level of the iliac vessel crossovers. Urinary bladder unremarkable. No urinary tract calculi identified. Adrenal glands normal. Stomach/Bowel: Unremarkable.  Appendix normal. Vascular/Lymphatic: Unremarkable Reproductive: Unremarkable Other: No supplemental non-categorized findings. Musculoskeletal: Mild lower lumbar spondylosis and degenerative disc disease. IMPRESSION: 1. A specific cause for the patient's lower abdominal pain is not identified. 2. Focal thickening along the fundus of the gallbladder is probably from focal adenomyosis. This could be further characterized with ultrasound if clinically warranted. Electronically Signed   By: Van Clines M.D.   On: 12/15/2016 13:36    Assessment & Plan:   Kiyonna was seen today for cough.  Diagnoses and all orders for this visit:  Acute bronchitis, unspecified organism -     albuterol (PROVENTIL) (2.5 MG/3ML) 0.083% nebulizer solution 2.5 mg -     DG Chest 2 View; Future -     albuterol (PROVENTIL HFA;VENTOLIN HFA) 108 (90 Base) MCG/ACT inhaler; Inhale 1-2 puffs every 6 (six) hours as needed into the lungs. -     dextromethorphan-guaiFENesin (MUCINEX DM) 30-600 MG 12hr tablet; Take 1 tablet 2 (two) times daily as needed by mouth for cough. -     HYDROcodone-homatropine (HYCODAN) 5-1.5 MG/5ML syrup; Take 5 mLs every 8 (eight) hours as needed by mouth for cough.  Chills (without fever) -     albuterol (PROVENTIL) (2.5 MG/3ML) 0.083% nebulizer solution 2.5 mg -     DG Chest 2 View; Future -     albuterol (PROVENTIL HFA;VENTOLIN HFA) 108 (90 Base) MCG/ACT inhaler; Inhale 1-2 puffs every 6 (six) hours as needed into the lungs. -     dextromethorphan-guaiFENesin (MUCINEX DM) 30-600 MG 12hr tablet; Take 1 tablet 2 (two) times daily as needed by mouth for cough. -      HYDROcodone-homatropine (HYCODAN) 5-1.5 MG/5ML syrup; Take 5 mLs every 8 (eight) hours as needed by mouth for cough.   I am having Aiman Sonn. Cielo start on albuterol, dextromethorphan-guaiFENesin, and HYDROcodone-homatropine. I am also having her maintain her ZOMIG, rizatriptan, clonazePAM, Vitamin D (Ergocalciferol), topiramate, Diclofenac Potassium, diclofenac, and azithromycin. We administered albuterol.  Meds ordered this encounter  Medications  . azithromycin (ZITHROMAX) 250 MG tablet  . albuterol (PROVENTIL) (2.5 MG/3ML) 0.083% nebulizer solution 2.5 mg  . albuterol (PROVENTIL HFA;VENTOLIN HFA) 108 (90 Base) MCG/ACT inhaler    Sig: Inhale 1-2 puffs every 6 (six) hours as needed into the lungs.    Dispense:  1 Inhaler    Refill:  0    Order Specific Question:   Supervising Provider    Answer:   Cassandria Anger [[id number]]  . dextromethorphan-guaiFENesin (MUCINEX DM) 30-600 MG 12hr tablet    Sig: Take 1 tablet 2 (two) times daily as needed by mouth for cough.    Dispense:  14 tablet    Refill:  0  Order Specific Question:   Supervising Provider    Answer:   Cassandria Anger [[id number]]  . HYDROcodone-homatropine (HYCODAN) 5-1.5 MG/5ML syrup    Sig: Take 5 mLs every 8 (eight) hours as needed by mouth for cough.    Dispense:  60 mL    Refill:  0    Order Specific Question:   Supervising Provider    Answer:   Cassandria Anger [[id number]]    Follow-up: No Follow-up on file.  Wilfred Lacy, NP

## 2016-12-22 NOTE — Patient Instructions (Addendum)
You may use tylenol or ibuprofen for headache.  Encourage adequate oral hydration.  Normal CXR. Complete azithromycin as prescribed. Acute Bronchitis, Adult Acute bronchitis is when air tubes (bronchi) in the lungs suddenly get swollen. The condition can make it hard to breathe. It can also cause these symptoms:  A cough.  Coughing up clear, yellow, or green mucus.  Wheezing.  Chest congestion.  Shortness of breath.  A fever.  Body aches.  Chills.  A sore throat.  Follow these instructions at home: Medicines  Take over-the-counter and prescription medicines only as told by your doctor.  If you were prescribed an antibiotic medicine, take it as told by your doctor. Do not stop taking the antibiotic even if you start to feel better. General instructions  Rest.  Drink enough fluids to keep your pee (urine) clear or pale yellow.  Avoid smoking and secondhand smoke. If you smoke and you need help quitting, ask your doctor. Quitting will help your lungs heal faster.  Use an inhaler, cool mist vaporizer, or humidifier as told by your doctor.  Keep all follow-up visits as told by your doctor. This is important. How is this prevented? To lower your risk of getting this condition again:  Wash your hands often with soap and water. If you cannot use soap and water, use hand sanitizer.  Avoid contact with people who have cold symptoms.  Try not to touch your hands to your mouth, nose, or eyes.  Make sure to get the flu shot every year.  Contact a doctor if:  Your symptoms do not get better in 2 weeks. Get help right away if:  You cough up blood.  You have chest pain.  You have very bad shortness of breath.  You become dehydrated.  You faint (pass out) or keep feeling like you are going to pass out.  You keep throwing up (vomiting).  You have a very bad headache.  Your fever or chills gets worse. This information is not intended to replace advice given to  you by your health care provider. Make sure you discuss any questions you have with your health care provider. Document Released: 07/15/2007 Document Revised: 09/04/2015 Document Reviewed: 07/17/2015 Elsevier Interactive Patient Education  2017 [employer].

## 2016-12-29 ENCOUNTER — Other Ambulatory Visit: Payer: Self-pay | Admitting: Internal Medicine

## 2016-12-29 NOTE — Telephone Encounter (Signed)
Done erx 

## 2017-01-09 ENCOUNTER — Ambulatory Visit: Payer: BC Managed Care – PPO | Admitting: Family Medicine

## 2017-01-09 ENCOUNTER — Encounter: Payer: Self-pay | Admitting: Family Medicine

## 2017-01-09 ENCOUNTER — Other Ambulatory Visit (HOSPITAL_COMMUNITY)
Admit: 2017-01-09 | Discharge: 2017-01-09 | Disposition: A | Discharge status: Home / Self Care | Payer: BC Managed Care – PPO | Source: Other Acute Inpatient Hospital | Attending: Family Medicine | Admitting: Family Medicine

## 2017-01-09 VITALS — BP 100/72 | HR 64 | Temp 98.0°F | Ht 66.0 in | Wt 156.0 lb

## 2017-01-09 DIAGNOSIS — R8290 Unspecified abnormal findings in urine: Secondary | ICD-10-CM | POA: Insufficient documentation

## 2017-01-09 DIAGNOSIS — R300 Dysuria: Secondary | ICD-10-CM | POA: Diagnosis not present

## 2017-01-09 LAB — POCT URINALYSIS DIPSTICK
Bilirubin, UA: NEGATIVE
Glucose, UA: NEGATIVE
Ketones, UA: NEGATIVE
Leukocytes, UA: NEGATIVE
NITRITE UA: NEGATIVE
PH UA: 7 (ref 5.0–8.0)
PROTEIN UA: NEGATIVE
RBC UA: NEGATIVE
Spec Grav, UA: 1.015 (ref 1.010–1.025)
UROBILINOGEN UA: 0.2 U/dL

## 2017-01-09 NOTE — Progress Notes (Signed)
HPI:  Acute visit for Dysuria: -started 5 days ago -burning with urination, frequency, urgency -denies: fevers, malaise, flank pain, nausea, vomiting, hematuria, vaginal symptoms, recent sex or new sexual partners, concern for STI -she had an abx at home from last time she had dysuria - took 5 days of abx finishing yesterday and symptoms are much better  ROS: See pertinent positives and negatives per HPI.  Past Medical History:  Diagnosis Date  . Anxiety 06/02/2010  . BACK PAIN 07/26/2007  . 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 Cervicalgia 07/26/2007  . 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 DEPRESSIVE DISORDER 07/26/2007  . Grave's disease    hx of Grave's Disease/Hyperthyroidism  . Hx of migraines   . Hyperlipidemia 12/17/2011  . Insomnia 06/02/2010  . Lumbar degenerative disc disease 01/20/2011  . Lump or mass in breast 11/14/2008  . Palpitations 09/21/2008  . UTI 05/11/2007    Past Surgical History:  Procedure Laterality Date  . BREAST BIOPSY    . CESAREAN SECTION  10/29/2008   twins  . DILITATION & CURRETTAGE/HYSTROSCOPY WITH VERSAPOINT RESECTION N/A 07/20/2012   Procedure: DILATATION & CURETTAGE/HYSTEROSCOPY WITH VERSAPOINT RESECTION;  Surgeon: Princess Bruins, MD;  Location: [facility];  Service: Gynecology;  Laterality: N/A;  . HYSTEROSCOPY W/D&C  07-20-12    Family History  Problem Relation Age of Onset  . Hypertension Mother   . Breast cancer Mother   . Cancer Father        lung  . Cancer Brother        lung   . Breast cancer Maternal Aunt     Social History   Socioeconomic History  . Marital status: Married    Spouse name: None  . Number of children: 2  . Years of education: None  . Highest education level: None  Social Needs  . Financial resource strain: None  . Food insecurity - worry: None  . Food insecurity - inability: None  . Transportation needs - medical: None  . Transportation needs - non-medical: None  Occupational History   . Occupation: Engineer, building services: [employer]  Tobacco Use  . Smoking status: Former Smoker    Last attempt to quit: 11/24/1996    Years since quitting: 20.1  . Smokeless tobacco: Never Used  . Tobacco comment: smoked for 6 years, quit 15 years ago  Substance and Sexual Activity  . Alcohol use: No  . Drug use: No  . Sexual activity: Yes  Other Topics Concern  . None  Social History Narrative   2nd child born recently in September 2010.   Married, one child from previous marriage lives in [place]     Current Outpatient Medications:  .  albuterol (PROVENTIL HFA;VENTOLIN HFA) 108 (90 Base) MCG/ACT inhaler, Inhale 1-2 puffs every 6 (six) hours as needed into the lungs., Disp: 1 Inhaler, Rfl: 0 .  azithromycin (ZITHROMAX) 250 MG tablet, , Disp: , Rfl:  .  clonazePAM (KLONOPIN) 0.5 MG tablet, TAKE (1) TABLET TWICE A DAY AS NEEDED., Disp: 60 tablet, Rfl: 3 .  dextromethorphan-guaiFENesin (MUCINEX DM) 30-600 MG 12hr tablet, Take 1 tablet 2 (two) times daily as needed by mouth for cough., Disp: 14 tablet, Rfl: 0 .  diclofenac (VOLTAREN) 75 MG EC tablet, Take 1 tablet (75 mg total) by mouth 2 (two) times daily., Disp: 60 tablet, Rfl: 3 .  Diclofenac Potassium 50 MG PACK, Take by mouth., Disp: , Rfl:  .  HYDROcodone-homatropine (HYCODAN) 5-1.5 MG/5ML syrup, Take 5  mLs every 8 (eight) hours as needed by mouth for cough., Disp: 60 mL, Rfl: 0 .  ipratropium (ATROVENT) 0.03 % nasal spray, Place 2 sprays 2 (two) times daily into both nostrils. Do not use for more than 5days., Disp: 30 mL, Rfl: 0 .  rizatriptan (MAXALT) 10 MG tablet, Take 1 tablet (10 mg total) by mouth as needed for migraine. May repeat in 2 hours if needed, Disp: 10 tablet, Rfl: 5 .  topiramate (TOPAMAX) 100 MG tablet, Take 100 mg by mouth 2 (two) times daily., Disp: , Rfl:  .  Vitamin D, Ergocalciferol, (DRISDOL) 50000 units CAPS capsule, Take 1 capsule (50,000 Units total) by mouth every 7 (seven) days., Disp: 12 capsule,  Rfl: 0 .  ZOMIG 5 MG nasal solution, , Disp: , Rfl: 0  EXAM:  Vitals:   01/09/17 0937  BP: 100/72  Pulse: 64  Temp: 98 F (36.7 C)  SpO2: 100%    Body mass index is 25.18 kg/m.  GENERAL: vitals reviewed and listed above, alert, oriented, appears well hydrated and in no acute distress  HEENT: atraumatic, conjunttiva clear, no obvious abnormalities on inspection of external nose and ears  NECK: no obvious masses on inspection  LUNGS: clear to auscultation bilaterally, no wheezes, rales or rhonchi, good air movement  CV: HRRR, no peripheral edema  ABD: BS+, soft, NTTP, no CVA TTP  MS: moves all extremities without noticeable abnormality  PSYCH: pleasant and cooperative, no obvious depression or anxiety  ASSESSMENT AND PLAN:  Discussed the following assessment and plan:  Urinary tract infection without hematuria, site unspecified - Plan: POCT Urinalysis Dipstick  - udip (normal) and culture pending -plenty of water -send culture just to make sure as she is adamant she had/has UTI - though if she did the abx likely treated this well -tx with otc yeast treatment as this can sometimes cause these symptoms and she has had abx recently -Patient advised to return or notify a doctor immediately if symptoms worsen or persist or new concerns arise.  There are no Patient Instructions on file for this visit.  Colin Benton R., DO

## 2017-01-09 NOTE — Patient Instructions (Signed)
The urine test today looked good!  We will send out a culture to make sure no infection.  Do over the counter yeast treatment as we discussed.  Follow up with your doctor if symptoms persist.

## 2017-01-09 NOTE — Addendum Note (Signed)
Addended by: Juliet Rude on: 01/09/2017 11:34 AM   Modules accepted: Orders

## 2017-01-10 LAB — URINE CULTURE: Culture: NO GROWTH

## 2017-01-13 ENCOUNTER — Encounter: Payer: Self-pay | Admitting: Obstetrics & Gynecology

## 2017-01-14 NOTE — Progress Notes (Deleted)
[facility] [address], [place] [address] Phone: [phone] Subjective:    I'm seeing this patient by the request  of:    CC:   [id number]  Cassandra Gallagher is a 56 y.o. female coming in with complaint of ***  Onset-  Location Duration-  Character- Aggravating factors- Reliving factors-  Therapies tried-  Severity-     Past Medical History:  Diagnosis Date  . Anxiety 06/02/2010  . BACK PAIN 07/26/2007  . 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 Cervicalgia 07/26/2007  . 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 DEPRESSIVE DISORDER 07/26/2007  . Grave's disease    hx of Grave's Disease/Hyperthyroidism  . Hx of migraines   . Hyperlipidemia 12/17/2011  . Insomnia 06/02/2010  . Lumbar degenerative disc disease 01/20/2011  . Lump or mass in breast 11/14/2008  . Palpitations 09/21/2008  . UTI 05/11/2007   Past Surgical History:  Procedure Laterality Date  . BREAST BIOPSY    . CESAREAN SECTION  10/29/2008   twins  . DILITATION & CURRETTAGE/HYSTROSCOPY WITH VERSAPOINT RESECTION N/A 07/20/2012   Procedure: DILATATION & CURETTAGE/HYSTEROSCOPY WITH VERSAPOINT RESECTION;  Surgeon: Princess Bruins, MD;  Location: [facility];  Service: Gynecology;  Laterality: N/A;  . HYSTEROSCOPY W/D&C  07-20-12   Social History   Socioeconomic History  . Marital status: Married    Spouse name: Not on file  . Number of children: 2  . Years of education: Not on file  . Highest education level: Not on file  Social Needs  . Financial resource strain: Not on file  . Food insecurity - worry: Not on file  . Food insecurity - inability: Not on file  . Transportation needs - medical: Not on file  . Transportation needs - non-medical: Not on file  Occupational History  . Occupation: Engineer, building services: [employer]  Tobacco Use  . Smoking status: Former Smoker    Last attempt to quit: 11/24/1996    Years since quitting: 20.1  .  Smokeless tobacco: Never Used  . Tobacco comment: smoked for 6 years, quit 15 years ago  Substance and Sexual Activity  . Alcohol use: No  . Drug use: No  . Sexual activity: Yes  Other Topics Concern  . Not on file  Social History Narrative   2nd child born recently in September 2010.   Married, one child from previous marriage lives in [place]   No Known Allergies Family History  Problem Relation Age of Onset  . Hypertension Mother   . Breast cancer Mother   . Cancer Father        lung  . Cancer Brother        lung   . Breast cancer Maternal Aunt      Past medical history, social, surgical and family history all reviewed in electronic medical record.  No pertanent information unless stated regarding to the chief complaint.   Review of Systems:Review of systems updated and as accurate as of 01/14/17  No headache, visual changes, nausea, vomiting, diarrhea, constipation, dizziness, abdominal pain, skin rash, fevers, chills, night sweats, weight loss, swollen lymph nodes, body aches, joint swelling, muscle aches, chest pain, shortness of breath, mood changes.   Objective  There were no vitals taken for this visit. Systems examined below as of 01/14/17   General: No apparent distress alert and oriented x3 mood and affect normal, dressed appropriately.  HEENT: Pupils equal, extraocular movements intact  Respiratory: Patient's  speak in full sentences and does not appear short of breath  Cardiovascular: No lower extremity edema, non tender, no erythema  Skin: Warm dry intact with no signs of infection or rash on extremities or on axial skeleton.  Abdomen: Soft nontender  Neuro: Cranial nerves II through XII are intact, neurovascularly intact in all extremities with 2+ DTRs and 2+ pulses.  Lymph: No lymphadenopathy of posterior or anterior cervical chain or axillae bilaterally.  Gait normal with good balance and coordination.  MSK:  Non tender with full range of motion and good  stability and symmetric strength and tone of shoulders, elbows, wrist, hip, knee and ankles bilaterally.     Impression and Recommendations:     This case required medical decision making of moderate complexity.      Note: This dictation was prepared with Dragon dictation along with smaller phrase technology. Any transcriptional errors that result from this process are unintentional.

## 2017-01-15 ENCOUNTER — Encounter: Payer: Self-pay | Admitting: Family Medicine

## 2017-01-15 ENCOUNTER — Ambulatory Visit: Payer: BC Managed Care – PPO | Admitting: Family Medicine

## 2017-02-22 ENCOUNTER — Ambulatory Visit (INDEPENDENT_AMBULATORY_CARE_PROVIDER_SITE_OTHER): Payer: BC Managed Care – PPO | Admitting: Internal Medicine

## 2017-02-22 ENCOUNTER — Encounter: Payer: Self-pay | Admitting: Internal Medicine

## 2017-02-22 ENCOUNTER — Ambulatory Visit (INDEPENDENT_AMBULATORY_CARE_PROVIDER_SITE_OTHER)
Admission: RE | Admit: 2017-02-22 | Discharge: 2017-02-22 | Disposition: A | Discharge status: Home / Self Care | Payer: BC Managed Care – PPO | Source: Ambulatory Visit | Attending: Internal Medicine | Admitting: Internal Medicine

## 2017-02-22 VITALS — BP 112/78 | HR 81 | Temp 97.6°F | Ht 66.0 in | Wt 157.0 lb

## 2017-02-22 DIAGNOSIS — G43709 Chronic migraine without aura, not intractable, without status migrainosus: Secondary | ICD-10-CM | POA: Diagnosis not present

## 2017-02-22 DIAGNOSIS — M542 Cervicalgia: Secondary | ICD-10-CM | POA: Diagnosis not present

## 2017-02-22 DIAGNOSIS — R05 Cough: Secondary | ICD-10-CM | POA: Diagnosis not present

## 2017-02-22 DIAGNOSIS — R059 Cough, unspecified: Secondary | ICD-10-CM

## 2017-02-22 DIAGNOSIS — IMO0002 Reserved for concepts with insufficient information to code with codable children: Secondary | ICD-10-CM

## 2017-02-22 MED ORDER — CYCLOBENZAPRINE HCL 5 MG PO TABS: 5.0000 mg | ORAL_TABLET | Freq: Three times a day (TID) | ORAL | 1 refills | Status: DC | PRN

## 2017-02-22 MED ORDER — AZITHROMYCIN 250 MG PO TABS: ORAL_TABLET | ORAL | 1 refills | Status: DC

## 2017-02-22 NOTE — Progress Notes (Signed)
Subjective:    Patient ID: Cassandra Gallagher, female    DOB: [birth year], 57 y.o.   MRN: [id number]  HPI  Here with acute onset mild to mod 2-3 days ST, HA, general weakness and malaise, with prod cough greenish sputum, but Pt denies chest pain, increased sob or doe, wheezing, orthopnea, PND, increased LE swelling, palpitations, dizziness or syncope.  Also c/o post left > right and midline neck pain mild to mod constant dull and sharp sometimes with some tingling occas to the left upper back.  Has long hx of neck pain but this is acutely worse after hit her head on getting in a truck.  Her current neck pain is different from her recurrent HA and neck pain now recently improved with aimovig per neurology.  No other new complaints or interval hx Past Medical History:  Diagnosis Date  . Anxiety 06/02/2010  . BACK PAIN 07/26/2007  . 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 Cervicalgia 07/26/2007  . 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 DEPRESSIVE DISORDER 07/26/2007  . Grave's disease    hx of Grave's Disease/Hyperthyroidism  . Hx of migraines   . Hyperlipidemia 12/17/2011  . Insomnia 06/02/2010  . Lumbar degenerative disc disease 01/20/2011  . Lump or mass in breast 11/14/2008  . Palpitations 09/21/2008  . UTI 05/11/2007   Past Surgical History:  Procedure Laterality Date  . BREAST BIOPSY    . CESAREAN SECTION  10/29/2008   twins  . DILITATION & CURRETTAGE/HYSTROSCOPY WITH VERSAPOINT RESECTION N/A 07/20/2012   Procedure: DILATATION & CURETTAGE/HYSTEROSCOPY WITH VERSAPOINT RESECTION;  Surgeon: Princess Bruins, MD;  Location: [facility];  Service: Gynecology;  Laterality: N/A;  . HYSTEROSCOPY W/D&C  07-20-12    reports that she quit smoking about 20 years ago. she has never used smokeless tobacco. She reports that she does not drink alcohol or use drugs. family history includes Breast cancer in her maternal aunt and mother; Cancer in her brother and father; Hypertension in her  mother. No Known Allergies Current Outpatient Medications on File Prior to Visit  Medication Sig Dispense Refill  . albuterol (PROVENTIL HFA;VENTOLIN HFA) 108 (90 Base) MCG/ACT inhaler Inhale 1-2 puffs every 6 (six) hours as needed into the lungs. 1 Inhaler 0  . clonazePAM (KLONOPIN) 0.5 MG tablet TAKE (1) TABLET TWICE A DAY AS NEEDED. 60 tablet 3  . diclofenac (VOLTAREN) 75 MG EC tablet Take 1 tablet (75 mg total) by mouth 2 (two) times daily. 60 tablet 3  . Diclofenac Potassium 50 MG PACK Take by mouth.    Marland Kitchen HYDROcodone-homatropine (HYCODAN) 5-1.5 MG/5ML syrup Take 5 mLs every 8 (eight) hours as needed by mouth for cough. 60 mL 0  . ipratropium (ATROVENT) 0.03 % nasal spray Place 2 sprays 2 (two) times daily into both nostrils. Do not use for more than 5days. 30 mL 0  . rizatriptan (MAXALT) 10 MG tablet Take 1 tablet (10 mg total) by mouth as needed for migraine. May repeat in 2 hours if needed 10 tablet 5  . topiramate (TOPAMAX) 100 MG tablet Take 100 mg by mouth 2 (two) times daily.    . Vitamin D, Ergocalciferol, (DRISDOL) 50000 units CAPS capsule Take 1 capsule (50,000 Units total) by mouth every 7 (seven) days. 12 capsule 0  . ZOMIG 5 MG nasal solution   0   No current facility-administered medications on file prior to visit.    Review of Systems  Constitutional: Negative for other unusual diaphoresis or sweats HENT: Negative for  ear discharge or swelling Eyes: Negative for other worsening visual disturbances Respiratory: Negative for stridor or other swelling  Gastrointestinal: Negative for worsening distension or other blood Genitourinary: Negative for retention or other urinary change Musculoskeletal: Negative for other MSK pain or swelling Skin: Negative for color change or other new lesions Neurological: Negative for worsening tremors and other numbness  Psychiatric/Behavioral: Negative for worsening agitation or other fatigue All other system neg per pt    Objective:    Physical Exam BP 112/78   Pulse 81   Temp 97.6 F (36.4 C) (Oral)   Ht 5\' 6"  (1.676 m)   Wt 157 lb (71.2 kg)   SpO2 98%   BMI 25.34 kg/m  \VS noted, mild ill Constitutional: Pt appears in NAD HENT: Head: NCAT.  Right Ear: External ear normal.  Left Ear: External ear normal.  Eyes: . Pupils are equal, round, and reactive to light. Conjunctivae and EOM are normal Bilat tm's with mild erythema.  Max sinus areas non tender.  Pharynx with mild erythema, no exudate Nose: without d/c or deformity Neck: Neck supple. Gross normal ROM Cardiovascular: Normal rate and regular rhythm.   Pulmonary/Chest: Effort normal and breath sounds decreasd without rales or wheezing.  Neurological: Pt is alert. At baseline orientation, motor grossly intact Skin: Skin is warm. No rashes, other new lesions, no LE edema Psychiatric: Pt behavior is normal without agitation  No other exam findings    Assessment & Plan:

## 2017-02-22 NOTE — Assessment & Plan Note (Signed)
Improved with aimovig,  to f/u any worsening symptoms or concerns

## 2017-02-22 NOTE — Patient Instructions (Addendum)
Please take all new medication as prescribed - the antibiotic, and muscle relaxer as needed  Please continue all other medications as before, and refills have been done if requested.  Please have the pharmacy call with any other refills you may need.  Please keep your appointments with your specialists as you may have planned  Please go to the XRAY Department in the Basement (go straight as you get off the elevator) for the x-ray testing  You will be contacted by phone if any changes need to be made immediately.  Otherwise, you will receive a letter about your results with an explanation, but please check with MyChart first.  Please remember to sign up for MyChart if you have not done so, as this will be important to you in the future with finding out test results, communicating by private email, and scheduling acute appointments online when needed.

## 2017-02-22 NOTE — Assessment & Plan Note (Signed)
Acute on chronic with recent closed head trauma; for c spine films, ok to cont the voltaren 75 bid prn, and flexeril 5 tid prn,  to f/u any worsening symptoms or concerns

## 2017-02-22 NOTE — Assessment & Plan Note (Signed)
.  Mild to mod, c/w bronchitis vs pna, declines cxr, for antibx course, to f/u any worsening symptoms or concerns  

## 2017-02-25 ENCOUNTER — Encounter: Payer: Self-pay | Admitting: Internal Medicine

## 2017-02-26 MED ORDER — LEVOFLOXACIN 500 MG PO TABS: 500.0000 mg | ORAL_TABLET | Freq: Every day | ORAL | 0 refills | Status: AC

## 2017-02-26 NOTE — Telephone Encounter (Signed)
[place] for change to levaquin - ok to let pt know

## 2017-03-06 ENCOUNTER — Encounter: Payer: Self-pay | Admitting: Family Medicine

## 2017-03-06 ENCOUNTER — Ambulatory Visit: Payer: BC Managed Care – PPO | Admitting: Family Medicine

## 2017-03-06 DIAGNOSIS — R109 Unspecified abdominal pain: Secondary | ICD-10-CM | POA: Diagnosis not present

## 2017-03-06 NOTE — Progress Notes (Signed)
Recently seen re: neck pain and started on levaquin re: cough.  Hasn't had her dose of levaquin yet today.  Her cough is getting better. No fevers known.    Seen today re: abd pain.  Was at work on Tuesday and her abdomen started to hurt.  Felt sweaty and nauseated.  She was out of work the next two days.  The pain got some better but she had dec in appetite. She felt bloated after eating in the meantime.    She has sig stressors, with her brother being ill with cancer.   She feels some better in the meantime,today is better. She ate toast for breakfast and tolerated that.  She is on hydrocodone for her neck, usually on high fiber diet. Last BM was Tuesday.    She did some some diarrhea prior to the pain started, not in the meantime. No vomiting in the meantime.   No known fevers.    Meds, vitals, and allergies reviewed.   ROS: Per HPI unless specifically indicated in ROS section   GEN: nad, alert and oriented HEENT: mucous membranes moist NECK: supple w/o LA CV: rrr PULM: ctab, no inc wob ABD: soft, +bs, not ttp at all EXT: no edema

## 2017-03-06 NOTE — Patient Instructions (Signed)
Hold the levaquin for now, update your regular doc on Monday, update me as needed in the meantime.  Gradually advance your diet.  Take care.  Glad to see you.

## 2017-03-06 NOTE — Assessment & Plan Note (Signed)
Clearly improved in the meantime. She was concerned about bowel obstruction but this doesn't seem to be possible at this point with resolution of abd pain. No sign of ominous dx. D/w pt.  Not ttp, normal BS.  She could have constipation related to hydrocodone.   She treats with high fiber diet. Continue to ADAT and update me as needed over the weekend.  Unclear if abd pain related to abx use.  Ctab, okay to hold abx today and tomorrow and update PCP on Monday.  No indication for labs at this point. Okay for outpatient f/u.  She agrees.

## 2017-03-23 ENCOUNTER — Encounter: Payer: Self-pay | Admitting: Internal Medicine

## 2017-03-23 MED ORDER — ALBUTEROL SULFATE HFA 108 (90 BASE) MCG/ACT IN AERS: 2.0000 | INHALATION_SPRAY | Freq: Four times a day (QID) | RESPIRATORY_TRACT | 5 refills | Status: DC | PRN

## 2017-03-23 MED ORDER — CYCLOBENZAPRINE HCL 5 MG PO TABS: 5.0000 mg | ORAL_TABLET | Freq: Three times a day (TID) | ORAL | 2 refills | Status: DC | PRN

## 2017-04-07 ENCOUNTER — Other Ambulatory Visit: Payer: Self-pay | Admitting: Internal Medicine

## 2017-04-07 DIAGNOSIS — N6489 Other specified disorders of breast: Secondary | ICD-10-CM

## 2017-05-14 ENCOUNTER — Ambulatory Visit
Admission: RE | Admit: 2017-05-14 | Discharge: 2017-05-14 | Disposition: A | Discharge status: Home / Self Care | Payer: BC Managed Care – PPO | Source: Ambulatory Visit | Attending: Internal Medicine | Admitting: Internal Medicine

## 2017-05-14 ENCOUNTER — Ambulatory Visit: Payer: BC Managed Care – PPO

## 2017-05-14 DIAGNOSIS — N6489 Other specified disorders of breast: Secondary | ICD-10-CM

## 2017-05-17 ENCOUNTER — Other Ambulatory Visit: Payer: Self-pay | Admitting: Internal Medicine

## 2017-05-18 ENCOUNTER — Encounter: Payer: Self-pay | Admitting: Internal Medicine

## 2017-05-29 ENCOUNTER — Other Ambulatory Visit: Payer: Self-pay | Admitting: Internal Medicine

## 2017-05-31 NOTE — Telephone Encounter (Signed)
04/26/2017 60# 

## 2017-05-31 NOTE — Telephone Encounter (Signed)
Done erx 

## 2017-06-15 ENCOUNTER — Encounter: Payer: Self-pay | Admitting: Internal Medicine

## 2017-06-15 DIAGNOSIS — M549 Dorsalgia, unspecified: Secondary | ICD-10-CM

## 2017-06-16 NOTE — Telephone Encounter (Signed)
PCCs to see pt concern 

## 2017-07-01 ENCOUNTER — Other Ambulatory Visit: Payer: Self-pay | Admitting: Internal Medicine

## 2017-07-01 NOTE — Telephone Encounter (Signed)
05/31/2017 60# 

## 2017-07-01 NOTE — Telephone Encounter (Signed)
Done erx 

## 2017-07-13 ENCOUNTER — Encounter: Payer: Self-pay | Admitting: Internal Medicine

## 2017-07-14 ENCOUNTER — Other Ambulatory Visit: Payer: BC Managed Care – PPO

## 2017-07-14 ENCOUNTER — Ambulatory Visit: Payer: BC Managed Care – PPO | Admitting: Internal Medicine

## 2017-07-14 ENCOUNTER — Encounter: Payer: Self-pay | Admitting: Internal Medicine

## 2017-07-14 VITALS — BP 112/78 | HR 80 | Temp 97.8°F | Ht 66.0 in | Wt 162.0 lb

## 2017-07-14 DIAGNOSIS — W57XXXA Bitten or stung by nonvenomous insect and other nonvenomous arthropods, initial encounter: Secondary | ICD-10-CM

## 2017-07-14 DIAGNOSIS — S30861A Insect bite (nonvenomous) of abdominal wall, initial encounter: Secondary | ICD-10-CM | POA: Diagnosis not present

## 2017-07-14 NOTE — Assessment & Plan Note (Signed)
Does not appear infected, ok for tick borne dz lab today, and tx if abnormal,  to f/u any worsening symptoms or concerns

## 2017-07-14 NOTE — Progress Notes (Signed)
Subjective:    Patient ID: Cassandra Gallagher, female    DOB: 22-Jan-1961, 57 y.o.   MRN: [id number]  HPI  Here to f/u with c/o tick bite to RLQ abdomen x 1 wk, removed successfully but still with an area of nontender erythema and slight swelling about the bite site.  No fever, rash or drainage  No other tick bites  Pt denies chest pain, increased sob or doe, wheezing, orthopnea, PND, increased LE swelling, palpitations, dizziness or syncope.   No joint pain Past Medical History:  Diagnosis Date  . Anxiety 06/02/2010  . BACK PAIN 07/26/2007  . 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 Cervicalgia 07/26/2007  . 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 DEPRESSIVE DISORDER 07/26/2007  . Grave's disease    hx of Grave's Disease/Hyperthyroidism  . Hx of migraines   . Hyperlipidemia 12/17/2011  . Insomnia 06/02/2010  . Lumbar degenerative disc disease 01/20/2011  . Lump or mass in breast 11/14/2008  . Palpitations 09/21/2008  . UTI 05/11/2007   Past Surgical History:  Procedure Laterality Date  . BREAST BIOPSY Right   . CESAREAN SECTION  10/29/2008   twins  . DILITATION & CURRETTAGE/HYSTROSCOPY WITH VERSAPOINT RESECTION N/A 07/20/2012   Procedure: DILATATION & CURETTAGE/HYSTEROSCOPY WITH VERSAPOINT RESECTION;  Surgeon: Princess Bruins, MD;  Location: [facility];  Service: Gynecology;  Laterality: N/A;  . HYSTEROSCOPY W/D&C  07-20-12    reports that she quit smoking about 20 years ago. She has never used smokeless tobacco. She reports that she does not drink alcohol or use drugs. family history includes Breast cancer (age of onset: 31) in her maternal aunt; Breast cancer (age of onset: 76) in her mother; Cancer in her brother and father; Hypertension in her mother. No Known Allergies Current Outpatient Medications on File Prior to Visit  Medication Sig Dispense Refill  . albuterol (PROVENTIL HFA;VENTOLIN HFA) 108 (90 Base) MCG/ACT inhaler Inhale 2 puffs into the lungs every 6 (six) hours  as needed for wheezing or shortness of breath. 1 Inhaler 5  . clonazePAM (KLONOPIN) 0.5 MG tablet TAKE (1) TABLET TWICE A DAY AS NEEDED. 60 tablet 2  . cyclobenzaprine (FLEXERIL) 5 MG tablet TAKE 1 TABLET THREE TIMES DAILY AS NEEDED FOR MUSCLE SPASMS. 30 tablet 0  . Erenumab-aooe (AIMOVIG [place]) Inject into the skin.    Marland Kitchen HYDROcodone-acetaminophen (NORCO) 7.5-325 MG tablet take 1 tablet by mouth every 8 hours if needed  0  . Vitamin D, Ergocalciferol, (DRISDOL) 50000 units CAPS capsule Take 1 capsule (50,000 Units total) by mouth every 7 (seven) days. 12 capsule 0  . ZOMIG 5 MG nasal solution   0   No current facility-administered medications on file prior to visit.    Review of Systems   All other system neg per pt    Objective:   Physical Exam BP 112/78   Pulse 80   Temp 97.8 F (36.6 C) (Oral)   Ht 5\' 6"  (1.676 m)   Wt 162 lb (73.5 kg)   SpO2 98%   BMI 26.15 kg/m  VS noted,  Constitutional: Pt appears in NAD HENT: Head: NCAT.  Right Ear: External ear normal.  Left Ear: External ear normal.  Eyes: . Pupils are equal, round, and reactive to light. Conjunctivae and EOM are normal Nose: without d/c or deformity Neck: Neck supple. Gross normal ROM Cardiovascular: Normal rate and regular rhythm.   Pulmonary/Chest: Effort normal and breath sounds without rales or wheezing.  Abd:  Soft, NT, ND, +  BS, no organomegaly but has RLQ bite site with 1/2 cm surrounding erythema and slightly raised swelling, no drainage Neurological: Pt is alert. At baseline orientation, motor grossly intact Skin: Skin is warm. No rashes, other new lesions, no LE edema Psychiatric: Pt behavior is normal without agitation  No other exam findings    Assessment & Plan:

## 2017-07-14 NOTE — Patient Instructions (Signed)

## 2017-07-16 LAB — HGE(IGG/M)+LYMEAB(IGM)+RKYIGM
HGE IGG TITER: NEGATIVE
HGE IGM TITER: NEGATIVE
LYME DISEASE AB, QUANT, IGM: <0.8 index (ref 0.00–0.79)
RMSF IgM: 0.42 index (ref 0.00–0.89)

## 2017-09-01 ENCOUNTER — Ambulatory Visit: Payer: BC Managed Care – PPO | Admitting: Internal Medicine

## 2017-09-01 ENCOUNTER — Encounter: Payer: Self-pay | Admitting: Internal Medicine

## 2017-09-13 ENCOUNTER — Encounter: Payer: Self-pay | Admitting: Internal Medicine

## 2017-10-12 ENCOUNTER — Other Ambulatory Visit: Payer: Self-pay | Admitting: Specialist

## 2017-10-12 DIAGNOSIS — M545 Low back pain: Secondary | ICD-10-CM

## 2017-10-12 DIAGNOSIS — G8929 Other chronic pain: Secondary | ICD-10-CM

## 2017-10-12 DIAGNOSIS — M5417 Radiculopathy, lumbosacral region: Secondary | ICD-10-CM

## 2017-11-25 ENCOUNTER — Encounter: Payer: BC Managed Care – PPO | Admitting: Obstetrics & Gynecology

## 2017-11-25 DIAGNOSIS — Z0289 Encounter for other administrative examinations: Secondary | ICD-10-CM

## 2017-11-30 ENCOUNTER — Other Ambulatory Visit: Payer: Self-pay | Admitting: Internal Medicine

## 2017-12-01 NOTE — Telephone Encounter (Signed)
   LOV:07/14/17 NextOV:not scheduled Last Filled/Quantity:10/21/17 60#

## 2017-12-08 ENCOUNTER — Ambulatory Visit: Payer: BC Managed Care – PPO

## 2017-12-14 ENCOUNTER — Encounter: Payer: Self-pay | Admitting: Internal Medicine

## 2017-12-21 ENCOUNTER — Encounter: Payer: Self-pay | Admitting: *Deleted

## 2017-12-21 ENCOUNTER — Ambulatory Visit (INDEPENDENT_AMBULATORY_CARE_PROVIDER_SITE_OTHER): Payer: BC Managed Care – PPO | Admitting: *Deleted

## 2017-12-21 DIAGNOSIS — Z23 Encounter for immunization: Secondary | ICD-10-CM

## 2018-01-03 ENCOUNTER — Other Ambulatory Visit: Payer: Self-pay | Admitting: Internal Medicine

## 2018-01-03 NOTE — Telephone Encounter (Signed)
MD approved and sent electronically to pof../lmb  

## 2018-01-03 NOTE — Telephone Encounter (Signed)
Done erx 

## 2018-01-03 NOTE — Telephone Encounter (Signed)
Check [place] registry last filled 12/01/2017.Marland KitchenJohny Chess

## 2018-01-11 ENCOUNTER — Other Ambulatory Visit: Payer: Self-pay | Admitting: Specialist

## 2018-01-11 DIAGNOSIS — M5417 Radiculopathy, lumbosacral region: Secondary | ICD-10-CM

## 2018-01-22 ENCOUNTER — Ambulatory Visit
Admission: RE | Admit: 2018-01-22 | Discharge: 2018-01-22 | Disposition: A | Discharge status: Home / Self Care | Payer: BC Managed Care – PPO | Source: Ambulatory Visit | Attending: Specialist | Admitting: Specialist

## 2018-01-22 DIAGNOSIS — M5417 Radiculopathy, lumbosacral region: Secondary | ICD-10-CM

## 2018-01-28 ENCOUNTER — Encounter: Payer: Self-pay | Admitting: Nurse Practitioner

## 2018-01-28 ENCOUNTER — Other Ambulatory Visit: Payer: Self-pay | Admitting: *Deleted

## 2018-01-28 ENCOUNTER — Ambulatory Visit: Payer: BC Managed Care – PPO | Admitting: Nurse Practitioner

## 2018-01-28 VITALS — BP 130/80 | HR 89 | Temp 97.5°F | Ht 66.0 in | Wt 163.0 lb

## 2018-01-28 DIAGNOSIS — G43709 Chronic migraine without aura, not intractable, without status migrainosus: Secondary | ICD-10-CM | POA: Diagnosis not present

## 2018-01-28 DIAGNOSIS — J209 Acute bronchitis, unspecified: Secondary | ICD-10-CM

## 2018-01-28 DIAGNOSIS — IMO0002 Reserved for concepts with insufficient information to code with codable children: Secondary | ICD-10-CM

## 2018-01-28 MED ORDER — ZOMIG 2.5 MG NA SOLN: 1.0000 | Freq: Once | NASAL | 0 refills | Status: DC

## 2018-01-28 MED ORDER — AMOXICILLIN-POT CLAVULANATE 875-125 MG PO TABS: 1.0000 | ORAL_TABLET | Freq: Two times a day (BID) | ORAL | 0 refills | Status: DC

## 2018-01-28 MED ORDER — RIZATRIPTAN BENZOATE 5 MG PO TABS: 5.0000 mg | ORAL_TABLET | ORAL | 0 refills | Status: DC | PRN

## 2018-01-28 NOTE — Patient Instructions (Signed)
Take augmentin twice daily for 7 days Please follow up for fevers over 101, if your symptoms get worse, or if your symptoms dont get better with the antibiotic.   Acute Bronchitis, Adult Acute bronchitis is when air tubes (bronchi) in the lungs suddenly get swollen. The condition can make it hard to breathe. It can also cause these symptoms:  A cough.  Coughing up clear, yellow, or green mucus.  Wheezing.  Chest congestion.  Shortness of breath.  A fever.  Body aches.  Chills.  A sore throat. Follow these instructions at home:  Medicines  Take over-the-counter and prescription medicines only as told by your doctor.  If you were prescribed an antibiotic medicine, take it as told by your doctor. Do not stop taking the antibiotic even if you start to feel better. General instructions  Rest.  Drink enough fluids to keep your pee (urine) pale yellow.  Avoid smoking and secondhand smoke. If you smoke and you need help quitting, ask your doctor. Quitting will help your lungs heal faster.  Use an inhaler, cool mist vaporizer, or humidifier as told by your doctor.  Keep all follow-up visits as told by your doctor. This is important. How is this prevented? To lower your risk of getting this condition again:  Wash your hands often with soap and water. If you cannot use soap and water, use hand sanitizer.  Avoid contact with people who have cold symptoms.  Try not to touch your hands to your mouth, nose, or eyes.  Make sure to get the flu shot every year. Contact a doctor if:  Your symptoms do not get better in 2 weeks. Get help right away if:  You cough up blood.  You have chest pain.  You have very bad shortness of breath.  You become dehydrated.  You faint (pass out) or keep feeling like you are going to pass out.  You keep throwing up (vomiting).  You have a very bad headache.  Your fever or chills gets worse. This information is not intended to replace  advice given to you by your health care provider. Make sure you discuss any questions you have with your health care provider. Document Released: 07/15/2007 Document Revised: 09/09/2016 Document Reviewed: 07/17/2015 Elsevier Interactive Patient Education  2019 [employer].

## 2018-01-28 NOTE — Progress Notes (Signed)
Cassandra Gallagher is a 57 y.o. female with the following history as recorded in EpicCare:  Patient Active Problem List   Diagnosis Date Noted  . Tick bite 07/14/2017  . Abdominal pain 12/10/2016  . Nonallopathic lesion of thoracic region 09/30/2016  . Nonallopathic lesion of cervical region 09/30/2016  . Nonallopathic lesion of lumbosacral region 09/30/2016  . Hair loss 05/06/2015  . Cough 02/23/2015  . Lumbosacral spondylosis without myelopathy 06/30/2013  . Chronic headache disorder 05/30/2013  . Cervico-occipital neuralgia 05/04/2013  . Cephalalgia 05/04/2013  . Cervical pain 05/04/2013  . Chronic migraine 02/23/2012  . Hyperlipidemia 12/17/2011  . Cervical spondylosis without myelopathy 10/30/2011  . 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 Lumbar degenerative disc disease 01/20/2011  . Chronic pain 09/12/2010  . Anxiety 06/02/2010  . Insomnia 06/02/2010  . Preventative health care 06/01/2010  . Lump or mass in breast 11/14/2008  . Depression 07/26/2007    Current Outpatient Medications  Medication Sig Dispense Refill  . albuterol (PROVENTIL HFA;VENTOLIN HFA) 108 (90 Base) MCG/ACT inhaler Inhale 2 puffs into the lungs every 6 (six) hours as needed for wheezing or shortness of breath. 1 Inhaler 5  . clonazePAM (KLONOPIN) 0.5 MG tablet TAKE (1) TABLET TWICE A DAY AS NEEDED. 60 tablet 2  . cyclobenzaprine (FLEXERIL) 5 MG tablet TAKE 1 TABLET THREE TIMES DAILY AS NEEDED FOR MUSCLE SPASMS. 30 tablet 0  . Erenumab-aooe (AIMOVIG [place]) Inject into the skin.    . Fremanezumab-vfrm (AJOVY) 225 MG/1.5ML SOSY INJECT 3 SYRINGES ONCE MONTHLY EVERY 3 MONTHS    . HYDROcodone-acetaminophen (NORCO) 7.5-325 MG tablet take 1 tablet by mouth every 8 hours if needed  0  . Vitamin D, Ergocalciferol, (DRISDOL) 50000 units CAPS capsule Take 1 capsule (50,000 Units total) by mouth every 7 (seven) days. 12 capsule 0  . amoxicillin-clavulanate (AUGMENTIN) 875-125 MG tablet Take 1 tablet by mouth 2 (two)  times daily. 14 tablet 0  . rizatriptan (MAXALT) 5 MG tablet Take 1 tablet (5 mg total) by mouth as needed for migraine. May repeat in 2 hours if needed 10 tablet 0  . ZOMIG 5 MG nasal solution   0   No current facility-administered medications for this visit.     Allergies: Patient has no known allergies.  Past Medical History:  Diagnosis Date  . Anxiety 06/02/2010  . BACK PAIN 07/26/2007  . 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 Cervicalgia 07/26/2007  . 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 DEPRESSIVE DISORDER 07/26/2007  . Grave's disease    hx of Grave's Disease/Hyperthyroidism  . Hx of migraines   . Hyperlipidemia 12/17/2011  . Insomnia 06/02/2010  . Lumbar degenerative disc disease 01/20/2011  . Lump or mass in breast 11/14/2008  . Palpitations 09/21/2008  . UTI 05/11/2007    Past Surgical History:  Procedure Laterality Date  . BREAST BIOPSY Right   . CESAREAN SECTION  10/29/2008   twins  . DILITATION & CURRETTAGE/HYSTROSCOPY WITH VERSAPOINT RESECTION N/A 07/20/2012   Procedure: DILATATION & CURETTAGE/HYSTEROSCOPY WITH VERSAPOINT RESECTION;  Surgeon: Princess Bruins, MD;  Location: [facility];  Service: Gynecology;  Laterality: N/A;  . HYSTEROSCOPY W/D&C  07-20-12    Family History  Problem Relation Age of Onset  . Hypertension Mother   . Breast cancer Mother 32  . Cancer Father        lung  . Cancer Brother        lung   . Breast cancer Maternal Aunt 59    Social History  Tobacco Use  . Smoking status: Former Smoker    Last attempt to quit: 11/24/1996    Years since quitting: 21.1  . Smokeless tobacco: Never Used  . Tobacco comment: smoked for 6 years, quit 15 years ago  Substance Use Topics  . Alcohol use: No     Subjective:  Cassandra Gallagher is here today requesting evaluation of an acute complaint of cough/cold symptoms. She is also requesting a refill of maxalt, states she has actually been on zomig for some time but is normally provided with 2-6  packs of zomig at her neurology visits, realized after last visit with neurology they only sent her 1- 6 pack refill and actually would prefer to have maxalt again which she was on prior to zomig,, is going back to see neurology in January for regular follow up but having hard time reaching neurology office on phone to request medication refill. She tells me that shes had cough/cold symptoms for about 3 weeks now, first noticed sore throat around thanksgiving, then began to have malaise, cough, now with productive cough for past week or so and feels like when she had bronchitis. She reports chills, malaise, headaches, sinus pressure, nasal congestion, drainage No syncope, confusion, cp, sob, abdominal pain Taking aspirin at home with minimal relief of symptoms Former smoker, quit many years ago   ROS- See HPI  Objective:  Vitals:   01/28/18 0936  BP: 130/80  Pulse: 89  Temp: (!) 97.5 F (36.4 C)  TempSrc: Oral  SpO2: 98%  Weight: 163 lb (73.9 kg)  Height: 5\' 6"  (1.676 m)    General: Well developed, well nourished, in no acute distress  Skin : Warm and dry.  Head: Normocephalic and atraumatic  Eyes: Sclera and conjunctiva clear; pupils round and reactive to light; extraocular movements intact  Ears: External normal; canals clear; tympanic membranes normal  Oropharynx: Posterior oropharyngeal erythema without edema or exudate. No suspicious lesions  Neck: Supple without thyromegaly, adenopathy  Lungs: Respirations unlabored; clear to auscultation bilaterally without wheeze, rales, rhonchi  CVS exam: normal rate and regular rhythm, S1 and S2 normal.  Extremities: No edema, cyanosis Vessels: Symmetric bilaterally  Neurologic: Alert and oriented; speech intact; face symmetrical; moves all extremities well; CNII-XII intact without focal deficit  Psychiatric: Normal mood and affect.  Assessment:  1. Acute bronchitis, unspecified organism   2. Chronic migraine     Plan:   Acute  bronchitis, unspecified organism Due to duration of symptoms, no improvement, will start abx course-medication dosing, side effects discussed Home management, red flags and return precautions including when to seek immediate care discussed and printed on AVS She was instructed to follow up for new, worsening symptoms or if no improvement with abx - amoxicillin-clavulanate (AUGMENTIN) 875-125 MG tablet; Take 1 tablet by mouth 2 (two) times daily.  Dispense: 14 tablet; Refill:    No follow-ups on file.  No orders of the defined types were placed in this encounter.   Requested Prescriptions   Signed Prescriptions Disp Refills  . amoxicillin-clavulanate (AUGMENTIN) 875-125 MG tablet 14 tablet 0    Sig: Take 1 tablet by mouth 2 (two) times daily.  . rizatriptan (MAXALT) 5 MG tablet 10 tablet 0    Sig: Take 1 tablet (5 mg total) by mouth as needed for migraine. May repeat in 2 hours if needed

## 2018-01-28 NOTE — Assessment & Plan Note (Signed)
We discussed following up with neurology for adjustment of medications/switching triptans but she persistently asks for me to provide maxalt refill for her today- 1 maxalt refill sent Keep planned follow up with neurology next month - rizatriptan (MAXALT) 5 MG tablet; Take 1 tablet (5 mg total) by mouth as needed for migraine. May repeat in 2 hours if needed  Dispense: 10 tablet; Refill: 0

## 2018-02-21 ENCOUNTER — Ambulatory Visit: Payer: Self-pay

## 2018-02-21 NOTE — Telephone Encounter (Signed)
Incoming call from Patient who complains of getting dizzy suddenly.  Patient states that she was walking and got dizzy as she was walking.   Started shaking started sweating. Patient states that this has happened before this time more extdreme.  It gets getts when sitting down. Rates it moderate and severe .  At one point wasn't sure she would get home.  It occurred last night.  Patient drank a pepsi and better.  Scheduled an appointment for 02/22/2018 with Dr.  Cathlean Cower.  Patient voiced understanding.  Reviewed care advice, Patient voiced understanding.   Reason for Disposition . [1] MODERATE dizziness (e.g., interferes with normal activities) AND [2] has been evaluated by physician for this  Answer Assessment - Initial Assessment Questions 1. DESCRIPTION: "Describe your dizziness."     Shaking  dizziness 2. LIGHTHEADED: "Do you feel lightheaded?" (e.g., somewhat faint, woozy, weak upon standing)     Shaking some what faint  3. VERTIGO: "Do you feel like either you or the room is spinning or tilting?" (i.e. vertigo)     no 4. SEVERITY: "How bad is it?"  "Do you feel like you are going to faint?" "Can you stand and walk?"   - MILD - walking normally   - MODERATE - interferes with normal activities (e.g., work, school)    - SEVERE - unable to stand, requires support to walk, feels like passing out now.      Moderate ans severe wasn't sure I could get home 5. ONSET:  "When did the dizziness begin?"     FRIDAY 6. AGGRAVATING FACTORS: "Does anything make it worse?" (e.g., standing, change in head position)     I dont know 7. HEART RATE: "Can you tell me your heart rate?" "How many beats in 15 seconds?"  (Note: not all patients can do this)       *No Answer* 8. CAUSE: "What do you think is causing the dizziness?"      Be cause I didn't eat. 9. RECURRENT SYMPTOM: "Have you had dizziness before?" If so, ask: "When was the last time?" "What happened that time?"     25  Years ago  10. OTHER  SYMPTOMS: "Do you have any other symptoms?" (e.g., fever, chest pain, vomiting, diarrhea, bleeding)      Night  For the last month , sweating  Last night , drank a pepsi drank a pepsi  Felt better 11. PREGNANCY: "Is there any chance you are pregnant?" "When was your last menstrual period?"       mennopasual  Protocols used: DIZZINESS [facility]

## 2018-02-22 ENCOUNTER — Ambulatory Visit: Payer: BC Managed Care – PPO | Admitting: Internal Medicine

## 2018-02-22 ENCOUNTER — Other Ambulatory Visit (INDEPENDENT_AMBULATORY_CARE_PROVIDER_SITE_OTHER): Payer: BC Managed Care – PPO

## 2018-02-22 ENCOUNTER — Encounter: Payer: Self-pay | Admitting: Internal Medicine

## 2018-02-22 VITALS — BP 112/68 | HR 95 | Temp 98.1°F | Ht 66.0 in | Wt 164.0 lb

## 2018-02-22 DIAGNOSIS — R739 Hyperglycemia, unspecified: Secondary | ICD-10-CM | POA: Insufficient documentation

## 2018-02-22 DIAGNOSIS — Z1159 Encounter for screening for other viral diseases: Secondary | ICD-10-CM

## 2018-02-22 DIAGNOSIS — E559 Vitamin D deficiency, unspecified: Secondary | ICD-10-CM | POA: Diagnosis not present

## 2018-02-22 DIAGNOSIS — R42 Dizziness and giddiness: Secondary | ICD-10-CM | POA: Insufficient documentation

## 2018-02-22 DIAGNOSIS — Z0001 Encounter for general adult medical examination with abnormal findings: Secondary | ICD-10-CM

## 2018-02-22 DIAGNOSIS — E785 Hyperlipidemia, unspecified: Secondary | ICD-10-CM

## 2018-02-22 LAB — BASIC METABOLIC PANEL
BUN: 13 mg/dL (ref 6–23)
CHLORIDE: 105 meq/L (ref 96–112)
CO2: 27 mEq/L (ref 19–32)
Calcium: 10.1 mg/dL (ref 8.4–10.5)
Creatinine, Ser: 0.65 mg/dL (ref 0.40–1.20)
GFR: 99.18 mL/min (ref >60.00)
Glucose, Bld: 90 mg/dL (ref 70–99)
Potassium: 4.3 mEq/L (ref 3.5–5.1)
Sodium: 141 mEq/L (ref 135–145)

## 2018-02-22 LAB — URINALYSIS, ROUTINE W REFLEX MICROSCOPIC
Bilirubin Urine: NEGATIVE
Hgb urine dipstick: NEGATIVE
Ketones, ur: NEGATIVE
Nitrite: NEGATIVE
RBC / HPF: NONE SEEN (ref 0–?)
Specific Gravity, Urine: 1.01 (ref 1.000–1.030)
Total Protein, Urine: NEGATIVE
Urine Glucose: NEGATIVE
Urobilinogen, UA: 0.2 (ref 0.0–1.0)
pH: 8 (ref 5.0–8.0)

## 2018-02-22 LAB — CBC WITH DIFFERENTIAL/PLATELET
BASOS ABS: 0 10*3/uL (ref 0.0–0.1)
Basophils Relative: 1.1 % (ref 0.0–3.0)
EOS ABS: 0.2 10*3/uL (ref 0.0–0.7)
Eosinophils Relative: 6 % — ABNORMAL HIGH (ref 0.0–5.0)
HCT: 41.7 % (ref 36.0–46.0)
Hemoglobin: 14.2 g/dL (ref 12.0–15.0)
Lymphocytes Relative: 23.3 % (ref 12.0–46.0)
Lymphs Abs: 1 10*3/uL (ref 0.7–4.0)
MCHC: 34.1 g/dL (ref 30.0–36.0)
MCV: 92.5 fl (ref 78.0–100.0)
Monocytes Absolute: 0.4 10*3/uL (ref 0.1–1.0)
Monocytes Relative: 9.1 % (ref 3.0–12.0)
Neutro Abs: 2.5 10*3/uL (ref 1.4–7.7)
Neutrophils Relative %: 60.5 % (ref 43.0–77.0)
Platelets: 280 10*3/uL (ref 150.0–400.0)
RBC: 4.51 Mil/uL (ref 3.87–5.11)
RDW: 12.5 % (ref 11.5–15.5)
WBC: 4.1 10*3/uL (ref 4.0–10.5)

## 2018-02-22 LAB — LIPID PANEL
CHOL/HDL RATIO: 7
Cholesterol: 255 mg/dL — ABNORMAL HIGH (ref 0–200)
HDL: 37.3 mg/dL — ABNORMAL LOW (ref >39.00)
NonHDL: 217.93
Triglycerides: 261 mg/dL — ABNORMAL HIGH (ref 0.0–149.0)
VLDL: 52.2 mg/dL — AB (ref 0.0–40.0)

## 2018-02-22 LAB — HEMOGLOBIN A1C: Hgb A1c MFr Bld: 5.7 % (ref 4.6–6.5)

## 2018-02-22 LAB — HEPATIC FUNCTION PANEL
ALT: 19 U/L (ref 0–35)
AST: 16 U/L (ref 0–37)
Albumin: 4.3 g/dL (ref 3.5–5.2)
Alkaline Phosphatase: 73 U/L (ref 39–117)
Bilirubin, Direct: 0 mg/dL (ref 0.0–0.3)
Total Bilirubin: 0.3 mg/dL (ref 0.2–1.2)
Total Protein: 7.5 g/dL (ref 6.0–8.3)

## 2018-02-22 LAB — CORTISOL: Cortisol, Plasma: 11.9 ug/dL

## 2018-02-22 LAB — TSH: TSH: 0.74 u[IU]/mL (ref 0.35–4.50)

## 2018-02-22 LAB — VITAMIN D 25 HYDROXY (VIT D DEFICIENCY, FRACTURES): VITD: 30.51 ng/mL (ref 30.00–100.00)

## 2018-02-22 LAB — LDL CHOLESTEROL, DIRECT: Direct LDL: 186 mg/dL

## 2018-02-22 NOTE — Assessment & Plan Note (Signed)
For a1c with labs, o/w stable

## 2018-02-22 NOTE — Patient Instructions (Addendum)
Your EKG was OK today  Please continue all other medications as before, and refills have been done if requested.  Please have the pharmacy call with any other refills you may need.  Please continue your efforts at being more active, low cholesterol diet, and weight control.  You are otherwise up to date with prevention measures today.  Please keep your appointments with your specialists as you may have planned  Please go to the LAB in the Basement (turn left off the elevator) for the tests to be done today  You will be contacted by phone if any changes need to be made immediately.  Otherwise, you will receive a letter about your results with an explanation, but please check with MyChart first.  Please remember to sign up for MyChart if you have not done so, as this will be important to you in the future with finding out test results, communicating by private email, and scheduling acute appointments online when needed.  Please return in 1 year for your yearly visit, or sooner if needed, with Lab testing done 3-5 days before;  Please also return for any worsening symptoms for further testing as we discussed, to consider Echocardiogram, carotids and MRI head

## 2018-02-22 NOTE — Assessment & Plan Note (Signed)
stable overall by history and exam, recent data reviewed with pt, and pt to continue medical treatment as before,  to f/u any worsening symptoms or concerns, goal ldl < 100

## 2018-02-22 NOTE — Progress Notes (Signed)
Subjective:    Patient ID: Cassandra Gallagher, female    DOB: 1960-06-25, 58 y.o.   MRN: [id number]  HPI  Here for wellness and f/u;  Overall doing ok;  Pt denies Chest pain, worsening SOB, DOE, wheezing, orthopnea, PND, worsening LE edema, palpitations, dizziness or syncope.  Pt denies neurological change such as new headache, facial or extremity weakness.  Pt denies polydipsia, polyuria, or low sugar symptoms. Pt states overall good compliance with treatment and medications, good tolerability, and has been trying to follow appropriate diet.  Pt denies worsening depressive symptoms, suicidal ideation or panic. No fever, night sweats, wt loss, loss of appetite, or other constitutional symptoms.  Pt states good ability with ADL's, has low fall risk, home safety reviewed and adequate, no other significant changes in hearing or vision, and only occasionally active with exercise.  Looking to change GYN, plans to have pap soon.    Also with ? problem with "low blood sugar" ?  Has been for many years, better with snacking and eating regularly, and admits late in her exam she actually bought a glucometer recently and has sugars 89-130 with the higher numbers usually after eating.. Last Friday with 20 min walk from work to home, but about 15 min into it had significant shaky, sweating , dizzy, weak and even starting walking on the grass in case she would fall;  Finally got home, had juice and some food, felt somewhat improved, but seemed to take about 4 hours to feel better.  Also for 1 -2 months with night sweats unusual for her where the clothes are soaked.  NO sense of vertigo, ST, cough, n/v, fever, CP , sob or bleeding though does have recurring headaches, not really more frequent recently, now taking Ajovi per neurology (does seem to helpand no related side effects she mentions per [employer])  Also having about 2 mo onset "diarrhea" but mostly always just loose, no overt bleeding, and funny to her as she is also  taking some narcotic and has had constipation  Next day (Saturday) went shopping about 2 hrs after eating, and started to feel bad again.  Menopausal x 8 yrs, hard to think the symtptms are from this.  LMP about 8 yrs ago.  Appetite ok, no recent wt loss.  Denies worsening reflux, abd pain, dysphagia.   Wt Readings from Last 3 Encounters:  02/22/18 164 lb (74.4 kg)  01/28/18 163 lb (73.9 kg)  07/14/17 162 lb (73.5 kg)   Past Medical History:  Diagnosis Date  . Anxiety 06/02/2010  . BACK PAIN 07/26/2007  . 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 Cervicalgia 07/26/2007  . 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 DEPRESSIVE DISORDER 07/26/2007  . Grave's disease    hx of Grave's Disease/Hyperthyroidism  . Hx of migraines   . Hyperlipidemia 12/17/2011  . Insomnia 06/02/2010  . Lumbar degenerative disc disease 01/20/2011  . Lump or mass in breast 11/14/2008  . Palpitations 09/21/2008  . UTI 05/11/2007   Past Surgical History:  Procedure Laterality Date  . BREAST BIOPSY Right   . CESAREAN SECTION  10/29/2008   twins  . DILITATION & CURRETTAGE/HYSTROSCOPY WITH VERSAPOINT RESECTION N/A 07/20/2012   Procedure: DILATATION & CURETTAGE/HYSTEROSCOPY WITH VERSAPOINT RESECTION;  Surgeon: Princess Bruins, MD;  Location: [facility];  Service: Gynecology;  Laterality: N/A;  . HYSTEROSCOPY W/D&C  07-20-12    reports that she quit smoking about 21 years ago. She has never used smokeless tobacco. She reports that she  does not drink alcohol or use drugs. family history includes Breast cancer (age of onset: 38) in her maternal aunt; Breast cancer (age of onset: 56) in her mother; Cancer in her brother and father; Hypertension in her mother. No Known Allergies Current Outpatient Medications on File Prior to Visit  Medication Sig Dispense Refill  . albuterol (PROVENTIL HFA;VENTOLIN HFA) 108 (90 Base) MCG/ACT inhaler Inhale 2 puffs into the lungs every 6 (six) hours as needed for wheezing or shortness  of breath. 1 Inhaler 5  . amoxicillin-clavulanate (AUGMENTIN) 875-125 MG tablet Take 1 tablet by mouth 2 (two) times daily. 14 tablet 0  . clonazePAM (KLONOPIN) 0.5 MG tablet TAKE (1) TABLET TWICE A DAY AS NEEDED. 60 tablet 2  . cyclobenzaprine (FLEXERIL) 5 MG tablet TAKE 1 TABLET THREE TIMES DAILY AS NEEDED FOR MUSCLE SPASMS. 30 tablet 0  . Erenumab-aooe (AIMOVIG [place]) Inject into the skin.    . Fremanezumab-vfrm (AJOVY) 225 MG/1.5ML SOSY INJECT 3 SYRINGES ONCE MONTHLY EVERY 3 MONTHS    . HYDROcodone-acetaminophen (NORCO) 7.5-325 MG tablet take 1 tablet by mouth every 8 hours if needed  0  . rizatriptan (MAXALT) 5 MG tablet Take 1 tablet (5 mg total) by mouth as needed for migraine. May repeat in 2 hours if needed 10 tablet 0  . Vitamin D, Ergocalciferol, (DRISDOL) 50000 units CAPS capsule Take 1 capsule (50,000 Units total) by mouth every 7 (seven) days. 12 capsule 0  . ZOMIG 5 MG nasal solution   0   No current facility-administered medications on file prior to visit.    Review of Systems Constitutional: Negative for other unusual diaphoresis, sweats, appetite or weight changes HENT: Negative for other worsening hearing loss, ear pain, facial swelling, mouth sores or neck stiffness.   Eyes: Negative for other worsening pain, redness or other visual disturbance.  Respiratory: Negative for other stridor or swelling Cardiovascular: Negative for other palpitations or other chest pain  Gastrointestinal: Negative for worsening diarrhea or loose stools, blood in stool, distention or other pain Genitourinary: Negative for hematuria, flank pain or other change in urine volume.  Musculoskeletal: Negative for myalgias or other joint swelling.  Skin: Negative for other color change, or other wound or worsening drainage.  Neurological: Negative for other syncope or numbness. Hematological: Negative for other adenopathy or swelling Psychiatric/Behavioral: Negative for hallucinations, other worsening  agitation, SI, self-injury, or new decreased concentration ALl other system neg per pt    Objective:   Physical Exam BP 112/68   Pulse 95   Temp 98.1 F (36.7 C) (Oral)   Ht 5\' 6"  (1.676 m)   Wt 164 lb (74.4 kg)   SpO2 96%   BMI 26.47 kg/m  VS noted,  Constitutional: Pt is oriented to person, place, and time. Appears well-developed and well-nourished, in no significant distress and comfortable Head: Normocephalic and atraumatic  Eyes: Conjunctivae and EOM are normal. Pupils are equal, round, and reactive to light Right Ear: External ear normal without discharge Left Ear: External ear normal without discharge Nose: Nose without discharge or deformity Mouth/Throat: Oropharynx is without other ulcerations and moist  Neck: Normal range of motion. Neck supple. No JVD present. No tracheal deviation present or significant neck LA or mass Cardiovascular: Normal rate, regular rhythm, normal heart sounds and intact distal pulses.   Pulmonary/Chest: WOB normal and breath sounds without rales or wheezing  Abdominal: Soft. Bowel sounds are normal. NT. No HSM  Musculoskeletal: Normal range of motion. Exhibits no edema Lymphadenopathy: Has no other cervical  adenopathy.  Neurological: Pt is alert and oriented to person, place, and time. Pt has normal reflexes. No cranial nerve deficit. Motor grossly intact, Gait intact Skin: Skin is warm and dry. No rash noted or new ulcerations Psychiatric:  Has normal mood and affect. Behavior is normal without agitation No other exam findings  ECG I have personally interpreted today: NSR     Assessment & Plan:

## 2018-02-22 NOTE — Assessment & Plan Note (Addendum)
Etiology unclear, for ecg revieed, for labs as ordered, declines MRI, carotids, echo for now  In addition to the time spent performing CPE, I spent an additional 25 minutes face to face,in which greater than 50% of this time was spent in counseling and coordination of care for patient's acute illness as documented, including the differential dx, treatment, further evaluation and other management of dizziness, hyperglycemia, HLD

## 2018-02-22 NOTE — Assessment & Plan Note (Signed)

## 2018-02-23 LAB — HEPATITIS C ANTIBODY
HEP C AB: NONREACTIVE
SIGNAL TO CUT-OFF: 0.03 (ref <1.00)

## 2018-02-25 ENCOUNTER — Other Ambulatory Visit: Payer: Self-pay | Admitting: Orthopedic Surgery

## 2018-02-25 ENCOUNTER — Ambulatory Visit: Payer: BC Managed Care – PPO | Admitting: Internal Medicine

## 2018-02-25 ENCOUNTER — Other Ambulatory Visit: Payer: Self-pay | Admitting: Specialist

## 2018-02-25 DIAGNOSIS — M5417 Radiculopathy, lumbosacral region: Secondary | ICD-10-CM

## 2018-03-04 ENCOUNTER — Other Ambulatory Visit: Payer: BC Managed Care – PPO

## 2018-03-10 ENCOUNTER — Other Ambulatory Visit: Payer: Self-pay | Admitting: Specialist

## 2018-03-10 ENCOUNTER — Ambulatory Visit
Admission: RE | Admit: 2018-03-10 | Discharge: 2018-03-10 | Disposition: A | Discharge status: Home / Self Care | Payer: BC Managed Care – PPO | Source: Ambulatory Visit | Attending: Specialist | Admitting: Specialist

## 2018-03-10 DIAGNOSIS — M5417 Radiculopathy, lumbosacral region: Secondary | ICD-10-CM

## 2018-03-10 MED ORDER — METHYLPREDNISOLONE ACETATE 40 MG/ML INJ SUSP (RADIOLOG
120.0000 mg | Freq: Once | INTRAMUSCULAR | Status: AC
  Administered 2018-03-10: 120 mg via INTRA_ARTICULAR

## 2018-03-10 MED ORDER — IOPAMIDOL (ISOVUE-M 200) INJECTION 41%
1.0000 mL | Freq: Once | INTRAMUSCULAR | Status: AC
  Administered 2018-03-10: 1 mL via INTRA_ARTICULAR

## 2018-03-10 NOTE — Discharge Instructions (Signed)

## 2018-04-05 ENCOUNTER — Ambulatory Visit: Payer: BC Managed Care – PPO | Admitting: Internal Medicine

## 2018-04-05 ENCOUNTER — Encounter: Payer: Self-pay | Admitting: Internal Medicine

## 2018-04-05 VITALS — BP 122/76 | HR 73 | Temp 98.2°F | Ht 66.0 in | Wt 163.0 lb

## 2018-04-05 DIAGNOSIS — M25532 Pain in left wrist: Secondary | ICD-10-CM | POA: Diagnosis not present

## 2018-04-05 DIAGNOSIS — M7712 Lateral epicondylitis, left elbow: Secondary | ICD-10-CM | POA: Insufficient documentation

## 2018-04-05 DIAGNOSIS — S46312A Strain of muscle, fascia and tendon of triceps, left arm, initial encounter: Secondary | ICD-10-CM

## 2018-04-05 DIAGNOSIS — S46319A Strain of muscle, fascia and tendon of triceps, unspecified arm, initial encounter: Secondary | ICD-10-CM | POA: Insufficient documentation

## 2018-04-05 MED ORDER — HYDROCODONE-ACETAMINOPHEN 5-325 MG PO TABS: 1.0000 | ORAL_TABLET | Freq: Three times a day (TID) | ORAL | 0 refills | Status: DC | PRN

## 2018-04-05 NOTE — Progress Notes (Signed)
Subjective:    Patient ID: Cassandra Gallagher, female    DOB: 1960/09/05, 58 y.o.   MRN: [id number]  HPI  Here after simple trip and fall last Thursday at work, fell forward to hands and arms with pain mostly to left wrist and elbow area, but did not report to work to avoid the paperwork.  Can play piano and guitar, but any twisting off lids, and any pushing or pulling makes it worse; no swelling or bruising, not on anticoagulant.  Takes diclofenac prn a few days per wk.  Didn't sem to help the current pain.  No neck or lower back issues. No further falls.   did have HA tx with imitrex the am of the fall about 1 hr later, but not clear if related.   Past Medical History:  Diagnosis Date  . Anxiety 06/02/2010  . BACK PAIN 07/26/2007  . 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 Cervicalgia 07/26/2007  . 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 DEPRESSIVE DISORDER 07/26/2007  . Grave's disease    hx of Grave's Disease/Hyperthyroidism  . Hx of migraines   . Hyperlipidemia 12/17/2011  . Insomnia 06/02/2010  . Lumbar degenerative disc disease 01/20/2011  . Lump or mass in breast 11/14/2008  . Palpitations 09/21/2008  . UTI 05/11/2007   Past Surgical History:  Procedure Laterality Date  . BREAST BIOPSY Right   . CESAREAN SECTION  10/29/2008   twins  . DILITATION & CURRETTAGE/HYSTROSCOPY WITH VERSAPOINT RESECTION N/A 07/20/2012   Procedure: DILATATION & CURETTAGE/HYSTEROSCOPY WITH VERSAPOINT RESECTION;  Surgeon: Princess Bruins, MD;  Location: [facility];  Service: Gynecology;  Laterality: N/A;  . HYSTEROSCOPY W/D&C  07-20-12    reports that she quit smoking about 21 years ago. She has never used smokeless tobacco. She reports that she does not drink alcohol or use drugs. family history includes Breast cancer (age of onset: 25) in her maternal aunt; Breast cancer (age of onset: 63) in her mother; Cancer in her brother and father; Hypertension in her mother. No Known Allergies Current  Outpatient Medications on File Prior to Visit  Medication Sig Dispense Refill  . albuterol (PROVENTIL HFA;VENTOLIN HFA) 108 (90 Base) MCG/ACT inhaler Inhale 2 puffs into the lungs every 6 (six) hours as needed for wheezing or shortness of breath. 1 Inhaler 5  . clonazePAM (KLONOPIN) 0.5 MG tablet TAKE (1) TABLET TWICE A DAY AS NEEDED. 60 tablet 2  . cyclobenzaprine (FLEXERIL) 5 MG tablet TAKE 1 TABLET THREE TIMES DAILY AS NEEDED FOR MUSCLE SPASMS. 30 tablet 0  . Fremanezumab-vfrm (AJOVY) 225 MG/1.5ML SOSY INJECT 3 SYRINGES ONCE MONTHLY EVERY 3 MONTHS    . rizatriptan (MAXALT) 5 MG tablet Take 1 tablet (5 mg total) by mouth as needed for migraine. May repeat in 2 hours if needed 10 tablet 0  . Vitamin D, Ergocalciferol, (DRISDOL) 50000 units CAPS capsule Take 1 capsule (50,000 Units total) by mouth every 7 (seven) days. 12 capsule 0  . ZOMIG 5 MG nasal solution   0   No current facility-administered medications on file prior to visit.    Review of Systems  Constitutional: Negative for other unusual diaphoresis or sweats HENT: Negative for ear discharge or swelling Eyes: Negative for other worsening visual disturbances Respiratory: Negative for stridor or other swelling  Gastrointestinal: Negative for worsening distension or other blood Genitourinary: Negative for retention or other urinary change Musculoskeletal: Negative for other MSK pain or swelling Skin: Negative for color change or other new lesions Neurological: Negative  for worsening tremors and other numbness  Psychiatric/Behavioral: Negative for worsening agitation or other fatigue All other system neg per pt    Objective:   Physical Exam BP 122/76   Pulse 73   Temp 98.2 F (36.8 C) (Oral)   Ht 5\' 6"  (1.676 m)   Wt 163 lb (73.9 kg)   SpO2 95%   BMI 26.31 kg/m  VS noted,  Constitutional: Pt appears in NAD HENT: Head: NCAT.  Right Ear: External ear normal.  Left Ear: External ear normal.  Eyes: . Pupils are equal,  round, and reactive to light. Conjunctivae and EOM are normal Nose: without d/c or deformity Neck: Neck supple. Gross normal ROM Cardiovascular: Normal rate and regular rhythm.   Pulmonary/Chest: Effort normal and breath sounds without rales or wheezing.  Abd:  Soft, NT, ND, + BS, no organomegaly Left wrist with mild warnth and trace tender swelling but FROM and no bony abnormality Tender to left lateral epicondylar area, and distal left tricep mild tender without swelling  Neurological: Pt is alert. At baseline orientation, motor grossly intact Skin: Skin is warm. No rashes, other new lesions, no LE edema Psychiatric: Pt behavior is normal without agitation  No other exam findings Lab Results  Component Value Date   WBC 4.1 02/22/2018   HGB 14.2 02/22/2018   HCT 41.7 02/22/2018   PLT 280.0 02/22/2018   GLUCOSE 90 02/22/2018   CHOL 255 (H) 02/22/2018   TRIG 261.0 (H) 02/22/2018   HDL 37.30 (L) 02/22/2018   LDLDIRECT 186.0 02/22/2018   LDLCALC 158 (H) 01/31/2014   ALT 19 02/22/2018   AST 16 02/22/2018   NA 141 02/22/2018   K 4.3 02/22/2018   CL 105 02/22/2018   CREATININE 0.65 02/22/2018   BUN 13 02/22/2018   CO2 27 02/22/2018   TSH 0.74 02/22/2018   HGBA1C 5.7 02/22/2018       Assessment & Plan:

## 2018-04-05 NOTE — Assessment & Plan Note (Signed)
Mild post traumatic tendonitis related to the fall; for nsaid prn, consider f/u sports medicine

## 2018-04-05 NOTE — Assessment & Plan Note (Signed)
Mild, for nsaid prn,  to f/u any worsening symptoms or concerns

## 2018-04-05 NOTE — Patient Instructions (Signed)
Please continue all other medications as before, including the diclofenac  Please have the pharmacy call with any other refills you may need.  Please continue your efforts at being more active, low cholesterol diet, and weight control.  Please keep your appointments with your specialists as you may have planned

## 2018-04-05 NOTE — Assessment & Plan Note (Signed)
C/w with very mild post traumatic arthritis related to the fall; doubt significant bony injury, declines films, for nsaid prn

## 2018-04-06 ENCOUNTER — Other Ambulatory Visit: Payer: Self-pay | Admitting: Internal Medicine

## 2018-04-06 NOTE — Telephone Encounter (Signed)
Done erx 

## 2018-04-09 ENCOUNTER — Encounter: Payer: Self-pay | Admitting: Internal Medicine

## 2018-04-09 DIAGNOSIS — M25532 Pain in left wrist: Secondary | ICD-10-CM

## 2018-04-09 DIAGNOSIS — M25522 Pain in left elbow: Secondary | ICD-10-CM

## 2018-04-11 NOTE — Addendum Note (Signed)
Addended by: Biagio Borg on: 04/11/2018 05:16 PM   Modules accepted: Orders

## 2018-04-12 ENCOUNTER — Ambulatory Visit (INDEPENDENT_AMBULATORY_CARE_PROVIDER_SITE_OTHER)
Admission: RE | Admit: 2018-04-12 | Discharge: 2018-04-12 | Disposition: A | Discharge status: Home / Self Care | Payer: BC Managed Care – PPO | Source: Ambulatory Visit | Attending: Internal Medicine | Admitting: Internal Medicine

## 2018-04-12 DIAGNOSIS — M25532 Pain in left wrist: Secondary | ICD-10-CM | POA: Diagnosis not present

## 2018-04-12 DIAGNOSIS — M25522 Pain in left elbow: Secondary | ICD-10-CM

## 2018-04-13 ENCOUNTER — Telehealth: Payer: Self-pay

## 2018-04-13 ENCOUNTER — Other Ambulatory Visit: Payer: Self-pay | Admitting: Internal Medicine

## 2018-04-13 DIAGNOSIS — S52102D Unspecified fracture of upper end of left radius, subsequent encounter for closed fracture with routine healing: Secondary | ICD-10-CM

## 2018-04-13 NOTE — Telephone Encounter (Signed)
Pt has viewed results via MyChart  

## 2018-04-13 NOTE — Telephone Encounter (Signed)
-----   Message from Biagio Borg, MD sent at 04/13/2018  9:14 AM EST ----- Left message on MyChart, pt to cont same tx except  The test results show that your current treatment is OK, as although your left wrist xray was OK, the left elbow did show a non displaced fracture.  We should refer you to Orthopedic asap.  You should hear soon,.    Shirron to please inform pt, I will do referral

## 2018-04-14 ENCOUNTER — Encounter: Payer: Self-pay | Admitting: Internal Medicine

## 2018-04-19 ENCOUNTER — Encounter: Payer: Self-pay | Admitting: Internal Medicine

## 2018-04-19 MED ORDER — TRAMADOL HCL 50 MG PO TABS: 50.0000 mg | ORAL_TABLET | Freq: Four times a day (QID) | ORAL | 0 refills | Status: DC | PRN

## 2018-04-20 ENCOUNTER — Encounter: Payer: Self-pay | Admitting: Internal Medicine

## 2018-04-22 ENCOUNTER — Encounter: Payer: Self-pay | Admitting: Internal Medicine

## 2018-04-25 ENCOUNTER — Ambulatory Visit (INDEPENDENT_AMBULATORY_CARE_PROVIDER_SITE_OTHER): Payer: BC Managed Care – PPO | Admitting: *Deleted

## 2018-04-25 ENCOUNTER — Other Ambulatory Visit: Payer: Self-pay

## 2018-04-25 DIAGNOSIS — Z23 Encounter for immunization: Secondary | ICD-10-CM | POA: Diagnosis not present

## 2018-05-16 ENCOUNTER — Encounter: Payer: Self-pay | Admitting: Internal Medicine

## 2018-05-16 MED ORDER — TRAMADOL HCL 50 MG PO TABS: 50.0000 mg | ORAL_TABLET | Freq: Four times a day (QID) | ORAL | 1 refills | Status: DC | PRN

## 2018-05-16 MED ORDER — ATORVASTATIN CALCIUM 20 MG PO TABS: 20.0000 mg | ORAL_TABLET | Freq: Every day | ORAL | 3 refills | Status: DC

## 2018-05-25 ENCOUNTER — Other Ambulatory Visit: Payer: Self-pay | Admitting: Family Medicine

## 2018-05-25 DIAGNOSIS — S63502A Unspecified sprain of left wrist, initial encounter: Secondary | ICD-10-CM

## 2018-06-02 ENCOUNTER — Ambulatory Visit
Admission: RE | Admit: 2018-06-02 | Discharge: 2018-06-02 | Disposition: A | Discharge status: Home / Self Care | Payer: BC Managed Care – PPO | Source: Ambulatory Visit | Attending: Family Medicine | Admitting: Family Medicine

## 2018-06-02 ENCOUNTER — Other Ambulatory Visit: Payer: Self-pay

## 2018-06-02 DIAGNOSIS — S63502A Unspecified sprain of left wrist, initial encounter: Secondary | ICD-10-CM

## 2018-07-06 ENCOUNTER — Other Ambulatory Visit: Payer: Self-pay | Admitting: Internal Medicine

## 2018-07-06 NOTE — Telephone Encounter (Signed)
Done erx 

## 2018-07-16 ENCOUNTER — Other Ambulatory Visit: Payer: Self-pay

## 2018-07-16 DIAGNOSIS — Z20822 Contact with and (suspected) exposure to covid-19: Secondary | ICD-10-CM

## 2018-07-18 LAB — NOVEL CORONAVIRUS, NAA: SARS-CoV-2, NAA: NOT DETECTED

## 2018-07-29 ENCOUNTER — Other Ambulatory Visit: Payer: Self-pay | Admitting: Internal Medicine

## 2018-07-29 DIAGNOSIS — Z1231 Encounter for screening mammogram for malignant neoplasm of breast: Secondary | ICD-10-CM

## 2018-08-17 ENCOUNTER — Encounter: Payer: Self-pay | Admitting: Internal Medicine

## 2018-08-17 NOTE — Telephone Encounter (Signed)
[place] for staff to contact pt to make appt for husband

## 2018-08-18 NOTE — Telephone Encounter (Signed)
Notified wife made appt for husband...Cassandra Gallagher

## 2018-09-08 ENCOUNTER — Other Ambulatory Visit: Payer: Self-pay

## 2018-09-08 ENCOUNTER — Ambulatory Visit
Admission: RE | Admit: 2018-09-08 | Discharge: 2018-09-08 | Disposition: A | Discharge status: Home / Self Care | Payer: BC Managed Care – PPO | Source: Ambulatory Visit | Attending: Internal Medicine | Admitting: Internal Medicine

## 2018-09-08 DIAGNOSIS — Z1231 Encounter for screening mammogram for malignant neoplasm of breast: Secondary | ICD-10-CM

## 2018-09-09 ENCOUNTER — Encounter: Payer: Self-pay | Admitting: Internal Medicine

## 2018-09-09 MED ORDER — METHOCARBAMOL 500 MG PO TABS: 500.0000 mg | ORAL_TABLET | Freq: Four times a day (QID) | ORAL | 2 refills | Status: DC | PRN

## 2018-09-09 NOTE — Telephone Encounter (Signed)
Done erx 

## 2018-09-12 ENCOUNTER — Other Ambulatory Visit: Payer: Self-pay | Admitting: Internal Medicine

## 2018-09-12 DIAGNOSIS — R928 Other abnormal and inconclusive findings on diagnostic imaging of breast: Secondary | ICD-10-CM

## 2018-09-14 ENCOUNTER — Ambulatory Visit
Admission: RE | Admit: 2018-09-14 | Discharge: 2018-09-14 | Disposition: A | Discharge status: Home / Self Care | Payer: BC Managed Care – PPO | Source: Ambulatory Visit | Attending: Internal Medicine | Admitting: Internal Medicine

## 2018-09-14 ENCOUNTER — Other Ambulatory Visit: Payer: Self-pay

## 2018-09-14 DIAGNOSIS — R928 Other abnormal and inconclusive findings on diagnostic imaging of breast: Secondary | ICD-10-CM

## 2018-09-22 ENCOUNTER — Encounter: Payer: Self-pay | Admitting: Internal Medicine

## 2018-09-23 ENCOUNTER — Telehealth: Payer: Self-pay | Admitting: Internal Medicine

## 2018-09-23 DIAGNOSIS — R928 Other abnormal and inconclusive findings on diagnostic imaging of breast: Secondary | ICD-10-CM

## 2018-09-23 NOTE — Telephone Encounter (Signed)
Copied from [place] [id number]. Topic: General - Other >> Sep 23, 2018  2:36 PM Keene Breath wrote: Reason for CRM: Patient called to request an MRI of her breast.  She went to [facility] imaging and they stated that she would need a referral.  Please advise and call patient to discuss.

## 2018-09-23 NOTE — Telephone Encounter (Signed)
Ok this is done 

## 2018-09-23 NOTE — Telephone Encounter (Signed)
Ok MRI has been ordered

## 2018-09-27 ENCOUNTER — Encounter: Payer: Self-pay | Admitting: Internal Medicine

## 2018-10-06 ENCOUNTER — Other Ambulatory Visit: Payer: Self-pay

## 2018-10-07 ENCOUNTER — Encounter: Payer: Self-pay | Admitting: Obstetrics & Gynecology

## 2018-10-07 ENCOUNTER — Ambulatory Visit: Payer: BC Managed Care – PPO | Admitting: Obstetrics & Gynecology

## 2018-10-07 VITALS — BP 120/84 | Ht 65.75 in | Wt 161.0 lb

## 2018-10-07 DIAGNOSIS — Z13820 Encounter for screening for osteoporosis: Secondary | ICD-10-CM | POA: Diagnosis not present

## 2018-10-07 DIAGNOSIS — Z780 Asymptomatic menopausal state: Secondary | ICD-10-CM

## 2018-10-07 DIAGNOSIS — Z01419 Encounter for gynecological examination (general) (routine) without abnormal findings: Secondary | ICD-10-CM | POA: Diagnosis not present

## 2018-10-07 NOTE — Progress Notes (Signed)
Cassandra Gallagher 08-29-1960 [id number]   History:    58 y.o. [id number] married.  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  RP:  Established patient presenting for annual gyn exam   HPI: Postmenopausal, well on no hormone replacement therapy for the last 4 years.  No postmenopausal bleeding.  No pelvic pain.  No pain with intercourse.  Urine and bowel movements normal.  Breasts normal.  Body mass index 26.18.  Started back playing tennis and cycles.  Health labs with family physician.  Colonoscopy 2018.  Past medical history,surgical history, family history and social history were all reviewed and documented in the EPIC chart.  Gynecologic History Postmenopausal Contraception: post menopausal status Last Pap: 12/2014. Results were: Negative Last mammogram: 09/2018. Results were: Dilated duct.  MRI of Breast scheduled Bone Density: Will schedule here now. Colonoscopy: 2018  Obstetric History OB History  Gravida Para Term Preterm AB Living  6 3     3 3   SAB TAB Ectopic Multiple Live Births  3            # Outcome Date GA Lbr Len/2nd Weight Sex Delivery Anes PTL Lv  6 SAB           5 SAB           4 SAB           3 Para           2 Para           1 Para              ROS: A ROS was performed and pertinent positives and negatives are included in the history.  GENERAL: No fevers or chills. HEENT: No change in vision, no earache, sore throat or sinus congestion. NECK: No pain or stiffness. CARDIOVASCULAR: No chest pain or pressure. No palpitations. PULMONARY: No shortness of breath, cough or wheeze. GASTROINTESTINAL: No abdominal pain, nausea, vomiting or diarrhea, melena or bright red blood per rectum. GENITOURINARY: No urinary frequency, urgency, hesitancy or dysuria. MUSCULOSKELETAL: No joint or muscle pain, no back pain, no recent trauma. DERMATOLOGIC: No rash, no itching, no lesions. ENDOCRINE: No polyuria, polydipsia, no heat or cold intolerance. No recent change in weight.  HEMATOLOGICAL: No anemia or easy bruising or bleeding. NEUROLOGIC: No headache, seizures, numbness, tingling or weakness. PSYCHIATRIC: No depression, no loss of interest in normal activity or change in sleep pattern.     Exam:   BP 120/84   Ht 5' 5.75" (1.67 m)   Wt 161 lb (73 kg)   BMI 26.18 kg/m   Body mass index is 26.18 kg/m.  General appearance : Well developed well nourished female. No acute distress HEENT: Eyes: no retinal hemorrhage or exudates,  Neck supple, trachea midline, no carotid bruits, no thyroidmegaly Lungs: Clear to auscultation, no rhonchi or wheezes, or rib retractions  Heart: Regular rate and rhythm, no murmurs or gallops Breast:Examined in sitting and supine position were symmetrical in appearance, no palpable masses or tenderness,  no skin retraction, no nipple inversion, no nipple discharge, no skin discoloration, no axillary or supraclavicular lymphadenopathy Abdomen: no palpable masses or tenderness, no rebound or guarding Extremities: no edema or skin discoloration or tenderness  Pelvic: Vulva: Normal             Vagina: No gross lesions or discharge  Cervix: No gross lesions or discharge.  Pap reflex done.  Uterus AV, normal size, shape and consistency, non-tender and mobile  Adnexa  Without masses or tenderness  Anus: Normal   Assessment/Plan:  58 y.o. female for annual exam   1. Encounter for routine gynecological examination with Papanicolaou smear of cervix Normal gynecologic exam in menopause.  Pap reflex done.  Breast exam normal.  Recent diagnostic mammogram August 2020 showed a dilated duct, breast MRI scheduled.  Body mass index 26.18, continue with fitness and healthy nutrition.  Colonoscopy 2018.  Health labs with family physician.  2. Postmenopause Well on no hormone replacement therapy.  No postmenopausal bleeding.  3. Screening for osteoporosis Schedule bone density here now.  Vitamin D supplements recommended, calcium intake of 1200  mg daily and regular weightbearing physical activities. - DG Bone Density; Future  Princess Bruins MD, 12:15 PM 10/07/2018

## 2018-10-07 NOTE — Addendum Note (Signed)
Addended by: Thurnell Garbe A on: 10/07/2018 12:46 PM   Modules accepted: Orders

## 2018-10-07 NOTE — Patient Instructions (Signed)
1. Encounter for routine gynecological examination with Papanicolaou smear of cervix Normal gynecologic exam in menopause.  Pap reflex done.  Breast exam normal.  Recent diagnostic mammogram August 2020 showed a dilated duct, breast MRI scheduled.  Body mass index 26.18, continue with fitness and healthy nutrition.  Colonoscopy 2018.  Health labs with family physician.  2. Postmenopause Well on no hormone replacement therapy.  No postmenopausal bleeding.  3. Screening for osteoporosis Schedule bone density here now.  Vitamin D supplements recommended, calcium intake of 1200 mg daily and regular weightbearing physical activities. - DG Bone Density; Future  Cassandra Gallagher, it was a pleasure seeing you today!  I will inform you of your results as soon as they are available.

## 2018-10-10 LAB — PAP IG W/ RFLX HPV ASCU

## 2018-10-11 ENCOUNTER — Other Ambulatory Visit: Payer: Self-pay | Admitting: Internal Medicine

## 2018-10-11 NOTE — Telephone Encounter (Signed)
Done erx 

## 2018-10-18 ENCOUNTER — Encounter: Payer: Self-pay | Admitting: Internal Medicine

## 2018-10-18 MED ORDER — TRAMADOL HCL 50 MG PO TABS: 50.0000 mg | ORAL_TABLET | Freq: Four times a day (QID) | ORAL | 1 refills | Status: DC | PRN

## 2018-10-26 ENCOUNTER — Other Ambulatory Visit: Payer: Self-pay

## 2018-10-26 ENCOUNTER — Other Ambulatory Visit: Payer: Self-pay | Admitting: Internal Medicine

## 2018-10-26 ENCOUNTER — Ambulatory Visit
Admission: RE | Admit: 2018-10-26 | Discharge: 2018-10-26 | Disposition: A | Discharge status: Home / Self Care | Payer: BC Managed Care – PPO | Source: Ambulatory Visit | Attending: Internal Medicine | Admitting: Internal Medicine

## 2018-10-26 ENCOUNTER — Encounter: Payer: Self-pay | Admitting: Internal Medicine

## 2018-10-26 DIAGNOSIS — R928 Other abnormal and inconclusive findings on diagnostic imaging of breast: Secondary | ICD-10-CM

## 2018-10-26 MED ORDER — GADOBUTROL 1 MMOL/ML IV SOLN
8.0000 mL | Freq: Once | INTRAVENOUS | Status: AC | PRN
  Administered 2018-10-26: 8 mL via INTRAVENOUS

## 2018-10-27 ENCOUNTER — Telehealth: Payer: Self-pay

## 2018-10-27 ENCOUNTER — Other Ambulatory Visit: Payer: Self-pay | Admitting: Internal Medicine

## 2018-10-27 DIAGNOSIS — R928 Other abnormal and inconclusive findings on diagnostic imaging of breast: Secondary | ICD-10-CM

## 2018-10-27 NOTE — Telephone Encounter (Signed)
Pt has viewed results via MyChart  

## 2018-10-27 NOTE — Telephone Encounter (Signed)
-----   Message from Biagio Borg, MD sent at 10/26/2018  7:26 PM EDT ----- Madaline Brilliant for Deseree Zemaitis to let pt know -   The tests have been ordered by me for her per the radiologist recommendation

## 2018-11-01 ENCOUNTER — Other Ambulatory Visit: Payer: Self-pay

## 2018-11-01 ENCOUNTER — Other Ambulatory Visit: Payer: Self-pay | Admitting: Body Imaging

## 2018-11-01 ENCOUNTER — Ambulatory Visit
Admission: RE | Admit: 2018-11-01 | Discharge: 2018-11-01 | Disposition: A | Discharge status: Home / Self Care | Payer: BC Managed Care – PPO | Source: Ambulatory Visit | Attending: Internal Medicine | Admitting: Internal Medicine

## 2018-11-01 ENCOUNTER — Encounter: Payer: Self-pay | Admitting: Internal Medicine

## 2018-11-01 DIAGNOSIS — R928 Other abnormal and inconclusive findings on diagnostic imaging of breast: Secondary | ICD-10-CM

## 2018-11-01 MED ORDER — TRAMADOL HCL 50 MG PO TABS: 50.0000 mg | ORAL_TABLET | Freq: Four times a day (QID) | ORAL | 1 refills | Status: DC | PRN

## 2018-11-02 ENCOUNTER — Other Ambulatory Visit: Payer: Self-pay | Admitting: Internal Medicine

## 2018-11-02 DIAGNOSIS — R928 Other abnormal and inconclusive findings on diagnostic imaging of breast: Secondary | ICD-10-CM

## 2018-11-07 ENCOUNTER — Encounter: Payer: Self-pay | Admitting: Internal Medicine

## 2018-11-15 ENCOUNTER — Other Ambulatory Visit: Payer: Self-pay

## 2018-11-15 ENCOUNTER — Ambulatory Visit
Admission: RE | Admit: 2018-11-15 | Discharge: 2018-11-15 | Disposition: A | Discharge status: Home / Self Care | Payer: BC Managed Care – PPO | Source: Ambulatory Visit | Attending: Internal Medicine | Admitting: Internal Medicine

## 2018-11-15 ENCOUNTER — Other Ambulatory Visit (HOSPITAL_COMMUNITY): Payer: Self-pay | Admitting: Diagnostic Radiology

## 2018-11-15 DIAGNOSIS — R928 Other abnormal and inconclusive findings on diagnostic imaging of breast: Secondary | ICD-10-CM

## 2018-11-15 MED ORDER — GADOBUTROL 1 MMOL/ML IV SOLN
8.0000 mL | Freq: Once | INTRAVENOUS | Status: AC | PRN
  Administered 2018-11-15: 8 mL via INTRAVENOUS

## 2018-12-27 ENCOUNTER — Other Ambulatory Visit: Payer: Self-pay | Admitting: Internal Medicine

## 2018-12-27 ENCOUNTER — Encounter: Payer: Self-pay | Admitting: Internal Medicine

## 2018-12-27 NOTE — Telephone Encounter (Signed)
Staff to call to help make appt with Dr Tamala Julian for MSK pain please

## 2018-12-28 NOTE — Telephone Encounter (Signed)
Pt scheduled  

## 2018-12-30 ENCOUNTER — Encounter: Payer: Self-pay | Admitting: Family Medicine

## 2018-12-30 ENCOUNTER — Other Ambulatory Visit: Payer: Self-pay

## 2018-12-30 ENCOUNTER — Ambulatory Visit: Payer: BC Managed Care – PPO | Admitting: Family Medicine

## 2018-12-30 ENCOUNTER — Ambulatory Visit: Payer: Self-pay

## 2018-12-30 ENCOUNTER — Ambulatory Visit (INDEPENDENT_AMBULATORY_CARE_PROVIDER_SITE_OTHER): Payer: BC Managed Care – PPO | Admitting: Family Medicine

## 2018-12-30 VITALS — BP 102/68 | HR 68 | Ht 65.75 in | Wt 162.4 lb

## 2018-12-30 DIAGNOSIS — M21611 Bunion of right foot: Secondary | ICD-10-CM | POA: Diagnosis not present

## 2018-12-30 DIAGNOSIS — M25521 Pain in right elbow: Secondary | ICD-10-CM | POA: Diagnosis not present

## 2018-12-30 DIAGNOSIS — R202 Paresthesia of skin: Secondary | ICD-10-CM

## 2018-12-30 DIAGNOSIS — M7711 Lateral epicondylitis, right elbow: Secondary | ICD-10-CM

## 2018-12-30 NOTE — Progress Notes (Signed)
I, [name], LAT, ATC, am serving as scribe for Dr. Lynne Leader.  Cassandra Gallagher is a 58 y.o. female who presents to [place] today for c/o R elbow pain and R foot pain x 6 months that began after she started playing tennis.     Pt rates her R elbow pain as a 6/10 that she describes as a jabbing pain.  She notes minimal radiating pain into the elbow and just distal to the elbow.  Pain is located predominantly lateral elbow.  She denies any swelling.  Her symptoms are aggravated w/ tennis, particularly w/ forehand stroke and serves.  She has tried heat.  She denies any injury or fall.  No locking catching sensation.  R foot - Pt notes pain along her R medial great toe and then notes numbness in her R lateral toes.  She rates her foot pain as 8/10 that she describes as constant aching/throbbing pain.  Pain is worse with activity.  Pain mostly had medial aspect of great toe.  She has been trying to change her footwear to allow for more room at the ball of her foot due to having a wide foot.    ROS:  As above  Exam:  BP 102/68 (BP Location: Left Arm, Patient Position: Sitting, Cuff Size: Normal)   Pulse 68   Ht 5' 5.75" (1.67 m)   Wt 162 lb 6.4 oz (73.7 kg)   SpO2 94%   BMI 26.41 kg/m  Wt Readings from Last 5 Encounters:  12/30/18 162 lb 6.4 oz (73.7 kg)  10/07/18 161 lb (73 kg)  04/05/18 163 lb (73.9 kg)  02/22/18 164 lb (74.4 kg)  01/28/18 163 lb (73.9 kg)   General: Well Developed, well nourished, and in no acute distress.  Neuro/Psych: Alert and oriented x3, extra-ocular muscles intact, able to move all 4 extremities, sensation grossly intact. Skin: Warm and dry, no rashes noted.  Respiratory: Not using accessory muscles, speaking in full sentences, trachea midline.  Cardiovascular: Pulses palpable, no extremity edema. Abdomen: Does not appear distended. MSK:  Right elbow: Normal-appearing Normal motion Tender palpation mild pain lateral  epicondyle.  Nontender otherwise. Normal strength. Pain with resisted wrist extension.  Pulses cap refill and sensation intact distal bilateral upper extremities.  Right foot: Mild bunion and bunionette formation.  Mild foot pronation with standing.  Otherwise normal-appearing Normal foot and ankle motion. Normal strength. Mildly tender palpation medial aspect of first MTP. Pulses cap refill and sensation intact distally.  L-spine: Nontender normal motion negative slump test bilaterally.    Lab and Radiology Results  EXAM: MRI LUMBAR SPINE WITHOUT CONTRAST dated January 22, 2018  TECHNIQUE: Multiplanar, multisequence MR imaging of the lumbar spine was performed. No intravenous contrast was administered.  COMPARISON:  CT abdomen and pelvis December 15, 2016  FINDINGS: SEGMENTATION: For the purposes of this report, the last well-formed intervertebral disc is reported as L5-S1.  ALIGNMENT: Relatively straight lumbar lordosis.  No malalignment.  VERTEBRAE:Vertebral bodies intact. Moderate to severe L4-5 and L5-S1 disc height loss with disc desiccation L3-4 through L5-S1. Moderate subacute discogenic endplate changes 075-GRM, 075-GRM. Acute L4 inferior endplate Schmorl's node. No suspicious bone marrow signal.  CONUS MEDULLARIS AND CAUDA EQUINA: Conus medullaris terminates at T12-L1 and demonstrates normal morphology and signal characteristics. Cauda equina is normal.  PARASPINAL AND OTHER SOFT TISSUES: Included prevertebral and paraspinal soft tissues are normal.  DISC LEVELS:  T12-L1 and L1-2: No disc bulge, canal stenosis nor neural foraminal narrowing.  L2-3:  Annular bulging without canal stenosis or neural foraminal narrowing. Mild facet arthropathy.  L3-4: Small broad-based disc bulge and central disc protrusion with annular fissure. Minimal facet arthropathy and ligamentum flavum redundancy. Mild canal stenosis. No neural foraminal narrowing.  L4-5:  Small broad-based disc bulge asymmetric to the RIGHT encroaches upon the exited RIGHT L4 nerve. Mild facet arthropathy. No canal stenosis. Mild RIGHT neural foraminal narrowing.  L5-S1: Small broad-based disc bulge. Moderate facet arthropathy without canal stenosis. Mild-to-moderate RIGHT and mild LEFT neural foraminal narrowing.  IMPRESSION: 1. No fracture or malalignment. Degenerative change of the lower lumbar spine. 2. Mild canal stenosis L3-4. 3. Neural foraminal narrowing L4-5 and L5-S1: Mild to moderate on the RIGHT at L5-S1 in addition to encroachment upon the exited RIGHT L4 nerve.   Electronically Signed   By: Elon Alas M.D.   On: 01/22/2018 22:08  I, Lynne Leader, personally (independently) visualized and performed the interpretation of the images attached in this note.   Assessment and Plan: 58 y.o. female with  Right lateral elbow pain.  Lateral epicondylitis.  Discussed treatment options.  Plan for diclofenac gel eccentric exercises.  Additionally will use tennis elbow strap.  If not improving would consider nitroglycerin patch protocol but would like to avoid nitroglycerin given her migraine history.  Right foot pain and foot numbness.  Foot pain: Pain at medial aspect of first MTP likely bunion related compression.  Proceed with shoe modification and scaphoid pads.  Also use diclofenac gel and toe spacer.  Recheck back in near future if not improving.  Will bring shoes at next visit if needed.  Foot numbness: Etiology somewhat unclear.  Distribution is somewhat consistent with an L5 however she does not have pain more proximally was like this less likely.  Certainly could be more peripheral foot compression.  Discussed shoe fitment.  Plan to proceed with scaphoid pad.     Historical information moved to improve visibility of documentation.  Past Medical History:  Diagnosis Date  . Anxiety 06/02/2010  . BACK PAIN 07/26/2007  . Cervical spine  degeneration 01/20/2011  . Cervicalgia 07/26/2007  . 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 DEPRESSIVE DISORDER 07/26/2007  . Grave's disease    hx of Grave's Disease/Hyperthyroidism  . Hx of migraines   . Hyperlipidemia 12/17/2011  . Insomnia 06/02/2010  . Lumbar degenerative disc disease 01/20/2011  . Lump or mass in breast 11/14/2008  . Palpitations 09/21/2008  . UTI 05/11/2007   Past Surgical History:  Procedure Laterality Date  . BREAST BIOPSY Right   . CESAREAN SECTION  10/29/2008   twins  . DILITATION & CURRETTAGE/HYSTROSCOPY WITH VERSAPOINT RESECTION N/A 07/20/2012   Procedure: DILATATION & CURETTAGE/HYSTEROSCOPY WITH VERSAPOINT RESECTION;  Surgeon: Princess Bruins, MD;  Location: [facility];  Service: Gynecology;  Laterality: N/A;  . HYSTEROSCOPY W/D&C  07-20-12   Social History   Tobacco Use  . Smoking status: Former Smoker    Quit date: 11/24/1996    Years since quitting: 22.1  . Smokeless tobacco: Never Used  . Tobacco comment: smoked for 6 years, quit 15 years ago  Substance Use Topics  . Alcohol use: No   family history includes Breast cancer (age of onset: 7) in her maternal aunt; Breast cancer (age of onset: 105) in her mother; Cancer in her brother and father; Hypertension in her mother.  Medications: Current Outpatient Medications  Medication Sig Dispense Refill  . atorvastatin (LIPITOR) 20 MG tablet Take 1 tablet (20 mg total) by mouth daily. [place]  tablet 3  . clonazePAM (KLONOPIN) 0.5 MG tablet TAKE (1) TABLET TWICE A DAY AS NEEDED. 60 tablet 2  . Fremanezumab-vfrm (AJOVY) 225 MG/1.5ML SOSY INJECT 3 SYRINGES ONCE MONTHLY EVERY 3 MONTHS    . methocarbamol (ROBAXIN) 500 MG tablet TAKE 1 TABLET EVERY 6 HOURS AS NEEDED FOR MUSCLE SPASM. 60 tablet 0  . ZOMIG 5 MG nasal solution   0  . albuterol (PROVENTIL HFA;VENTOLIN HFA) 108 (90 Base) MCG/ACT inhaler Inhale 2 puffs into the lungs every 6 (six) hours as needed for wheezing or shortness of breath. (Patient  not taking: Reported on 12/30/2018) 1 Inhaler 5  . rizatriptan (MAXALT) 5 MG tablet Take 1 tablet (5 mg total) by mouth as needed for migraine. May repeat in 2 hours if needed (Patient not taking: Reported on 12/30/2018) 10 tablet 0  . traMADol (ULTRAM) 50 MG tablet Take 1 tablet (50 mg total) by mouth every 6 (six) hours as needed. (Patient not taking: Reported on 12/30/2018) 60 tablet 1  . Vitamin D, Ergocalciferol, (DRISDOL) 50000 units CAPS capsule Take 1 capsule (50,000 Units total) by mouth every 7 (seven) days. (Patient not taking: Reported on 12/30/2018) 12 capsule 0   No current facility-administered medications for this visit.    No Known Allergies    Discussed warning signs or symptoms. Please see discharge instructions. Patient expresses understanding.  The above documentation has been reviewed and is accurate and complete Lynne Leader

## 2018-12-30 NOTE — Progress Notes (Deleted)
I, [name], LAT, ATC, am serving as scribe for Dr. Lynne Leader.  Cassandra Gallagher is a 58 y.o. female who presents to [facility] today for  Arm pain x 6 months that began after starting to play tennis.    ROS:  As above  Exam:  There were no vitals taken for this visit. Wt Readings from Last 5 Encounters:  10/07/18 161 lb (73 kg)  04/05/18 163 lb (73.9 kg)  02/22/18 164 lb (74.4 kg)  01/28/18 163 lb (73.9 kg)  07/14/17 162 lb (73.5 kg)   General: Well Developed, well nourished, and in no acute distress.  Neuro/Psych: Alert and oriented x3, extra-ocular muscles intact, able to move all 4 extremities, sensation grossly intact. Skin: Warm and dry, no rashes noted.  Respiratory: Not using accessory muscles, speaking in full sentences, trachea midline.  Cardiovascular: Pulses palpable, no extremity edema. Abdomen: Does not appear distended. MSK: ***    Lab and Radiology Results No results found for this or any previous visit (from the past 72 hour(s)). No results found.     Assessment and Plan: 58 y.o. female with ***   PDMP not reviewed this encounter. No orders of the defined types were placed in this encounter.  No orders of the defined types were placed in this encounter.   Historical information moved to improve visibility of documentation.  Past Medical History:  Diagnosis Date  . Anxiety 06/02/2010  . BACK PAIN 07/26/2007  . 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 Cervicalgia 07/26/2007  . 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 DEPRESSIVE DISORDER 07/26/2007  . Grave's disease    hx of Grave's Disease/Hyperthyroidism  . Hx of migraines   . Hyperlipidemia 12/17/2011  . Insomnia 06/02/2010  . Lumbar degenerative disc disease 01/20/2011  . Lump or mass in breast 11/14/2008  . Palpitations 09/21/2008  . UTI 05/11/2007   Past Surgical History:  Procedure Laterality Date  . BREAST BIOPSY Right   . CESAREAN SECTION   10/29/2008   twins  . DILITATION & CURRETTAGE/HYSTROSCOPY WITH VERSAPOINT RESECTION N/A 07/20/2012   Procedure: DILATATION & CURETTAGE/HYSTEROSCOPY WITH VERSAPOINT RESECTION;  Surgeon: Princess Bruins, MD;  Location: [facility];  Service: Gynecology;  Laterality: N/A;  . HYSTEROSCOPY W/D&C  07-20-12   Social History   Tobacco Use  . Smoking status: Former Smoker    Quit date: 11/24/1996    Years since quitting: 22.1  . Smokeless tobacco: Never Used  . Tobacco comment: smoked for 6 years, quit 15 years ago  Substance Use Topics  . Alcohol use: No   family history includes Breast cancer (age of onset: 8) in her maternal aunt; Breast cancer (age of onset: 67) in her mother; Cancer in her brother and father; Hypertension in her mother.  Medications: Current Outpatient Medications  Medication Sig Dispense Refill  . albuterol (PROVENTIL HFA;VENTOLIN HFA) 108 (90 Base) MCG/ACT inhaler Inhale 2 puffs into the lungs every 6 (six) hours as needed for wheezing or shortness of breath. 1 Inhaler 5  . atorvastatin (LIPITOR) 20 MG tablet Take 1 tablet (20 mg total) by mouth daily. 90 tablet 3  . clonazePAM (KLONOPIN) 0.5 MG tablet TAKE (1) TABLET TWICE A DAY AS NEEDED. 60 tablet 2  . Fremanezumab-vfrm (AJOVY) 225 MG/1.5ML SOSY INJECT 3 SYRINGES ONCE MONTHLY EVERY 3 MONTHS    . methocarbamol (ROBAXIN) 500 MG tablet TAKE 1 TABLET EVERY 6 HOURS AS NEEDED FOR MUSCLE SPASM. 60 tablet 0  . rizatriptan (MAXALT) 5 MG tablet  Take 1 tablet (5 mg total) by mouth as needed for migraine. May repeat in 2 hours if needed 10 tablet 0  . traMADol (ULTRAM) 50 MG tablet Take 1 tablet (50 mg total) by mouth every 6 (six) hours as needed. 60 tablet 1  . Vitamin D, Ergocalciferol, (DRISDOL) 50000 units CAPS capsule Take 1 capsule (50,000 Units total) by mouth every 7 (seven) days. 12 capsule 0  . ZOMIG 5 MG nasal solution   0   No current facility-administered medications for this visit.    No Known Allergies     Discussed warning signs or symptoms. Please see discharge instructions. Patient expresses understanding.  ***

## 2018-12-30 NOTE — Patient Instructions (Addendum)
Please perform the exercise program that we have prepared for you and gone over in detail on a daily basis.  In addition to the handout you were provided you can access your program through: [email or web address]   Your unique program code is: [id number]  Please try the Diclofenac (Voltaren) gel.  If your insurance doesn't cover it, you may purchase the Voltaren gel over the counter.  Please look into getting some silicone toe spacers.   For the elbow use the voltaren gel over the counter.  Use the tennis elbow strap or counter force strap Do the exercises.   For the foot use the small scaphoid pads from [employer] Use the voltaren gel.  Try the toe spacers Discussed shoe modification.    Recheck back in about 1 month  We're moving!  Dr. Clovis Riley new office will be located at [address] on the 1st floor.  This location is across the street from the [employer] and in the same complex as the [facility] and [employer]  Our new office phone number will be [phone].  We anticipate beginning to see patients at the [facility] office in early December 2020.

## 2019-01-08 ENCOUNTER — Other Ambulatory Visit: Payer: Self-pay | Admitting: Internal Medicine

## 2019-01-09 NOTE — Telephone Encounter (Signed)
Done erx 

## 2019-01-15 ENCOUNTER — Encounter: Payer: Self-pay | Admitting: Internal Medicine

## 2019-01-16 ENCOUNTER — Ambulatory Visit (INDEPENDENT_AMBULATORY_CARE_PROVIDER_SITE_OTHER): Payer: BC Managed Care – PPO | Admitting: Internal Medicine

## 2019-01-16 ENCOUNTER — Encounter: Payer: Self-pay | Admitting: Internal Medicine

## 2019-01-16 ENCOUNTER — Other Ambulatory Visit: Payer: Self-pay

## 2019-01-16 DIAGNOSIS — R739 Hyperglycemia, unspecified: Secondary | ICD-10-CM | POA: Diagnosis not present

## 2019-01-16 DIAGNOSIS — M545 Low back pain, unspecified: Secondary | ICD-10-CM

## 2019-01-16 DIAGNOSIS — F329 Major depressive disorder, single episode, unspecified: Secondary | ICD-10-CM

## 2019-01-16 DIAGNOSIS — F32A Depression, unspecified: Secondary | ICD-10-CM

## 2019-01-16 MED ORDER — HYDROCODONE-ACETAMINOPHEN 5-325 MG PO TABS: 1.0000 | ORAL_TABLET | Freq: Four times a day (QID) | ORAL | 0 refills | Status: DC | PRN

## 2019-01-16 NOTE — Patient Instructions (Signed)
Please take all new medication as prescribed - the hydrocodone  Please continue all other medications as before, and refills have been done if requested.  Please have the pharmacy call with any other refills you may need.  Please continue your efforts at being more active, low cholesterol diet, and weight control.  Please keep your appointments with your specialists as you may have planned

## 2019-01-16 NOTE — Assessment & Plan Note (Signed)
stable overall by history and exam, recent data reviewed with pt, and pt to continue medical treatment as before,  to f/u any worsening symptoms or concerns  

## 2019-01-16 NOTE — Assessment & Plan Note (Signed)
todays pain primarily due to msk strain though does have underlying lumbar spondylosis, for limited hydrocodone prn, and robaxin prn,  to f/u any worsening symptoms or concerns

## 2019-01-16 NOTE — Progress Notes (Signed)
Subjective:    Patient ID: Cassandra Gallagher, female    DOB: 1960/09/03, 58 y.o.   MRN: [id number]  HPI   Here with c/o acute onset LBPl sharp, across the back eft > right, starting after tried to lift a heavy object, but Pt no bowel or bladder change, fever, wt loss,  worsening LE pain/numbness/weakness, gait change or falls.  Pain now about 7/10, has not tried to take the robaxin she has at home.  Better wit ice and heat.  Worse to stand up and walking  . Pt denies polydipsia, polyuria.  Denies worsening depressive symptoms, suicidal ideation, or panic  Past Medical History:  Diagnosis Date  . Anxiety 06/02/2010  . BACK PAIN 07/26/2007  . 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 Cervicalgia 07/26/2007  . 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 DEPRESSIVE DISORDER 07/26/2007  . Grave's disease    hx of Grave's Disease/Hyperthyroidism  . Hx of migraines   . Hyperlipidemia 12/17/2011  . Insomnia 06/02/2010  . Lumbar degenerative disc disease 01/20/2011  . Lump or mass in breast 11/14/2008  . Palpitations 09/21/2008  . UTI 05/11/2007   Past Surgical History:  Procedure Laterality Date  . BREAST BIOPSY Right   . CESAREAN SECTION  10/29/2008   twins  . DILITATION & CURRETTAGE/HYSTROSCOPY WITH VERSAPOINT RESECTION N/A 07/20/2012   Procedure: DILATATION & CURETTAGE/HYSTEROSCOPY WITH VERSAPOINT RESECTION;  Surgeon: Princess Bruins, MD;  Location: [facility];  Service: Gynecology;  Laterality: N/A;  . HYSTEROSCOPY W/D&C  07-20-12    reports that she quit smoking about 22 years ago. She has never used smokeless tobacco. She reports that she does not drink alcohol or use drugs. family history includes Breast cancer (age of onset: 92) in her maternal aunt; Breast cancer (age of onset: 93) in her mother; Cancer in her brother and father; Hypertension in her mother. No Known Allergies Current Outpatient Medications on File Prior to Visit  Medication Sig Dispense Refill  . albuterol  (PROVENTIL HFA;VENTOLIN HFA) 108 (90 Base) MCG/ACT inhaler Inhale 2 puffs into the lungs every 6 (six) hours as needed for wheezing or shortness of breath. 1 Inhaler 5  . atorvastatin (LIPITOR) 20 MG tablet Take 1 tablet (20 mg total) by mouth daily. 90 tablet 3  . clonazePAM (KLONOPIN) 0.5 MG tablet TAKE (1) TABLET TWICE A DAY AS NEEDED. 60 tablet 2  . Fremanezumab-vfrm (AJOVY) 225 MG/1.5ML SOSY INJECT 3 SYRINGES ONCE MONTHLY EVERY 3 MONTHS    . methocarbamol (ROBAXIN) 500 MG tablet TAKE 1 TABLET EVERY 6 HOURS AS NEEDED FOR MUSCLE SPASM. 60 tablet 0  . rizatriptan (MAXALT) 5 MG tablet Take 1 tablet (5 mg total) by mouth as needed for migraine. May repeat in 2 hours if needed 10 tablet 0  . traMADol (ULTRAM) 50 MG tablet Take 1 tablet (50 mg total) by mouth every 6 (six) hours as needed. 60 tablet 1  . Vitamin D, Ergocalciferol, (DRISDOL) 50000 units CAPS capsule Take 1 capsule (50,000 Units total) by mouth every 7 (seven) days. 12 capsule 0  . ZOMIG 5 MG nasal solution   0   No current facility-administered medications on file prior to visit.    Review of Systems  Constitutional: Negative for other unusual diaphoresis or sweats HENT: Negative for ear discharge or swelling Eyes: Negative for other worsening visual disturbances Respiratory: Negative for stridor or other swelling  Gastrointestinal: Negative for worsening distension or other blood Genitourinary: Negative for retention or other urinary change Musculoskeletal: Negative for  other MSK pain or swelling Skin: Negative for color change or other new lesions Neurological: Negative for worsening tremors and other numbness  Psychiatric/Behavioral: Negative for worsening agitation or other fatigue All otherwise neg per pt     Objective:   Physical Exam BP 120/60 (BP Location: Left Arm, Patient Position: Sitting, Cuff Size: Normal)   Pulse 66   Temp 98.4 F (36.9 C) (Oral)   Ht 5' 5.75" (1.67 m)   Wt 164 lb (74.4 kg)   SpO2 96%    BMI 26.67 kg/m  VS noted,  Constitutional: Pt appears in NAD HENT: Head: NCAT.  Right Ear: External ear normal.  Left Ear: External ear normal.  Eyes: . Pupils are equal, round, and reactive to light. Conjunctivae and EOM are normal Nose: without d/c or deformity Neck: Neck supple. Gross normal ROM Cardiovascular: Normal rate and regular rhythm.   Pulmonary/Chest: Effort normal and breath sounds without rales or wheezing.  Spine nontender, has bilat lumbar paravertebral tender spasm Neurological: Pt is alert. At baseline orientation, motor grossly intact Skin: Skin is warm. No rashes, other new lesions, no LE edema Psychiatric: Pt behavior is normal without agitation  All otherwise neg per pt  Lab Results  Component Value Date   WBC 4.1 02/22/2018   HGB 14.2 02/22/2018   HCT 41.7 02/22/2018   PLT 280.0 02/22/2018   GLUCOSE 90 02/22/2018   CHOL 255 (H) 02/22/2018   TRIG 261.0 (H) 02/22/2018   HDL 37.30 (L) 02/22/2018   LDLDIRECT 186.0 02/22/2018   LDLCALC 158 (H) 01/31/2014   ALT 19 02/22/2018   AST 16 02/22/2018   NA 141 02/22/2018   K 4.3 02/22/2018   CL 105 02/22/2018   CREATININE 0.65 02/22/2018   BUN 13 02/22/2018   CO2 27 02/22/2018   TSH 0.74 02/22/2018   HGBA1C 5.7 02/22/2018        Assessment & Plan:

## 2019-02-24 ENCOUNTER — Telehealth: Payer: Self-pay | Admitting: Genetic Counselor

## 2019-02-24 NOTE — Telephone Encounter (Signed)
Cassandra Gallagher cld to schedule a genetic counseling appt on 1/20 at 9am to see [name] for a video visit. I verified that Cassandra Gallagher has an active Engineer, civil (consulting) as well as her email and mobile number.

## 2019-02-28 ENCOUNTER — Other Ambulatory Visit: Payer: Self-pay | Admitting: Internal Medicine

## 2019-03-01 ENCOUNTER — Inpatient Hospital Stay: Payer: BC Managed Care – PPO | Attending: Genetic Counselor

## 2019-03-01 ENCOUNTER — Other Ambulatory Visit: Payer: Self-pay

## 2019-03-01 ENCOUNTER — Other Ambulatory Visit: Payer: Self-pay | Admitting: Genetic Counselor

## 2019-03-01 ENCOUNTER — Encounter: Payer: Self-pay | Admitting: Genetic Counselor

## 2019-03-01 ENCOUNTER — Ambulatory Visit (HOSPITAL_BASED_OUTPATIENT_CLINIC_OR_DEPARTMENT_OTHER): Payer: BC Managed Care – PPO | Admitting: Genetic Counselor

## 2019-03-01 DIAGNOSIS — Z803 Family history of malignant neoplasm of breast: Secondary | ICD-10-CM

## 2019-03-01 LAB — GENETIC SCREENING ORDER

## 2019-03-01 NOTE — Progress Notes (Signed)
REFERRING PROVIDER: Rolm Bookbinder, MD [place],  [place] [address]  PRIMARY PROVIDER:  Biagio Borg, MD  PRIMARY REASON FOR VISIT:  1. Family history of breast cancer      HISTORY OF PRESENT ILLNESS:   Cassandra Gallagher, a 59 y.o. female, was seen for a [facility] cancer genetics consultation at the request of Dr. Donne Hazel due to a family history of breast cancer.  Ms. Tremain presents to clinic today to discuss the possibility of a hereditary predisposition to cancer, genetic testing, and to further clarify her future cancer risks, as well as potential cancer risks for family members.   Ms. Bays is a 59 y.o. female with no personal history of cancer.  She has had two breast biopsies, one in [place], [place] and one in [place] that were both benign.    CANCER HISTORY:  Oncology History   No history exists.     RISK FACTORS:  Menarche was at age 73-12.  First live birth at age 17.  Ovaries intact: yes.  Hysterectomy: no.  Menopausal status: postmenopausal.  HRT use: 1 years. Colonoscopy: yes; between 3-5 polyps. Mammogram within the last year: yes. Number of breast biopsies: 2. Up to date with pelvic exams: yes. Any excessive radiation exposure in the past: no  Past Medical History:  Diagnosis Date  . Anxiety 06/02/2010  . BACK PAIN 07/26/2007  . 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 Cervicalgia 07/26/2007  . 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 DEPRESSIVE DISORDER 07/26/2007  . Family history of breast cancer   . Grave's disease    hx of Grave's Disease/Hyperthyroidism  . Hx of migraines   . Hyperlipidemia 12/17/2011  . Insomnia 06/02/2010  . Lumbar degenerative disc disease 01/20/2011  . Lump or mass in breast 11/14/2008  . Palpitations 09/21/2008  . UTI 05/11/2007    Past Surgical History:  Procedure Laterality Date  . BREAST BIOPSY Right   . CESAREAN SECTION  10/29/2008   twins  . DILITATION & CURRETTAGE/HYSTROSCOPY  WITH VERSAPOINT RESECTION N/A 07/20/2012   Procedure: DILATATION & CURETTAGE/HYSTEROSCOPY WITH VERSAPOINT RESECTION;  Surgeon: Princess Bruins, MD;  Location: [facility];  Service: Gynecology;  Laterality: N/A;  . HYSTEROSCOPY WITH D & C  07-20-12    Social History   Socioeconomic History  . Marital status: Married    Spouse name: Not on file  . Number of children: 2  . Years of education: Not on file  . Highest education level: Not on file  Occupational History  . Occupation: Engineer, building services: [employer]  Tobacco Use  . Smoking status: Former Smoker    Quit date: 11/24/1996    Years since quitting: 22.2  . Smokeless tobacco: Never Used  . Tobacco comment: smoked for 6 years, quit 15 years ago  Substance and Sexual Activity  . Alcohol use: No  . Drug use: No  . Sexual activity: Yes  Other Topics Concern  . Not on file  Social History Narrative   2nd child born recently in September 2010.   Married, one child from previous marriage lives in [place]   Social Determinants of Health   Financial Resource Strain:   . Difficulty of Paying Living Expenses: Not on file  Food Insecurity:   . Worried About Charity fundraiser in the Last Year: Not on file  . Ran Out of Food in the Last Year: Not on file  Transportation Needs:   .  Lack of Transportation (Medical): Not on file  . Lack of Transportation (Non-Medical): Not on file  Physical Activity:   . Days of Exercise per Week: Not on file  . Minutes of Exercise per Session: Not on file  Stress:   . Feeling of Stress : Not on file  Social Connections:   . Frequency of Communication with Friends and Family: Not on file  . Frequency of Social Gatherings with Friends and Family: Not on file  . Attends Religious Services: Not on file  . Active Member of Clubs or Organizations: Not on file  . Attends Archivist Meetings: Not on file  . Marital Status: Not on file     FAMILY HISTORY:  We obtained a  detailed, 4-generation family history.  Significant diagnoses are listed below: Family History  Problem Relation Age of Onset  . Hypertension Mother   . Breast cancer Mother 2  . Cancer Father        lung  . Cancer Brother        lung   . Breast cancer Maternal Aunt 58  . Breast cancer Paternal Grandmother   . Breast cancer Maternal Grandmother     The patient has three daughters, two are twins, who are all cancer free.  She has three brothers and a sister.  One brother recently died due to lung cancer.  Both parents are deceased.  The patient's mother had breast cancer in her late 90's and possibly again before she died at 46.  Her mother was raised in an orphanage, and therefore family history is spotty.  She had three brothers and two sisters.  One sister had breast cancer twice, once at 32 and a second cancer at 47.  The maternal grandparents are deceased.  The grandmother and her sister both had breast cancer at unknown ages.  The patient's father died at 26 from lung cancer.  He was a smoker.  He has one sister who was not a smoker and died of lung cancer.  The paternal grandparents are deceased.  The grandmother died of breast cancer.  Ms. Narducci is unaware of previous family history of genetic testing for hereditary cancer risks. Patient's maternal ancestors are of [place] descent, and paternal ancestors are of [place] descent. There is no reported Ashkenazi Jewish ancestry. There is no known consanguinity.    GENETIC COUNSELING ASSESSMENT: Ms. Hausler is a 59 y.o. female with a family history of breast cancer which is somewhat suggestive of a hereditary cancer syndrome and predisposition to cancer given the number of women in the maternal family with breast cancer. We, therefore, discussed and recommended the following at today's visit.   DISCUSSION: We discussed that 5 - 10% of breast cancer is hereditary, with most cases associated with BRCA mutations.  There are other genes that  can be associated with hereditary breast cancer syndromes.  These include ATM, CHEK2 and PALB2.  Based on the age of onset of breast caner in her family, it is most suggestive that the cancer is familial and not hereditary.  However, testing is still appropriate as there may be family members not known about due to the maternal family being raised in an orphanage on the maternal side.  We discussed that testing is beneficial for several reasons including knowing how to follow individuals after completing their treatment and understand if other family members could be at risk for cancer and allow them to undergo genetic testing.   We reviewed the characteristics, features  and inheritance patterns of hereditary cancer syndromes. We also discussed genetic testing, including the appropriate family members to test, the process of testing, insurance coverage and turn-around-time for results. We discussed the implications of a negative, positive, carrier and/or variant of uncertain significant result. We recommended Ms. Hagadorn pursue genetic testing for the common hereditary cancer gene panel plus an additional gene, EGFR, to test for lung cancer predisposition in her family.  The Common Hereditary Gene Panel offered by [facility] includes sequencing and/or deletion duplication testing of the following 48 genes: APC, ATM, AXIN2, BARD1, BMPR1A, BRCA1, BRCA2, BRIP1, CDH1, CDK4, CDKN2A (p14ARF), CDKN2A (p16INK4a), CHEK2, CTNNA1, DICER1, EPCAM (Deletion/duplication testing only), GREM1 (promoter region deletion/duplication testing only), KIT, MEN1, MLH1, MSH2, MSH3, MSH6, MUTYH, NBN, NF1, NHTL1, PALB2, PDGFRA, PMS2, POLD1, POLE, PTEN, RAD50, RAD51C, RAD51D, RNF43, SDHB, SDHC, SDHD, SMAD4, SMARCA4. STK11, TP53, TSC1, TSC2, and VHL.  The following genes were evaluated for sequence changes only: SDHA and HOXB13 c.251G>A variant only.   Based on Ms. Whitner's family history of cancer, she meets medical criteria for genetic testing.  Despite that she meets criteria, she may still have an out of pocket cost. We discussed that if her out of pocket cost for testing is over $100, the laboratory will call and confirm whether she wants to proceed with testing.  If the out of pocket cost of testing is less than $100 she will be billed by the genetic testing laboratory.   Based on the patient's family history, a statistical model ([place] and [place]) was used to estimate her risk of developing breast cancer. This estimates her lifetime risk of developing breast cancer to be approximately 14.9% - 21.2%. This estimation does not consider any genetic testing results.  The patient's lifetime breast cancer risk is a preliminary estimate based on available information using one of several models endorsed by the [place] (ACS). The ACS recommends consideration of breast MRI screening as an adjunct to mammography for patients at high risk (defined as 20% or greater lifetime risk).   Ms. Nicolson has been determined to be at high risk for breast cancer.  Therefore, we recommend that annual screening with mammography and breast MRI be performed. We discussed that Ms. Luellen should discuss her individual situation with her referring physician and determine a breast cancer screening plan with which they are both comfortable.     PLAN: After considering the risks, benefits, and limitations, Ms. Mikolajczak provided informed consent to pursue genetic testing and the blood sample was sent to [facility] for analysis of the common hereditary cancer panel and EGFR gene. Results should be available within approximately 2-3 weeks' time, at which point they will be disclosed by telephone to Ms. Brigandi, as will any additional recommendations warranted by these results. Ms. Dafoe will receive a summary of her genetic counseling visit and a copy of her results once available. This information will also be available in Epic.   Lastly, we  encouraged Ms. Hargreaves to remain in contact with cancer genetics annually so that we can continuously update the family history and inform her of any changes in cancer genetics and testing that may be of benefit for this family.   Ms. Wallis questions were answered to her satisfaction today. Our contact information was provided should additional questions or concerns arise. Thank you for the referral and allowing [place] to share in the care of your patient.   Sonya Pucci P. Florene Glen, [place], [facility] Licensed, Insurance risk surveyor [email or web address] phone: [phone]  The patient was seen for a total of 50 minutes in face-to-face genetic counseling.  This patient was discussed with Drs. Magrinat, Lindi Adie and/or Burr Medico who agrees with the above.    _______________________________________________________________________ For Office Staff:  Number of people involved in session: 2 Was an Intern/ student involved with case: no

## 2019-03-16 ENCOUNTER — Telehealth: Payer: Self-pay | Admitting: Genetic Counselor

## 2019-03-16 ENCOUNTER — Ambulatory Visit: Payer: Self-pay | Admitting: Genetic Counselor

## 2019-03-16 ENCOUNTER — Encounter: Payer: Self-pay | Admitting: Genetic Counselor

## 2019-03-16 DIAGNOSIS — Z1379 Encounter for other screening for genetic and chromosomal anomalies: Secondary | ICD-10-CM

## 2019-03-16 NOTE — Progress Notes (Signed)
HPI:  Ms. Neglia was previously seen in the [facility] clinic due to a family history of breast and lung cancer and concerns regarding a hereditary predisposition to cancer. Please refer to our prior cancer genetics clinic note for more information regarding our discussion, assessment and recommendations, at the time. Ms. Poliquin recent genetic test results were disclosed to her, as were recommendations warranted by these results. These results and recommendations are discussed in more detail below.  CANCER HISTORY:  Oncology History   No history exists.    FAMILY HISTORY:  We obtained a detailed, 4-generation family history.  Significant diagnoses are listed below: Family History  Problem Relation Age of Onset  . Hypertension Mother   . Breast cancer Mother 73  . Cancer Father        lung  . Cancer Brother        lung   . Breast cancer Maternal Aunt 58  . Breast cancer Paternal Grandmother   . Breast cancer Maternal Grandmother     The patient has three daughters, two are twins, who are all cancer free.  She has three brothers and a sister.  One brother recently died due to lung cancer.  Both parents are deceased.  The patient's mother had breast cancer in her late 68's and possibly again before she died at 85.  Her mother was raised in an orphanage, and therefore family history is spotty.  She had three brothers and two sisters.  One sister had breast cancer twice, once at 9 and a second cancer at 75.  The maternal grandparents are deceased.  The grandmother and her sister both had breast cancer at unknown ages.  The patient's father died at 56 from lung cancer.  He was a smoker.  He has one sister who was not a smoker and died of lung cancer.  The paternal grandparents are deceased.  The grandmother died of breast cancer.  Ms. Inghram is unaware of previous family history of genetic testing for hereditary cancer risks. Patient's maternal ancestors are of [place]  descent, and paternal ancestors are of [place] descent. There is no reported Ashkenazi Jewish ancestry. There is no known consanguinity.    GENETIC TEST RESULTS: Genetic testing reported out on March 15, 2019 through the Common hereditary cancer panel plus an additional gene EGFR found no pathogenic mutations. The Common Hereditary Gene Panel offered by [facility] includes sequencing and/or deletion duplication testing of the following 49 genes: APC, ATM, AXIN2, BARD1, BMPR1A, BRCA1, BRCA2, BRIP1, CDH1, CDK4, CDKN2A (p14ARF), CDKN2A (p16INK4a), CHEK2, CTNNA1, DICER1, EGFR,  EPCAM (Deletion/duplication testing only), GREM1 (promoter region deletion/duplication testing only), KIT, MEN1, MLH1, MSH2, MSH3, MSH6, MUTYH, NBN, NF1, NHTL1, PALB2, PDGFRA, PMS2, POLD1, POLE, PTEN, RAD50, RAD51C, RAD51D, RNF43, SDHB, SDHC, SDHD, SMAD4, SMARCA4. STK11, TP53, TSC1, TSC2, and VHL.  The following genes were evaluated for sequence changes only: SDHA and HOXB13 c.251G>A variant only. . The test report has been scanned into EPIC and is located under the Molecular Pathology section of the Results Review tab.  A portion of the result report is included below for reference.     We discussed with Ms. Lahm that because current genetic testing is not perfect, it is possible there may be a gene mutation in one of these genes that current testing cannot detect, but that chance is small.  We also discussed, that there could be another gene that has not yet been discovered, or that we have not yet tested, that is responsible  for the cancer diagnoses in the family. It is also possible there is a hereditary cause for the cancer in the family that Ms. Dowd did not inherit and therefore was not identified in her testing.  Therefore, it is important to remain in touch with cancer genetics in the future so that we can continue to offer Ms. Goonan the most up to date genetic testing.   ADDITIONAL GENETIC TESTING: We discussed with Ms.  Stopper that there are other genes that are associated with increased cancer risk that can be analyzed. Should Ms. Batty wish to pursue additional genetic testing, we are happy to discuss and coordinate this testing, at any time.    CANCER SCREENING RECOMMENDATIONS: Ms. Schirm test result is considered negative (normal).  This means that we have not identified a hereditary cause for her family history of breast and lung cancer at this time. Most cancers happen by chance and this negative test suggests that her cancer may fall into this category.    While reassuring, this does not definitively rule out a hereditary predisposition to cancer. It is still possible that there could be genetic mutations that are undetectable by current technology. There could be genetic mutations in genes that have not been tested or identified to increase cancer risk.  Therefore, it is recommended she continue to follow the cancer management and screening guidelines provided by her primary healthcare provider.   An individual's cancer risk and medical management are not determined by genetic test results alone. Overall cancer risk assessment incorporates additional factors, including personal medical history, family history, and any available genetic information that may result in a personalized plan for cancer prevention and surveillance   RECOMMENDATIONS FOR FAMILY MEMBERS:  Individuals in this family might be at some increased risk of developing cancer, over the general population risk, simply due to the family history of cancer.  We recommended women in this family have a yearly mammogram beginning at age 5, or 21 years younger than the earliest onset of cancer, an annual clinical breast exam, and perform monthly breast self-exams. Women in this family should also have a gynecological exam as recommended by their primary provider. All family members should have a colonoscopy by age 64.  FOLLOW-UP: Lastly, we discussed with  Ms. Schlicker that cancer genetics is a rapidly advancing field and it is possible that new genetic tests will be appropriate for her and/or her family members in the future. We encouraged her to remain in contact with cancer genetics on an annual basis so we can update her personal and family histories and let her know of advances in cancer genetics that may benefit this family.   Our contact number was provided. Ms. Mable questions were answered to her satisfaction, and she knows she is welcome to call us at anytime with additional questions or concerns.   [name], [place], [facility] Licensed, Certified Genetic Counselor [email or web address]

## 2019-03-16 NOTE — Telephone Encounter (Signed)
Revealed negative genetic testing.  Discussed that we do not know why there is cancer in the family. It could be due to a different gene that we are not testing, or maybe our current technology may not be able to pick something up.  It will be important for her to keep in contact with genetics to keep up with whether additional testing may be needed.  

## 2019-04-03 ENCOUNTER — Other Ambulatory Visit: Payer: Self-pay | Admitting: Internal Medicine

## 2019-04-03 DIAGNOSIS — N6019 Diffuse cystic mastopathy of unspecified breast: Secondary | ICD-10-CM

## 2019-04-05 ENCOUNTER — Other Ambulatory Visit: Payer: Self-pay | Admitting: Internal Medicine

## 2019-04-05 NOTE — Telephone Encounter (Signed)
Done erx 

## 2019-04-17 ENCOUNTER — Ambulatory Visit: Payer: BC Managed Care – PPO | Attending: Internal Medicine

## 2019-04-17 DIAGNOSIS — Z23 Encounter for immunization: Secondary | ICD-10-CM | POA: Insufficient documentation

## 2019-04-17 NOTE — Progress Notes (Signed)
   Covid-19 Vaccination Clinic  Name:  Cassandra Gallagher    MRN: [id number] DOB: [birth year]  04/17/2019  [name] was observed post Covid-19 immunization for 15 minutes without incident. She was provided with Vaccine Information Sheet and instruction to access the V-Safe system.   Ms. Staples was instructed to call 911 with any severe reactions post vaccine: Marland Kitchen Difficulty breathing  . Swelling of face and throat  . A fast heartbeat  . A bad rash all over body  . Dizziness and weakness   Immunizations Administered    Name Date Dose VIS Date Route   Pfizer COVID-19 Vaccine 04/17/2019  5:47 PM 0.3 mL 01/20/2019 Intramuscular   Manufacturer: [place]   Lot: [id number]   [place]: [id number]

## 2019-04-27 ENCOUNTER — Other Ambulatory Visit: Payer: Self-pay

## 2019-04-28 ENCOUNTER — Ambulatory Visit: Payer: BC Managed Care – PPO | Admitting: Obstetrics & Gynecology

## 2019-05-01 ENCOUNTER — Other Ambulatory Visit: Payer: BC Managed Care – PPO

## 2019-05-08 ENCOUNTER — Ambulatory Visit: Payer: BC Managed Care – PPO | Attending: Internal Medicine

## 2019-05-08 DIAGNOSIS — Z23 Encounter for immunization: Secondary | ICD-10-CM

## 2019-05-08 NOTE — Progress Notes (Signed)
   Covid-19 Vaccination Clinic  Name:  Ritu Padberg    MRN: [id number] DOB: 24-Oct-1960  05/08/2019  Ms. Beardmore was observed post Covid-19 immunization for 15 minutes without incident. She was provided with Vaccine Information Sheet and instruction to access the V-Safe system.   Ms. Spinola was instructed to call 911 with any severe reactions post vaccine: Marland Kitchen Difficulty breathing  . Swelling of face and throat  . A fast heartbeat  . A bad rash all over body  . Dizziness and weakness   Immunizations Administered    Name Date Dose VIS Date Route   Pfizer COVID-19 Vaccine 05/08/2019  9:19 AM 0.3 mL 01/20/2019 Intramuscular   Manufacturer: [place]   Lot: [id number]   [place]: [id number]

## 2019-05-09 ENCOUNTER — Ambulatory Visit: Payer: BC Managed Care – PPO | Admitting: Obstetrics & Gynecology

## 2019-05-09 DIAGNOSIS — Z0289 Encounter for other administrative examinations: Secondary | ICD-10-CM

## 2019-05-19 ENCOUNTER — Ambulatory Visit
Admission: RE | Admit: 2019-05-19 | Discharge: 2019-05-19 | Disposition: A | Discharge status: Home / Self Care | Payer: BC Managed Care – PPO | Source: Ambulatory Visit | Attending: Internal Medicine | Admitting: Internal Medicine

## 2019-05-19 ENCOUNTER — Encounter: Payer: Self-pay | Admitting: Internal Medicine

## 2019-05-19 DIAGNOSIS — N6019 Diffuse cystic mastopathy of unspecified breast: Secondary | ICD-10-CM

## 2019-05-19 MED ORDER — GADOBUTROL 1 MMOL/ML IV SOLN
8.0000 mL | Freq: Once | INTRAVENOUS | Status: AC | PRN
  Administered 2019-05-19: 8 mL via INTRAVENOUS

## 2019-06-28 ENCOUNTER — Encounter: Payer: Self-pay | Admitting: Internal Medicine

## 2019-06-29 ENCOUNTER — Other Ambulatory Visit: Payer: Self-pay | Admitting: Internal Medicine

## 2019-06-29 NOTE — Telephone Encounter (Signed)
Done erx 

## 2019-06-30 ENCOUNTER — Other Ambulatory Visit: Payer: Self-pay

## 2019-06-30 ENCOUNTER — Encounter: Payer: Self-pay | Admitting: Internal Medicine

## 2019-06-30 ENCOUNTER — Ambulatory Visit (INDEPENDENT_AMBULATORY_CARE_PROVIDER_SITE_OTHER): Payer: BC Managed Care – PPO | Admitting: Internal Medicine

## 2019-06-30 VITALS — BP 120/78 | HR 62 | Temp 98.5°F | Ht 67.5 in | Wt 162.0 lb

## 2019-06-30 DIAGNOSIS — F329 Major depressive disorder, single episode, unspecified: Secondary | ICD-10-CM | POA: Diagnosis not present

## 2019-06-30 DIAGNOSIS — R739 Hyperglycemia, unspecified: Secondary | ICD-10-CM

## 2019-06-30 DIAGNOSIS — F32A Depression, unspecified: Secondary | ICD-10-CM

## 2019-06-30 DIAGNOSIS — IMO0002 Reserved for concepts with insufficient information to code with codable children: Secondary | ICD-10-CM

## 2019-06-30 DIAGNOSIS — Z0001 Encounter for general adult medical examination with abnormal findings: Secondary | ICD-10-CM

## 2019-06-30 DIAGNOSIS — E559 Vitamin D deficiency, unspecified: Secondary | ICD-10-CM | POA: Diagnosis not present

## 2019-06-30 DIAGNOSIS — G43709 Chronic migraine without aura, not intractable, without status migrainosus: Secondary | ICD-10-CM

## 2019-06-30 DIAGNOSIS — E538 Deficiency of other specified B group vitamins: Secondary | ICD-10-CM

## 2019-06-30 DIAGNOSIS — R1032 Left lower quadrant pain: Secondary | ICD-10-CM | POA: Diagnosis not present

## 2019-06-30 DIAGNOSIS — E785 Hyperlipidemia, unspecified: Secondary | ICD-10-CM

## 2019-06-30 LAB — HEPATIC FUNCTION PANEL
ALT: 21 U/L (ref 0–35)
AST: 17 U/L (ref 0–37)
Albumin: 4.4 g/dL (ref 3.5–5.2)
Alkaline Phosphatase: 66 U/L (ref 39–117)
Bilirubin, Direct: 0 mg/dL (ref 0.0–0.3)
Total Bilirubin: 0.3 mg/dL (ref 0.2–1.2)
Total Protein: 7.1 g/dL (ref 6.0–8.3)

## 2019-06-30 LAB — LIPID PANEL
Cholesterol: 202 mg/dL — ABNORMAL HIGH (ref 0–200)
HDL: 40.5 mg/dL (ref >39.00)
LDL Cholesterol: 136 mg/dL — ABNORMAL HIGH (ref 0–99)
NonHDL: 161.68
Total CHOL/HDL Ratio: 5
Triglycerides: 129 mg/dL (ref 0.0–149.0)
VLDL: 25.8 mg/dL (ref 0.0–40.0)

## 2019-06-30 LAB — CBC WITH DIFFERENTIAL/PLATELET
Basophils Absolute: 0 10*3/uL (ref 0.0–0.1)
Basophils Relative: 0.8 % (ref 0.0–3.0)
Eosinophils Absolute: 0.3 10*3/uL (ref 0.0–0.7)
Eosinophils Relative: 5.5 % — ABNORMAL HIGH (ref 0.0–5.0)
HCT: 39.6 % (ref 36.0–46.0)
Hemoglobin: 13.3 g/dL (ref 12.0–15.0)
Lymphocytes Relative: 21.3 % (ref 12.0–46.0)
Lymphs Abs: 1.2 10*3/uL (ref 0.7–4.0)
MCHC: 33.5 g/dL (ref 30.0–36.0)
MCV: 93.7 fl (ref 78.0–100.0)
Monocytes Absolute: 0.5 10*3/uL (ref 0.1–1.0)
Monocytes Relative: 8.3 % (ref 3.0–12.0)
Neutro Abs: 3.6 10*3/uL (ref 1.4–7.7)
Neutrophils Relative %: 64.1 % (ref 43.0–77.0)
Platelets: 259 10*3/uL (ref 150.0–400.0)
RBC: 4.23 Mil/uL (ref 3.87–5.11)
RDW: 12.7 % (ref 11.5–15.5)
WBC: 5.7 10*3/uL (ref 4.0–10.5)

## 2019-06-30 LAB — URINALYSIS, ROUTINE W REFLEX MICROSCOPIC
Bilirubin Urine: NEGATIVE
Hgb urine dipstick: NEGATIVE
Ketones, ur: NEGATIVE
Leukocytes,Ua: NEGATIVE
Nitrite: NEGATIVE
Specific Gravity, Urine: 1.015 (ref 1.000–1.030)
Total Protein, Urine: NEGATIVE
Urine Glucose: NEGATIVE
Urobilinogen, UA: 0.2 (ref 0.0–1.0)
pH: 5.5 (ref 5.0–8.0)

## 2019-06-30 LAB — VITAMIN B12: Vitamin B-12: 265 pg/mL (ref 211–911)

## 2019-06-30 LAB — BASIC METABOLIC PANEL
BUN: 14 mg/dL (ref 6–23)
CO2: 30 mEq/L (ref 19–32)
Calcium: 9.9 mg/dL (ref 8.4–10.5)
Chloride: 106 mEq/L (ref 96–112)
Creatinine, Ser: 0.63 mg/dL (ref 0.40–1.20)
GFR: 96.3 mL/min (ref >60.00)
Glucose, Bld: 105 mg/dL — ABNORMAL HIGH (ref 70–99)
Potassium: 3.7 mEq/L (ref 3.5–5.1)
Sodium: 139 mEq/L (ref 135–145)

## 2019-06-30 LAB — HEMOGLOBIN A1C: Hgb A1c MFr Bld: 5.7 % (ref 4.6–6.5)

## 2019-06-30 LAB — TSH: TSH: 0.73 u[IU]/mL (ref 0.35–4.50)

## 2019-06-30 LAB — LIPASE: Lipase: 12 U/L (ref 11.0–59.0)

## 2019-06-30 LAB — VITAMIN D 25 HYDROXY (VIT D DEFICIENCY, FRACTURES): VITD: 35.05 ng/mL (ref 30.00–100.00)

## 2019-06-30 NOTE — Patient Instructions (Signed)
Please continue all other medications as before, and refills have been done if requested.  Please have the pharmacy call with any other refills you may need.  Please continue your efforts at being more active, low cholesterol diet, and weight control.  You are otherwise up to date with prevention measures today.  Please keep your appointments with your specialists as you may have planned  You will be contacted regarding the referral for: cardiac CT, and abd /pelvis ct scan  Please go to the LAB at the blood drawing area for the tests to be done  You will be contacted by phone if any changes need to be made immediately.  Otherwise, you will receive a letter about your results with an explanation, but please check with MyChart first.  Please remember to sign up for MyChart if you have not done so, as this will be important to you in the future with finding out test results, communicating by private email, and scheduling acute appointments online when needed.  Please make an Appointment to return in 6 months, or sooner if needed

## 2019-06-30 NOTE — Progress Notes (Signed)
Subjective:    Patient ID: Cassandra Gallagher, female    DOB: 14-Jan-1961, 59 y.o.   MRN: [id number]  HPI  Here for wellness and f/u;  Overall doing ok;  Pt denies Chest pain, worsening SOB, DOE, wheezing, orthopnea, PND, worsening LE edema, palpitations, dizziness or syncope.  Pt denies neurological change such as new headache, facial or extremity weakness.  Pt denies polydipsia, polyuria, or low sugar symptoms. Pt states overall good compliance with treatment and medications, good tolerability, and has been trying to follow appropriate diet.  Pt denies worsening depressive symptoms, suicidal ideation or panic. No fever, night sweats, wt loss, loss of appetite, or other constitutional symptoms.  Pt states good ability with ADL's, has low fall risk, home safety reviewed and adequate, no other significant changes in hearing or vision, and only occasionally active with exercise. Also c/o 6 mo intermittent but gradually more freq and severe LLQ pain, pressure like but also sharp and squeezing pinching at times but does not think constipation related; Denies urinary symptoms such as dysuria, frequency, urgency, flank pain, hematuria or n/v, fever, chills.  Denies worsening reflux, other abd pain, dysphagia, n/v, bowel change or blood.  Has ongoing severe elevated LDL Cholesterol, and is interested in cardiac ct scoring. Past Medical History:  Diagnosis Date  . Anxiety 06/02/2010  . BACK PAIN 07/26/2007  . 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 Cervicalgia 07/26/2007  . 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 DEPRESSIVE DISORDER 07/26/2007  . Family history of breast cancer   . Grave's disease    hx of Grave's Disease/Hyperthyroidism  . Hx of migraines   . Hyperlipidemia 12/17/2011  . Insomnia 06/02/2010  . Lumbar degenerative disc disease 01/20/2011  . Lump or mass in breast 11/14/2008  . Palpitations 09/21/2008  . UTI 05/11/2007   Past Surgical History:  Procedure Laterality Date  .  BREAST BIOPSY Right   . CESAREAN SECTION  10/29/2008   twins  . DILITATION & CURRETTAGE/HYSTROSCOPY WITH VERSAPOINT RESECTION N/A 07/20/2012   Procedure: DILATATION & CURETTAGE/HYSTEROSCOPY WITH VERSAPOINT RESECTION;  Surgeon: Princess Bruins, MD;  Location: [facility];  Service: Gynecology;  Laterality: N/A;  . HYSTEROSCOPY WITH D & C  07-20-12    reports that she quit smoking about 22 years ago. She has never used smokeless tobacco. She reports that she does not drink alcohol or use drugs. family history includes Breast cancer in her maternal grandmother and paternal grandmother; Breast cancer (age of onset: 5) in her maternal aunt; Breast cancer (age of onset: 12) in her mother; Cancer in her brother and father; Hypertension in her mother. No Known Allergies Current Outpatient Medications on File Prior to Visit  Medication Sig Dispense Refill  . clonazePAM (KLONOPIN) 0.5 MG tablet TAKE (1) TABLET TWICE A DAY AS NEEDED. 60 tablet 2  . Fremanezumab-vfrm (AJOVY) 225 MG/1.5ML SOSY INJECT 3 SYRINGES ONCE MONTHLY EVERY 3 MONTHS    . HYDROcodone-acetaminophen (NORCO/VICODIN) 5-325 MG tablet Take 1 tablet by mouth every 6 (six) hours as needed. 10 tablet 0  . ZOMIG 5 MG nasal solution   0  . atorvastatin (LIPITOR) 20 MG tablet Take 1 tablet (20 mg total) by mouth daily. 90 tablet 3   No current facility-administered medications on file prior to visit.   Review of Systems All otherwise neg per pt    Objective:   Physical Exam BP 120/78 (BP Location: Left Arm, Patient Position: Sitting, Cuff Size: Large)   Pulse 62   Temp 98.5 F (  36.9 C) (Oral)   Ht 5' 7.5" (1.715 m)   Wt 162 lb (73.5 kg)   SpO2 96%   BMI 25.00 kg/m  VS noted,  Constitutional: Pt appears in NAD HENT: Head: NCAT.  Right Ear: External ear normal.  Left Ear: External ear normal.  Eyes: . Pupils are equal, round, and reactive to light. Conjunctivae and EOM are normal Nose: without d/c or deformity Neck: Neck supple. Gross  normal ROM Cardiovascular: Normal rate and regular rhythm.   Pulmonary/Chest: Effort normal and breath sounds without rales or wheezing.  Abd:  Soft, ND, + BS, no organomegaly with mild LLQ tender, without guarding or rebound Neurological: Pt is alert. At baseline orientation, motor grossly intact Skin: Skin is warm. No rashes, other new lesions, no LE edema Psychiatric: Pt behavior is normal without agitation  All otherwise neg per pt Lab Results  Component Value Date   WBC 5.7 06/30/2019   HGB 13.3 06/30/2019   HCT 39.6 06/30/2019   PLT 259.0 06/30/2019   GLUCOSE 105 (H) 06/30/2019   CHOL 202 (H) 06/30/2019   TRIG 129.0 06/30/2019   HDL 40.50 06/30/2019   LDLDIRECT 186.0 02/22/2018   LDLCALC 136 (H) 06/30/2019   ALT 21 06/30/2019   AST 17 06/30/2019   NA 139 06/30/2019   K 3.7 06/30/2019   CL 106 06/30/2019   CREATININE 0.63 06/30/2019   BUN 14 06/30/2019   CO2 30 06/30/2019   TSH 0.73 06/30/2019   HGBA1C 5.7 06/30/2019      Assessment & Plan:

## 2019-07-01 ENCOUNTER — Encounter: Payer: Self-pay | Admitting: Internal Medicine

## 2019-07-01 NOTE — Assessment & Plan Note (Signed)
Severe, pt declines statin for now, for ct cardiac score

## 2019-07-01 NOTE — Assessment & Plan Note (Addendum)
Etiology unclear, for ct abd/pelvis, consider gi referral  I spent 31 minutes in addition to time for CPX wellness examination in preparing to see the patient by review of recent labs, imaging and procedures, obtaining and reviewing separately obtained history, communicating with the patient and family or caregiver, ordering medications, tests or procedures, and documenting clinical information in the EHR including the differential Dx, treatment, and any further evaluation and other management of LLQ pain, hyperglycemia, hld, depression, migraine

## 2019-07-01 NOTE — Assessment & Plan Note (Signed)
stable overall by history and exam, recent data reviewed with pt, and pt to continue medical treatment as before,  to f/u any worsening symptoms or concerns  

## 2019-07-01 NOTE — Assessment & Plan Note (Signed)

## 2019-07-01 NOTE — Assessment & Plan Note (Signed)
Much improved over 50% on current med tx though expensive

## 2019-07-02 ENCOUNTER — Other Ambulatory Visit: Payer: Self-pay | Admitting: Internal Medicine

## 2019-07-03 ENCOUNTER — Other Ambulatory Visit: Payer: Self-pay | Admitting: Internal Medicine

## 2019-07-03 NOTE — Telephone Encounter (Signed)
Pt has been scheduled for 5/24 at [facility]

## 2019-07-05 ENCOUNTER — Encounter: Payer: Self-pay | Admitting: Internal Medicine

## 2019-07-07 ENCOUNTER — Encounter: Payer: Self-pay | Admitting: Internal Medicine

## 2019-07-21 ENCOUNTER — Other Ambulatory Visit: Payer: Self-pay

## 2019-07-21 ENCOUNTER — Encounter: Payer: Self-pay | Admitting: Internal Medicine

## 2019-07-21 ENCOUNTER — Ambulatory Visit (INDEPENDENT_AMBULATORY_CARE_PROVIDER_SITE_OTHER): Payer: BC Managed Care – PPO | Admitting: Internal Medicine

## 2019-07-21 VITALS — BP 110/78 | HR 67 | Temp 97.5°F | Ht 67.5 in | Wt 164.0 lb

## 2019-07-21 DIAGNOSIS — E785 Hyperlipidemia, unspecified: Secondary | ICD-10-CM | POA: Diagnosis not present

## 2019-07-21 DIAGNOSIS — M545 Low back pain, unspecified: Secondary | ICD-10-CM

## 2019-07-21 DIAGNOSIS — R1032 Left lower quadrant pain: Secondary | ICD-10-CM

## 2019-07-21 DIAGNOSIS — R739 Hyperglycemia, unspecified: Secondary | ICD-10-CM

## 2019-07-21 MED ORDER — ATORVASTATIN CALCIUM 20 MG PO TABS: 20.0000 mg | ORAL_TABLET | Freq: Every day | ORAL | 3 refills | Status: DC

## 2019-07-21 MED ORDER — DICYCLOMINE HCL 10 MG PO CAPS: 10.0000 mg | ORAL_CAPSULE | Freq: Three times a day (TID) | ORAL | 5 refills | Status: DC

## 2019-07-21 NOTE — Progress Notes (Signed)
Subjective:    Patient ID: Cassandra Gallagher, female    DOB: [birth year], 59 y.o.   MRN: [id number]  HPI   Here to f/u; overall doing ok,  Pt denies chest pain, increasing sob or doe, wheezing, orthopnea, PND, increased LE swelling, palpitations, dizziness or syncope.  Pt denies new neurological symptoms such as new headache, or facial or extremity weakness or numbness.  Pt denies polydipsia, polyuria, or low sugar episode.  Pt states overall good compliance with meds, mostly trying to follow appropriate diet, with wt overall stable,  but little exercise however.  Denies worsening reflux, abd pain, dysphagia, n/v, bowel change or blood, except for chornic llq pain. Pt continues to have recurring LBP without change in severity, bowel or bladder change, fever, wt loss,  worsening LE pain/numbness/weakness, gait change or falls.   Past Medical History:  Diagnosis Date  . Anxiety 06/02/2010  . BACK PAIN 07/26/2007  . 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 Cervicalgia 07/26/2007  . 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 DEPRESSIVE DISORDER 07/26/2007  . Family history of breast cancer   . Grave's disease    hx of Grave's Disease/Hyperthyroidism  . Hx of migraines   . Hyperlipidemia 12/17/2011  . Insomnia 06/02/2010  . Lumbar degenerative disc disease 01/20/2011  . Lump or mass in breast 11/14/2008  . Palpitations 09/21/2008  . UTI 05/11/2007   Past Surgical History:  Procedure Laterality Date  . BREAST BIOPSY Right   . CESAREAN SECTION  10/29/2008   twins  . DILITATION & CURRETTAGE/HYSTROSCOPY WITH VERSAPOINT RESECTION N/A 07/20/2012   Procedure: DILATATION & CURETTAGE/HYSTEROSCOPY WITH VERSAPOINT RESECTION;  Surgeon: Princess Bruins, MD;  Location: [facility];  Service: Gynecology;  Laterality: N/A;  . HYSTEROSCOPY WITH D & C  07-20-12    reports that she quit smoking about 22 years ago. She has never used smokeless tobacco. She reports that she does not drink alcohol and does  not use drugs. family history includes Breast cancer in her maternal grandmother and paternal grandmother; Breast cancer (age of onset: 77) in her maternal aunt; Breast cancer (age of onset: 75) in her mother; Cancer in her brother and father; Hypertension in her mother. No Known Allergies Current Outpatient Medications on File Prior to Visit  Medication Sig Dispense Refill  . clonazePAM (KLONOPIN) 0.5 MG tablet TAKE (1) TABLET TWICE A DAY AS NEEDED. 60 tablet 2  . Fremanezumab-vfrm (AJOVY) 225 MG/1.5ML SOSY INJECT 3 SYRINGES ONCE MONTHLY EVERY 3 MONTHS    . HYDROcodone-acetaminophen (NORCO/VICODIN) 5-325 MG tablet Take 1 tablet by mouth every 6 (six) hours as needed. 10 tablet 0  . ZOMIG 5 MG nasal solution   0  . ZOMIG 2.5 MG SOLN INSTILL 1 SPRAY INTO ONE NOSTRIL FOR 1 DOSE FOR 30 DAYS     No current facility-administered medications on file prior to visit.   Review of Systems All otherwise neg per pt     Objective:   Physical Exam BP 110/78 (BP Location: Left Arm, Patient Position: Sitting, Cuff Size: Large)   Pulse 67   Temp (!) 97.5 F (36.4 C) (Oral)   Ht 5' 7.5" (1.715 m)   Wt 164 lb (74.4 kg)   SpO2 98%   BMI 25.31 kg/m  VS noted,  Constitutional: Pt appears in NAD HENT: Head: NCAT.  Right Ear: External ear normal.  Left Ear: External ear normal.  Eyes: . Pupils are equal, round, and reactive to light. Conjunctivae and EOM are normal Nose: without  d/c or deformity Neck: Neck supple. Gross normal ROM Cardiovascular: Normal rate and regular rhythm.   Pulmonary/Chest: Effort normal and breath sounds without rales or wheezing.  Abd:  Soft, NT, ND, + BS, no organomegaly Neurological: Pt is alert. At baseline orientation, motor grossly intact Skin: Skin is warm. No rashes, other new lesions, no LE edema Psychiatric: Pt behavior is normal without agitation  All otherwise neg per pt Lab Results  Component Value Date   WBC 5.7 06/30/2019   HGB 13.3 06/30/2019   HCT 39.6  06/30/2019   PLT 259.0 06/30/2019   GLUCOSE 105 (H) 06/30/2019   CHOL 202 (H) 06/30/2019   TRIG 129.0 06/30/2019   HDL 40.50 06/30/2019   LDLDIRECT 186.0 02/22/2018   LDLCALC 136 (H) 06/30/2019   ALT 21 06/30/2019   AST 17 06/30/2019   NA 139 06/30/2019   K 3.7 06/30/2019   CL 106 06/30/2019   CREATININE 0.63 06/30/2019   BUN 14 06/30/2019   CO2 30 06/30/2019   TSH 0.73 06/30/2019   HGBA1C 5.7 06/30/2019  \      Assessment & Plan:

## 2019-07-21 NOTE — Patient Instructions (Addendum)
Please take all new medication as prescribed - the generic bentyl when needed  Please continue all other medications as before, including to restart the lipitor  Please have the pharmacy call with any other refills you may need.  Please continue your efforts at being more active, low cholesterol diet, and weight control.  Please keep your appointments with your specialists as you may have planned  Please let me know if you want to be referred to nutrition when you return from [place]

## 2019-07-23 ENCOUNTER — Encounter: Payer: Self-pay | Admitting: Internal Medicine

## 2019-07-23 NOTE — Assessment & Plan Note (Addendum)
Ok to restart lipitor, low chol diet, refer nutrition  I spent 31 minutes in preparing to see the patient by review of recent labs, imaging and procedures, obtaining and reviewing separately obtained history, communicating with the patient and family or caregiver, ordering medications, tests or procedures, and documenting clinical information in the EHR including the differential Dx, treatment, and any further evaluation and other management of hld, chronic lbp, chronic llq pain, hyperglycemai

## 2019-07-23 NOTE — Assessment & Plan Note (Signed)
stable overall by history and exam, recent data reviewed with pt, and pt to continue medical treatment as before,  to f/u any worsening symptoms or concerns  

## 2019-07-24 ENCOUNTER — Encounter: Payer: Self-pay | Admitting: Internal Medicine

## 2019-10-11 ENCOUNTER — Other Ambulatory Visit: Payer: Self-pay | Admitting: Internal Medicine

## 2019-10-11 NOTE — Telephone Encounter (Signed)
Done erx 

## 2019-11-29 ENCOUNTER — Ambulatory Visit: Payer: Self-pay

## 2019-11-29 ENCOUNTER — Ambulatory Visit: Payer: BC Managed Care – PPO | Admitting: Family Medicine

## 2019-11-29 ENCOUNTER — Encounter: Payer: Self-pay | Admitting: Family Medicine

## 2019-11-29 ENCOUNTER — Other Ambulatory Visit: Payer: Self-pay

## 2019-11-29 VITALS — BP 102/70 | HR 77 | Ht 67.5 in | Wt 154.8 lb

## 2019-11-29 DIAGNOSIS — M79671 Pain in right foot: Secondary | ICD-10-CM | POA: Diagnosis not present

## 2019-11-29 DIAGNOSIS — M722 Plantar fascial fibromatosis: Secondary | ICD-10-CM

## 2019-11-29 DIAGNOSIS — G8929 Other chronic pain: Secondary | ICD-10-CM

## 2019-11-29 NOTE — Progress Notes (Signed)
I, Cassandra Gallagher, LAT, ATC, am serving as scribe for Dr. Lynne Leader.  Cassandra Gallagher is a 59 y.o. female who presents to [place] at [facility] today for f/u of R foot / great toe pain.  She was last seen by Dr. Georgina Snell on 12/30/18 for R medial great toe pain and numbness in her R lateral toes.  She was provided w/ scaphoid pads and advised to use Voltaren gel.  Since her last visit, pt reports that her R medial foot pain just distal to her 1st MTPJ has worsened.  She con't to have numbness in her R lateral 3 toes.  She reports a new "bump" along her R medial arch.  She states that the only shoes she can comfortably wear are regular Birknestocks [facility] model/style).  She has tried multiple different over-the-counter insoles or different shoes with little benefit.  She can walk 10 miles in her Birkenstocks but cannot walk more than about a mile in regular shoes without having pain in the plantar aspect of her foot and some numbness on the dorsal lateral foot and toes   Pertinent review of systems: No fevers or chills  Relevant historical information: Migraine history.  Hyperlipidemia.   Exam:  BP 102/70 (BP Location: Left Arm, Patient Position: Sitting, Cuff Size: Normal)   Pulse 77   Ht 5' 7.5" (1.715 m)   Wt 154 lb 12.8 oz (70.2 kg)   SpO2 96%   BMI 23.89 kg/m  General: Well Developed, well nourished, and in no acute distress.   MSK: Left foot bunion present.  High arch present.  Visible small nodule plantar midfoot to forefoot under first ray. Normal foot and ankle motion.  Normal first toe motion Pulses cap refill and sensation are intact distally. Palpable nodule plantar midfoot plantar to the first ray superficial.  Mildly tender palpation.    Lab and Radiology Results  Procedure: Real-time Ultrasound Guided Injection of right plantar foot nodule associated with plantar fascia Device: Philips Affiniti 50G  Images permanently stored and available for  review in PACS Ultrasound examination of nodule prior to injection reveals a small less than 1 cm nodule associated with the plantar fascia with some hypoechoic change not purely cystic.  Likely plantar fascia nodule Verbal informed consent obtained.  Discussed risks and benefits of procedure. Warned about infection bleeding damage to structures skin hypopigmentation and fat atrophy among others. Patient expresses understanding and agreement Time-out conducted.   Noted no overlying erythema, induration, or other signs of local infection.   Skin prepped in a sterile fashion.   Local anesthesia: Topical Ethyl chloride.   With sterile technique and under real time ultrasound guidance:  30 mg of Kenalog 0.75 mL of Marcaine injected into the plantar nodule. Fluid seen entering the nodule.   Completed without difficulty   Pain immediately resolved suggesting accurate placement of the medication.   Advised to call if fevers/chills, erythema, induration, drainage, or persistent bleeding.   Images permanently stored and available for review in the ultrasound unit.  Impression: Technically successful ultrasound guided injection.      Assessment and Plan: 59 y.o. female with right foot persistent pain.  Dominant finding on exam today is somewhat high arch, plantar fascial nodule/fibroma midfoot, and bunion. I think the main issue is she has to alter her gait because of the plantar nodule which is causing some of the numbness that she is experiencing and in discomfort. Plan for injection of the nodule today.  If  this does not provide significant relief would recommend referral to podiatry for custom orthotic evaluation as well as second opinion.  Patient will notify me if not doing well.   PDMP not reviewed this encounter. Orders Placed This Encounter  Procedures  . [place] LIMITED JOINT SPACE STRUCTURES LOW RIGHT(NO LINKED CHARGES)    Order Specific Question:   Reason for Exam (SYMPTOM  OR DIAGNOSIS  REQUIRED)    Answer:   R foot pain    Order Specific Question:   Preferred imaging location?    Answer:   [facility]   No orders of the defined types were placed in this encounter.    Discussed warning signs or symptoms. Please see discharge instructions. Patient expresses understanding.   The above documentation has been reviewed and is accurate and complete Lynne Leader, M.D.  Total encounter time 30 minutes including face-to-face time with the patient and, reviewing past medical record, and charting on the date of service.  Time excludes time to perform ultrasound and injection. Discussed diagnosis and treatment plan and options and next steps.

## 2019-11-29 NOTE — Patient Instructions (Signed)
Thank you for coming in today.  Call or go to the ER if you develop a large red swollen joint with extreme pain or oozing puss.   If not significantly better in a few weeks let me know.  Next steps are referral to podiatry to eval orthotics and possibly eventually surgery.

## 2019-12-29 ENCOUNTER — Encounter: Payer: Self-pay | Admitting: Internal Medicine

## 2019-12-30 ENCOUNTER — Encounter (HOSPITAL_COMMUNITY): Payer: Self-pay

## 2019-12-30 ENCOUNTER — Other Ambulatory Visit: Payer: Self-pay

## 2019-12-30 ENCOUNTER — Ambulatory Visit (HOSPITAL_COMMUNITY)
Admission: RE | Admit: 2019-12-30 | Discharge: 2019-12-30 | Disposition: A | Discharge status: Home / Self Care | Payer: BC Managed Care – PPO | Source: Ambulatory Visit | Attending: Urgent Care | Admitting: Urgent Care

## 2019-12-30 VITALS — BP 92/62 | HR 70 | Temp 98.4°F

## 2019-12-30 DIAGNOSIS — R0981 Nasal congestion: Secondary | ICD-10-CM | POA: Insufficient documentation

## 2019-12-30 DIAGNOSIS — Z87891 Personal history of nicotine dependence: Secondary | ICD-10-CM | POA: Diagnosis not present

## 2019-12-30 DIAGNOSIS — J029 Acute pharyngitis, unspecified: Secondary | ICD-10-CM | POA: Insufficient documentation

## 2019-12-30 DIAGNOSIS — Z79899 Other long term (current) drug therapy: Secondary | ICD-10-CM | POA: Insufficient documentation

## 2019-12-30 DIAGNOSIS — Z20822 Contact with and (suspected) exposure to covid-19: Secondary | ICD-10-CM | POA: Diagnosis not present

## 2019-12-30 DIAGNOSIS — R519 Headache, unspecified: Secondary | ICD-10-CM | POA: Diagnosis not present

## 2019-12-30 DIAGNOSIS — B349 Viral infection, unspecified: Secondary | ICD-10-CM | POA: Insufficient documentation

## 2019-12-30 DIAGNOSIS — R1310 Dysphagia, unspecified: Secondary | ICD-10-CM | POA: Diagnosis not present

## 2019-12-30 DIAGNOSIS — R059 Cough, unspecified: Secondary | ICD-10-CM | POA: Diagnosis not present

## 2019-12-30 LAB — RESPIRATORY PANEL BY RT PCR (FLU A&B, COVID)
Influenza A by PCR: NEGATIVE
Influenza B by PCR: NEGATIVE
SARS Coronavirus 2 by RT PCR: NEGATIVE

## 2019-12-30 LAB — POCT RAPID STREP A, ED / UC: Streptococcus, Group A Screen (Direct): NEGATIVE

## 2019-12-30 MED ORDER — NAPROXEN 500 MG PO TABS: 500.0000 mg | ORAL_TABLET | Freq: Two times a day (BID) | ORAL | 0 refills | Status: DC

## 2019-12-30 MED ORDER — PSEUDOEPHEDRINE HCL 30 MG PO TABS: 30.0000 mg | ORAL_TABLET | Freq: Three times a day (TID) | ORAL | 0 refills | Status: DC | PRN

## 2019-12-30 MED ORDER — CETIRIZINE HCL 10 MG PO TABS: 10.0000 mg | ORAL_TABLET | Freq: Every day | ORAL | 0 refills | Status: DC

## 2019-12-30 NOTE — ED Provider Notes (Signed)
[place]   MRN: [id number] DOB: Jun 25, 1960  Subjective:   Cassandra Gallagher is a 59 y.o. female presenting for 5-day history of acute onset runny stuffy nose, throat pain, cough, sinus headaches, painful swallowing.  Feels like she has swelling at the bottom of her neck/throat anteriorly.  Patient has been vaccinated including booster.  Has been using aspirin without any relief.  Patient has a history of Graves' disease but states that this feels very different.  Denies chest pain, shortness of breath, body aches.  No current facility-administered medications for this encounter.  Current Outpatient Medications:    aspirin 325 MG tablet, Take 325 mg by mouth daily., Disp: , Rfl:    atorvastatin (LIPITOR) 20 MG tablet, Take 1 tablet (20 mg total) by mouth daily., Disp: 90 tablet, Rfl: 3   clonazePAM (KLONOPIN) 0.5 MG tablet, TAKE (1) TABLET TWICE A DAY AS NEEDED., Disp: 60 tablet, Rfl: 2   dicyclomine (BENTYL) 10 MG capsule, Take 1 capsule (10 mg total) by mouth 4 (four) times daily -  before meals and at bedtime., Disp: 90 capsule, Rfl: 5   Fremanezumab-vfrm (AJOVY) 225 MG/1.5ML SOSY, INJECT 3 SYRINGES ONCE MONTHLY EVERY 3 MONTHS, Disp: , Rfl:    HYDROcodone-acetaminophen (NORCO/VICODIN) 5-325 MG tablet, Take 1 tablet by mouth every 6 (six) hours as needed., Disp: 10 tablet, Rfl: 0   ZOMIG 2.5 MG SOLN, INSTILL 1 SPRAY INTO ONE NOSTRIL FOR 1 DOSE FOR 30 DAYS, Disp: , Rfl:    ZOMIG 5 MG nasal solution, , Disp: , Rfl: 0   No Known Allergies  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   Cervicalgia 07/26/2007   DEGENERATIVE DISC DISEASE, CERVICAL SPINE 06/07/2009   DEPRESSIVE DISORDER 07/26/2007   Family history of breast cancer    Grave's disease    hx of Grave's Disease/Hyperthyroidism   Hx of migraines    Hyperlipidemia 12/17/2011   Insomnia 06/02/2010   Lumbar  degenerative disc disease 01/20/2011   Lump or mass in breast 11/14/2008   Palpitations 09/21/2008   UTI 05/11/2007     Past Surgical History:  Procedure Laterality Date   BREAST BIOPSY Right    CESAREAN SECTION  10/29/2008   twins   DILITATION & CURRETTAGE/HYSTROSCOPY WITH VERSAPOINT RESECTION N/A 07/20/2012   Procedure: DILATATION & CURETTAGE/HYSTEROSCOPY WITH VERSAPOINT RESECTION;  Surgeon: Princess Bruins, MD;  Location: [facility];  Service: Gynecology;  Laterality: N/A;   HYSTEROSCOPY WITH D & C  07-20-12    Family History  Problem Relation Age of Onset   Hypertension Mother    Breast cancer Mother 31   Cancer Father        lung   Cancer Brother        lung    Breast cancer Maternal Aunt 47   Breast cancer Paternal Grandmother    Breast cancer Maternal Grandmother     Social History   Tobacco Use   Smoking status: Former Smoker    Quit date: 11/24/1996    Years since quitting: 23.1   Smokeless tobacco: Never Used   Tobacco comment: smoked for 6 years, quit 15 years ago  Vaping Use   Vaping Use: Never used  Substance Use Topics   Alcohol use: No   Drug use: No    ROS   Objective:   Vitals: BP 92/62 (BP Location: Left Arm)    Pulse 70  Temp 98.4 F (36.9 C) (Oral)    SpO2 97%   Physical Exam Constitutional:      General: She is not in acute distress.    Appearance: Normal appearance. She is well-developed. She is not ill-appearing, toxic-appearing or diaphoretic.  HENT:     Head: Normocephalic and atraumatic.     Right Ear: Tympanic membrane and ear canal normal. No drainage or tenderness. No middle ear effusion. Tympanic membrane is not erythematous.     Left Ear: Tympanic membrane and ear canal normal. No drainage or tenderness.  No middle ear effusion. Tympanic membrane is not erythematous.     Nose: Congestion present. No rhinorrhea.     Mouth/Throat:     Mouth: Mucous membranes are moist. No oral lesions.     Pharynx: No  pharyngeal swelling, oropharyngeal exudate, posterior oropharyngeal erythema or uvula swelling.     Tonsils: No tonsillar exudate or tonsillar abscesses.  Eyes:     Extraocular Movements: Extraocular movements intact.     Right eye: Normal extraocular motion.     Left eye: Normal extraocular motion.     Conjunctiva/sclera: Conjunctivae normal.     Pupils: Pupils are equal, round, and reactive to light.  Neck:   Cardiovascular:     Rate and Rhythm: Normal rate and regular rhythm.     Pulses: Normal pulses.     Heart sounds: Normal heart sounds. No murmur heard.  No friction rub. No gallop.   Pulmonary:     Effort: Pulmonary effort is normal. No respiratory distress.     Breath sounds: Normal breath sounds. No stridor. No wheezing, rhonchi or rales.  Musculoskeletal:     Cervical back: Normal range of motion and neck supple.  Lymphadenopathy:     Cervical: No cervical adenopathy.     Right cervical: No superficial, deep or posterior cervical adenopathy.    Left cervical: No superficial, deep or posterior cervical adenopathy.  Skin:    General: Skin is warm and dry.     Findings: No rash.  Neurological:     General: No focal deficit present.     Mental Status: She is alert and oriented to person, place, and time.  Psychiatric:        Mood and Affect: Mood normal.        Behavior: Behavior normal.        Thought Content: Thought content normal.     Results for orders placed or performed during the hospital encounter of 12/30/19 (from the past 24 hour(s))  POCT Rapid Strep A (ED/UC)     Status: None   Collection Time: 12/30/19  2:36 PM  Result Value Ref Range   Streptococcus, Group A Screen (Direct) NEGATIVE NEGATIVE    Assessment and Plan :   PDMP not reviewed this encounter.  1. Viral syndrome   2. Sore throat   3. Sinus congestion   4. Cough   5. Dysphagia, unspecified type     Will manage for viral illness such as viral URI, viral syndrome, viral URI, viral  pharyngitis, COVID-19. Counseled patient on nature of COVID-19 including modes of transmission, diagnostic testing, management and supportive care.  Offered scripts for symptomatic relief. COVID 19, strep culture, flu testing is pending. Patient does not like to take NSAIDs, APAP; recommended continued use of aspirin. Counseled patient on potential for adverse effects with medications prescribed/recommended today, strict ER and return-to-clinic precautions discussed, patient verbalized understanding.    Jaynee Eagles, PA-C 12/30/19 [id number]

## 2019-12-30 NOTE — ED Triage Notes (Signed)
Pt presents with sore throat, nasal congestion and headache x 5 days, Pain worse when swallows. Pt denies sob, cough, fever. Aspirin gives some relief, last dose 10 am today.

## 2019-12-30 NOTE — Discharge Instructions (Addendum)

## 2020-01-01 ENCOUNTER — Other Ambulatory Visit: Payer: Self-pay | Admitting: Internal Medicine

## 2020-01-02 LAB — CULTURE, GROUP A STREP (THRC)

## 2020-01-03 ENCOUNTER — Other Ambulatory Visit: Payer: Self-pay

## 2020-01-03 ENCOUNTER — Telehealth: Payer: Self-pay | Admitting: Internal Medicine

## 2020-01-03 ENCOUNTER — Emergency Department (HOSPITAL_COMMUNITY)
Admission: EM | Admit: 2020-01-03 | Discharge: 2020-01-03 | Disposition: A | Discharge status: Home / Self Care | Payer: BC Managed Care – PPO | Attending: Emergency Medicine | Admitting: Emergency Medicine

## 2020-01-03 ENCOUNTER — Emergency Department (HOSPITAL_COMMUNITY): Payer: BC Managed Care – PPO

## 2020-01-03 DIAGNOSIS — R0989 Other specified symptoms and signs involving the circulatory and respiratory systems: Secondary | ICD-10-CM | POA: Insufficient documentation

## 2020-01-03 DIAGNOSIS — T17308A Unspecified foreign body in larynx causing other injury, initial encounter: Secondary | ICD-10-CM

## 2020-01-03 DIAGNOSIS — Z87891 Personal history of nicotine dependence: Secondary | ICD-10-CM | POA: Diagnosis not present

## 2020-01-03 DIAGNOSIS — R059 Cough, unspecified: Secondary | ICD-10-CM

## 2020-01-03 DIAGNOSIS — R058 Other specified cough: Secondary | ICD-10-CM | POA: Diagnosis not present

## 2020-01-03 DIAGNOSIS — Z7982 Long term (current) use of aspirin: Secondary | ICD-10-CM | POA: Diagnosis not present

## 2020-01-03 NOTE — ED Triage Notes (Signed)
Patient stated she feels like a piece of apple is stuck, "I think I inhaled it or something",

## 2020-01-03 NOTE — ED Notes (Signed)
Pt able to drink water without difficulty, pt denies complaint of difficulty drinking water.

## 2020-01-03 NOTE — ED Notes (Signed)
Patient transported to XRAY 

## 2020-01-03 NOTE — ED Provider Notes (Signed)
[facility] EMERGENCY DEPARTMENT Provider Note   CSN: [id number] Arrival date & time: 01/03/20  6644     History Chief Complaint  Patient presents with  . Aspiration    Cassandra Gallagher is a 58 y.o. female who presents to the ED today with chief complaint of aspiration. Pt reports she was eating an apple last night around 9 PM - she states that after a bite she immediately coughed and felt like she was choking. She states that since then she has felt like a piece of apple/apple skin has been stuck in her lung and is concerned she has inhaled it. She states she had a slight cough afterwards and this morning however that has seemed to clear up. She called her PCP to schedule an appointment however was told to come to the ED for further evaluation. Pt has been able to drink coffee and eat toast since then without difficulty. She denies shortness of breath, wheezing, stridor, vomiting, or any other associated symptoms.   The history is provided by the patient and medical records.       Past Medical History:  Diagnosis Date  . Anxiety 06/02/2010  . BACK PAIN 07/26/2007  . 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 Cervicalgia 07/26/2007  . 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 DEPRESSIVE DISORDER 07/26/2007  . Family history of breast cancer   . Grave's disease    hx of Grave's Disease/Hyperthyroidism  . Hx of migraines   . Hyperlipidemia 12/17/2011  . Insomnia 06/02/2010  . Lumbar degenerative disc disease 01/20/2011  . Lump or mass in breast 11/14/2008  . Palpitations 09/21/2008  . UTI 05/11/2007    Patient Active Problem List   Diagnosis Date Noted  . LLQ pain 06/30/2019  . Genetic testing 03/16/2019  . Family history of breast cancer   . Low back pain 01/16/2019  . Left wrist pain 04/05/2018  . Left lateral epicondylitis 04/05/2018  . Sprain, tricep 04/05/2018  . Dizziness 02/22/2018  . Hyperglycemia 02/22/2018  . Tick bite 07/14/2017    . Nonallopathic lesion of thoracic region 09/30/2016  . Nonallopathic lesion of cervical region 09/30/2016  . Nonallopathic lesion of lumbosacral region 09/30/2016  . Hair loss 05/06/2015  . Cough 02/23/2015  . Lumbosacral spondylosis without myelopathy 06/30/2013  . Chronic headache disorder 05/30/2013  . Cervico-occipital neuralgia 05/04/2013  . Cephalalgia 05/04/2013  . Cervical pain 05/04/2013  . Chronic migraine 02/23/2012  . Hyperlipidemia 12/17/2011  . Cervical spondylosis without myelopathy 10/30/2011  . 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 Lumbar degenerative disc disease 01/20/2011  . Chronic pain 09/12/2010  . Anxiety 06/02/2010  . Insomnia 06/02/2010  . Encounter for well adult exam with abnormal findings 06/01/2010  . Lump or mass in breast 11/14/2008  . Depression 07/26/2007    Past Surgical History:  Procedure Laterality Date  . BREAST BIOPSY Right   . CESAREAN SECTION  10/29/2008   twins  . DILITATION & CURRETTAGE/HYSTROSCOPY WITH VERSAPOINT RESECTION N/A 07/20/2012   Procedure: DILATATION & CURETTAGE/HYSTEROSCOPY WITH VERSAPOINT RESECTION;  Surgeon: Princess Bruins, MD;  Location: [facility];  Service: Gynecology;  Laterality: N/A;  . HYSTEROSCOPY WITH D & C  07-20-12     OB History    Gravida  6   Para  3   Term      Preterm      AB  3   Living  3     SAB  3   TAB  Ectopic      Multiple      Live Births              Family History  Problem Relation Age of Onset  . Hypertension Mother   . Breast cancer Mother 7  . Cancer Father        lung  . Cancer Brother        lung   . Breast cancer Maternal Aunt 58  . Breast cancer Paternal Grandmother   . Breast cancer Maternal Grandmother     Social History   Tobacco Use  . Smoking status: Former Smoker    Quit date: 11/24/1996    Years since quitting: 23.1  . Smokeless tobacco: Never Used  . Tobacco comment: smoked for 6 years, quit 15 years ago  Vaping Use  . Vaping  Use: Never used  Substance Use Topics  . Alcohol use: No  . Drug use: No    Home Medications Prior to Admission medications   Medication Sig Start Date End Date Taking? Authorizing Provider  aspirin 325 MG tablet Take 325 mg by mouth daily.   Yes [provider]  clonazePAM (KLONOPIN) 0.5 MG tablet TAKE (1) TABLET TWICE A DAY AS NEEDED. Patient taking differently: Take 0.5 mg by mouth 2 (two) times daily as needed for anxiety.  01/01/20  Yes Biagio Borg, MD  Fremanezumab-vfrm (AJOVY) 225 MG/1.5ML SOSY Inject 3 Syringes into the skin every 3 (three) months.  09/29/17  Yes [provider]  HYDROcodone-acetaminophen (NORCO/VICODIN) 5-325 MG tablet Take 1 tablet by mouth every 6 (six) hours as needed. Patient taking differently: Take 1 tablet by mouth every 6 (six) hours as needed (back pain).  01/16/19  Yes Biagio Borg, MD  RIZATRIPTAN BENZOATE PO Take 1 tablet by mouth daily as needed (migraines).   Yes [provider]  ZOMIG 2.5 MG SOLN Place 1 spray into both nostrils daily as needed (migraines).  07/18/19  Yes [provider]  atorvastatin (LIPITOR) 20 MG tablet Take 1 tablet (20 mg total) by mouth daily. 07/21/19 07/20/20  Biagio Borg, MD  cetirizine (ZYRTEC ALLERGY) 10 MG tablet Take 1 tablet (10 mg total) by mouth daily. Patient not taking: Reported on 01/03/2020 12/30/19   Jaynee Eagles, PA-C  dicyclomine (BENTYL) 10 MG capsule Take 1 capsule (10 mg total) by mouth 4 (four) times daily -  before meals and at bedtime. 07/21/19   Biagio Borg, MD  naproxen (NAPROSYN) 500 MG tablet Take 1 tablet (500 mg total) by mouth 2 (two) times daily with a meal. Patient not taking: Reported on 01/03/2020 12/30/19   Jaynee Eagles, PA-C  pseudoephedrine (SUDAFED) 30 MG tablet Take 1 tablet (30 mg total) by mouth every 8 (eight) hours as needed for congestion. 12/30/19   Jaynee Eagles, PA-C    Allergies    Patient has no known allergies.  Review of Systems   Review of  Systems  Constitutional: Negative for chills and fever.  HENT: Negative for sore throat and trouble swallowing.   Respiratory: Positive for cough. Negative for shortness of breath, wheezing and stridor.   All other systems reviewed and are negative.   Physical Exam Updated Vital Signs BP (!) 131/93 (BP Location: Left Arm)   Pulse 77   Temp 98.3 F (36.8 C) (Oral)   Resp 18   Ht 5\' 7"  (1.702 m)   Wt 70.2 kg   SpO2 97%   BMI 24.24 kg/m   Physical  Exam Vitals and nursing note reviewed.  Constitutional:      Appearance: She is not ill-appearing or diaphoretic.     Comments: Speaking in full sentences without difficulty.  Normal phonation.   HENT:     Head: Normocephalic and atraumatic.     Mouth/Throat:     Mouth: Mucous membranes are moist.     Pharynx: No oropharyngeal exudate or posterior oropharyngeal erythema.     Comments: Uvula is midline. Airway patent.  Eyes:     Conjunctiva/sclera: Conjunctivae normal.  Cardiovascular:     Rate and Rhythm: Normal rate and regular rhythm.     Pulses: Normal pulses.  Pulmonary:     Effort: Pulmonary effort is normal.     Breath sounds: Normal breath sounds. No wheezing, rhonchi or rales.  Chest:     Chest wall: No tenderness.  Abdominal:     Palpations: Abdomen is soft.     Tenderness: There is no abdominal tenderness.  Musculoskeletal:     Cervical back: Neck supple.  Skin:    General: Skin is warm and dry.  Neurological:     Mental Status: She is alert.     ED Results / Procedures / Treatments   Labs (all labs ordered are listed, but only abnormal results are displayed) Labs Reviewed - No data to display  EKG None  Radiology DG Chest 2 View  Result Date: 01/03/2020 CLINICAL DATA:  Concern for potential aspiration of piece of apple EXAM: CHEST - 2 VIEW COMPARISON:  December 22, 2016 FINDINGS: Lungs are clear. Heart size and pulmonary vascularity are normal. No adenopathy evident. No radiopaque foreign body  appreciable. No tracheal narrowing evident. No bony lesions. IMPRESSION: No abnormality appreciable by radiography. Electronically Signed   By: Lowella Grip III M.D.   On: 01/03/2020 10:33    Procedures Procedures (including critical care time)  Medications Ordered in ED Medications - No data to display  ED Course  I have reviewed the triage vital signs and the nursing notes.  Pertinent labs & imaging results that were available during my care of the patient were reviewed by me and considered in my medical decision making (see chart for details).    MDM Rules/Calculators/A&P                            59 year old female presenting to the ED today with concern for aspiration - was eating an apple last night when she immediately coughed upon swallowing and now feels like she may have a piece of apple in her lung. She has been able to eat and drink since then without difficulty. Was told by her PCP to come to the ED for further eval. On arrival VSS. Pt appears to be in NAD. She is phonating normally and speaking in full sentences without difficulty. Uvula is midline and airway is patent. I have low suspicion for food impaction at this time given she has been able to eat and drink without difficulty. She denies any fevers, chills, SOB however will obtain a CXR to assess for concern for aspiration pneumonia.   CXR negative Pt able to tolerate water in the ED without difficulty. Will plan to discharge home at this time with PCP follow up. Suspect some irritation from her coughing last night while eating. Will have pt follow up with her PCP for same. Strict return precautions discussed. Pt is in agreement with plan and stable for discharge home.  Pt has been evaluated by attending physician Dr. Zenia Resides who agrees with plan.   This note was prepared using Dragon voice recognition software and may include unintentional dictation errors due to the inherent limitations of voice recognition  software.  Final Clinical Impression(s) / ED Diagnoses Final diagnoses:  Cough  Choking, initial encounter    Rx / DC Orders ED Discharge Orders    None       Discharge Instructions     Please follow up with your PCP regarding  your ED visit today Return to the ED IMMEDIATELY regarding worsening symptoms including inability to swallow liquids/foods, fevers > 100.4, persistent cough, shortness of breath, or any other new/concerning symptoms        Eustaquio Maize, PA-C 01/03/20 1228    Lacretia Leigh, MD 01/04/20 1650

## 2020-01-03 NOTE — ED Notes (Signed)
Review D/C papers with pt, pt states understanding, pt denies questions at this time.

## 2020-01-03 NOTE — Discharge Instructions (Signed)
Please follow up with your PCP regarding  your ED visit today Return to the ED IMMEDIATELY regarding worsening symptoms including inability to swallow liquids/foods, fevers > 100.4, persistent cough, shortness of breath, or any other new/concerning symptoms

## 2020-01-03 NOTE — ED Provider Notes (Signed)
Medical screening examination/treatment/procedure(s) were conducted as a shared visit with non-physician practitioner(s) and myself.  I personally evaluated the patient during the encounter.    59 year old female who presents after possible aspirating an object.  Chest x-ray is negative.  She has no signs of respiratory compromise will be discharged home   Lacretia Leigh, MD 01/03/20 1233

## 2020-01-03 NOTE — Telephone Encounter (Signed)
[facility] called   Patient states she was eating an apple last night and thinks she inhaled a piece of it. States she is having aching in her chest and strange feeling in her throat. She is able to swallow. No SOB or coughing at present. States pain is achy and discomfort.   Advised by [facility] to go to ED now. Patient understands and complies. Planning on going to Urgent Care/Walk-in clinic

## 2020-01-25 ENCOUNTER — Other Ambulatory Visit: Payer: Self-pay | Admitting: Plastic Surgery

## 2020-01-25 HISTORY — PX: BREAST SURGERY: SHX581

## 2020-03-10 ENCOUNTER — Encounter: Payer: Self-pay | Admitting: Internal Medicine

## 2020-03-10 DIAGNOSIS — U071 COVID-19: Secondary | ICD-10-CM

## 2020-03-11 ENCOUNTER — Telehealth: Payer: BC Managed Care – PPO | Admitting: Physician Assistant

## 2020-03-11 DIAGNOSIS — U071 COVID-19: Secondary | ICD-10-CM

## 2020-03-11 MED ORDER — BENZONATATE 100 MG PO CAPS: 100.0000 mg | ORAL_CAPSULE | Freq: Three times a day (TID) | ORAL | 0 refills | Status: DC | PRN

## 2020-03-11 MED ORDER — ALBUTEROL SULFATE HFA 108 (90 BASE) MCG/ACT IN AERS: 2.0000 | INHALATION_SPRAY | Freq: Four times a day (QID) | RESPIRATORY_TRACT | 0 refills | Status: DC | PRN

## 2020-03-11 NOTE — Progress Notes (Signed)
We are sorry you are not feeling well. We are here to help!  You have tested positive for COVID-19, meaning that you were infected with the novel coronavirus and could give the virus to others.  It is vitally important that you stay home so you do not spread it to others.      Please continue isolation at home, for at least 10 days since the start of your symptoms and until you have had 24 hours with no fever (without taking a fever reducer) and with improving of symptoms.  If you have no symptoms but tested positive (or all symptoms resolve after 5 days and you have no fever) you can leave your house but continue to wear a mask around others for an additional 5 days. If you have a fever,continue to stay home until you have had 24 hours of no fever. Most cases improve 5-10 days from onset but we have seen a small number of patients who have gotten worse after the 10 days.  Please be sure to watch for worsening symptoms and remain taking the proper precautions.   I see you have spoken with your primary care provider yesterday and he has sent in an inhaler for you to use as directed. Definitely continue with this. I am adding on a prescription cough medication as well to help calm things down (see below)  Go to the nearest hospital ED for assessment if fever/cough/breathlessness are severe or illness seems like a threat to life.    The following symptoms may appear 2-14 days after exposure: . Fever . Cough . Shortness of breath or difficulty breathing . Chills . Repeated shaking with chills . Muscle pain . Headache . Sore throat . New loss of taste or smell . Fatigue . Congestion or runny nose . Nausea or vomiting . Diarrhea  You have been enrolled in [place] for COVID-19. Daily you will receive a questionnaire within the [facility] website. Our COVID-19 response team will be monitoring your responses daily.  You can use medication such as A prescription cough medication  called Tessalon Perles 100 mg. You may take 1-2 capsules every 8 hours as needed for cough. I see your PCP sent your information to the antibody infusion clinic. They will reach out to you if you qualify but being 7 days since symptom onset you will likely not qualify.   You may also take acetaminophen (Tylenol) as needed for fever.  HOME CARE: . Only take medications as instructed by your medical team. . Drink plenty of fluids and get plenty of rest. . A steam or ultrasonic humidifier can help if you have congestion.   GET HELP RIGHT AWAY IF YOU HAVE EMERGENCY WARNING SIGNS.  Call 911 or proceed to your closest emergency facility if: . You develop worsening high fever. . Trouble breathing . Bluish lips or face . Persistent pain or pressure in the chest . New confusion . Inability to wake or stay awake . You cough up blood. . Your symptoms become more severe . Inability to hold down food or fluids  This list is not all possible symptoms. Contact your medical provider for any symptoms that are severe or concerning to you.    Your e-visit answers were reviewed by a board certified advanced clinical practitioner to complete your personal care plan.  Depending on the condition, your plan could have included both over the counter or prescription medications.  If there is a problem please reply once  you have received a response from your provider.  Your safety is important to [place].  If you have drug allergies check your prescription carefully.    You can use MyChart to ask questions about today's visit, request a non-urgent call back, or ask for a work or school excuse for 24 hours related to this e-Visit. If it has been greater than 24 hours you will need to follow up with your provider, or enter a new e-Visit to address those concerns. You will get an e-mail in the next two days asking about your experience.  I hope that your e-visit has been valuable and will speed your recovery. Thank you  for using e-visits.

## 2020-03-11 NOTE — Progress Notes (Signed)
I have spent 5 minutes in review of e-visit questionnaire, review and updating patient chart, medical decision making and response to patient.   Abbott Jasinski Cody Chanita Boden, PA-C    

## 2020-03-12 ENCOUNTER — Ambulatory Visit: Payer: BC Managed Care – PPO | Admitting: Obstetrics & Gynecology

## 2020-03-25 ENCOUNTER — Other Ambulatory Visit: Payer: Self-pay | Admitting: Internal Medicine

## 2020-04-08 ENCOUNTER — Encounter: Payer: Self-pay | Admitting: Family Medicine

## 2020-04-08 DIAGNOSIS — M722 Plantar fascial fibromatosis: Secondary | ICD-10-CM

## 2020-04-08 DIAGNOSIS — G8929 Other chronic pain: Secondary | ICD-10-CM

## 2020-04-12 ENCOUNTER — Ambulatory Visit: Payer: BC Managed Care – PPO | Admitting: Obstetrics & Gynecology

## 2020-04-12 ENCOUNTER — Other Ambulatory Visit: Payer: Self-pay

## 2020-04-12 ENCOUNTER — Encounter: Payer: Self-pay | Admitting: Obstetrics & Gynecology

## 2020-04-12 VITALS — BP 128/84 | Ht 66.5 in | Wt 156.0 lb

## 2020-04-12 DIAGNOSIS — Z803 Family history of malignant neoplasm of breast: Secondary | ICD-10-CM | POA: Diagnosis not present

## 2020-04-12 DIAGNOSIS — Z01419 Encounter for gynecological examination (general) (routine) without abnormal findings: Secondary | ICD-10-CM | POA: Diagnosis not present

## 2020-04-12 DIAGNOSIS — Z780 Asymptomatic menopausal state: Secondary | ICD-10-CM | POA: Diagnosis not present

## 2020-04-12 NOTE — Progress Notes (Signed)
Cassandra Gallagher [facility] [birth year] [id number]   History:    60 y.o.  [id number] married.  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  RP:  Established patient presenting for annual gyn exam   HPI: Postmenopausal, well on no hormone replacement therapy for the last 5 years.  No postmenopausal bleeding.  No pelvic pain.  No pain with intercourse.  Urine and bowel movements normal.  Breasts normal, s/p bilateral breast reduction 01/2020.  Patho: Rt breast with 1 site/12 with Atypical Lobular Hyperplasia.  No In Situ or Invasive Ca. BrCa 1-2 Neg.  Body mass index improved to 24.8.  Started back playing tennis and cycles.  Health labs with family physician.  Colonoscopy 2018.   Past medical history,surgical history, family history and social history were all reviewed and documented in the EPIC chart.  Gynecologic History No LMP recorded. (Menstrual status: Perimenopausal).  Obstetric History OB History  Gravida Para Term Preterm AB Living  '6 3     3 3  ' SAB IAB Ectopic Multiple Live Births  3            # Outcome Date GA Lbr Len/2nd Weight Sex Delivery Anes PTL Lv  6 SAB           5 SAB           4 SAB           3 Para           2 Para           1 Para              ROS: A ROS was performed and pertinent positives and negatives are included in the history.  GENERAL: No fevers or chills. HEENT: No change in vision, no earache, sore throat or sinus congestion. NECK: No pain or stiffness. CARDIOVASCULAR: No chest pain or pressure. No palpitations. PULMONARY: No shortness of breath, cough or wheeze. GASTROINTESTINAL: No abdominal pain, nausea, vomiting or diarrhea, melena or bright red blood per rectum. GENITOURINARY: No urinary frequency, urgency, hesitancy or dysuria. MUSCULOSKELETAL: No joint or muscle pain, no back pain, no recent trauma. DERMATOLOGIC: No rash, no itching, no lesions. ENDOCRINE: No polyuria, polydipsia, no heat or cold intolerance. No recent change in weight.  HEMATOLOGICAL: No anemia or easy bruising or bleeding. NEUROLOGIC: No headache, seizures, numbness, tingling or weakness. PSYCHIATRIC: No depression, no loss of interest in normal activity or change in sleep pattern.     Exam:   BP 128/84   Ht 5' 6.5" (1.689 m)   Wt 156 lb (70.8 kg)   BMI 24.80 kg/m   Body mass index is 24.8 kg/m.  General appearance : Well developed well nourished female. No acute distress HEENT: Eyes: no retinal hemorrhage or exudates,  Neck supple, trachea midline, no carotid bruits, no thyroidmegaly Lungs: Clear to auscultation, no rhonchi or wheezes, or rib retractions  Heart: Regular rate and rhythm, no murmurs or gallops Breast:Examined in sitting and supine position were symmetrical in appearance, no palpable masses or tenderness,  no skin retraction, no nipple inversion, no nipple discharge, no skin discoloration, no axillary or supraclavicular lymphadenopathy Abdomen: no palpable masses or tenderness, no rebound or guarding Extremities: no edema or skin discoloration or tenderness  Pelvic: Vulva: Normal             Vagina: No gross lesions or discharge  Cervix: No gross lesions or discharge.  Pap reflex done.  Uterus  AV, normal size, shape and  consistency, non-tender and mobile  Adnexa  Without masses or tenderness  Anus: Normal   Assessment/Plan:  60 y.o. female for annual exam   1. Encounter for routine gynecological examination with Papanicolaou smear of cervix Normal gynecologic exam.  Pap reflex done.  Improved BMI at 24.8.  [facility] 2018.  Health labs with Fam MD.  2. Postmenopause Well on no HRT x 5 yrs.  No PMB.  Vit D supplements, Ca++ 1.5 g/d total, weight bearing physical activities. BD at 61.  3. Family history of breast cancer Patient is BrCa 1-2 neg.  Bilateral Breast reduction 01/2020.  Rt breast: Incidental Atypical Lobular Hyperplasia in 1/12 site.  Breast exam normal today.  Screening/Dx Mammo annually.  Princess Bruins MD, 10:23  AM 04/12/2020

## 2020-04-12 NOTE — Addendum Note (Signed)
Addended by: Thurnell Garbe A on: 04/12/2020 10:48 AM   Modules accepted: Orders

## 2020-04-18 ENCOUNTER — Ambulatory Visit: Payer: BC Managed Care – PPO | Admitting: Podiatry

## 2020-04-18 LAB — PAP IG W/ RFLX HPV ASCU

## 2020-04-18 LAB — HUMAN PAPILLOMAVIRUS, HIGH RISK: HPV DNA High Risk: NOT DETECTED

## 2020-04-25 ENCOUNTER — Ambulatory Visit: Payer: BC Managed Care – PPO | Admitting: Podiatry

## 2020-04-25 ENCOUNTER — Other Ambulatory Visit: Payer: Self-pay | Admitting: Podiatry

## 2020-04-25 ENCOUNTER — Other Ambulatory Visit: Payer: Self-pay

## 2020-04-25 ENCOUNTER — Encounter: Payer: Self-pay | Admitting: Podiatry

## 2020-04-25 ENCOUNTER — Ambulatory Visit (INDEPENDENT_AMBULATORY_CARE_PROVIDER_SITE_OTHER): Payer: BC Managed Care – PPO

## 2020-04-25 DIAGNOSIS — D3613 Benign neoplasm of peripheral nerves and autonomic nervous system of lower limb, including hip: Secondary | ICD-10-CM

## 2020-04-25 DIAGNOSIS — G5761 Lesion of plantar nerve, right lower limb: Secondary | ICD-10-CM | POA: Diagnosis not present

## 2020-04-25 DIAGNOSIS — R112 Nausea with vomiting, unspecified: Secondary | ICD-10-CM | POA: Insufficient documentation

## 2020-04-25 DIAGNOSIS — K219 Gastro-esophageal reflux disease without esophagitis: Secondary | ICD-10-CM | POA: Insufficient documentation

## 2020-04-25 DIAGNOSIS — D2120 Benign neoplasm of connective and other soft tissue of unspecified lower limb, including hip: Secondary | ICD-10-CM

## 2020-04-25 NOTE — Progress Notes (Signed)
Subjective:  Patient ID: Cassandra Gallagher, female    DOB: 07-06-1960,  MRN: [id number] HPI Chief Complaint  Patient presents with  . Foot Pain    Arch/3rd, 4th, 5th toes right - aching x 2 years, initially only after playing tennis, noticed toes would go numb, Sports-Med doc said had nodule in arch-injected-no help, tried padding and Birkenstocks. She saw another docs 2 weeks ago and said she had "swelling of the nerve" and injected-some better, but still wanted another opinion, left foot hurts occasionally  . New Patient (Initial Visit)    60 y.o. female presents with the above complaint.   ROS: Denies fever chills nausea vomiting muscle aches pains calf pain back pain chest pain shortness of breath.  Past Medical History:  Diagnosis Date  . Anxiety 06/02/2010  . BACK PAIN 07/26/2007  . 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 Cervicalgia 07/26/2007  . 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 DEPRESSIVE DISORDER 07/26/2007  . Family history of breast cancer   . Grave's disease    hx of Grave's Disease/Hyperthyroidism  . Hx of migraines   . Hyperlipidemia 12/17/2011  . Insomnia 06/02/2010  . Lumbar degenerative disc disease 01/20/2011  . Lump or mass in breast 11/14/2008  . Palpitations 09/21/2008  . UTI 05/11/2007   Past Surgical History:  Procedure Laterality Date  . BREAST BIOPSY Right   . BREAST SURGERY Bilateral 01/25/2020   BREAST REDUCTION   . CESAREAN SECTION  10/29/2008   twins  . DILITATION & CURRETTAGE/HYSTROSCOPY WITH VERSAPOINT RESECTION N/A 07/20/2012   Procedure: DILATATION & CURETTAGE/HYSTEROSCOPY WITH VERSAPOINT RESECTION;  Surgeon: Princess Bruins, MD;  Location: [facility];  Service: Gynecology;  Laterality: N/A;  . HYSTEROSCOPY WITH D & C  07-20-12    Current Outpatient Medications:  .  Diclofenac Potassium,Migraine, (CAMBIA) 50 MG PACK, , Disp: , Rfl:  .  aspirin 325 MG tablet, Take 325 mg by mouth daily., Disp: , Rfl:  .  atorvastatin  (LIPITOR) 20 MG tablet, Take 1 tablet (20 mg total) by mouth daily., Disp: 90 tablet, Rfl: 3 .  cetirizine (ZYRTEC ALLERGY) 10 MG tablet, Take 1 tablet (10 mg total) by mouth daily., Disp: 30 tablet, Rfl: 0 .  clonazePAM (KLONOPIN) 0.5 MG tablet, TAKE (1) TABLET TWICE A DAY AS NEEDED., Disp: 60 tablet, Rfl: 2 .  dicyclomine (BENTYL) 10 MG capsule, Take 1 capsule (10 mg total) by mouth 4 (four) times daily -  before meals and at bedtime., Disp: 90 capsule, Rfl: 5 .  Fremanezumab-vfrm 225 MG/1.5ML SOSY, Inject 3 Syringes into the skin every 3 (three) months. , Disp: , Rfl:  .  HYDROcodone-acetaminophen (NORCO/VICODIN) 5-325 MG tablet, Take 1 tablet by mouth every 6 (six) hours as needed. (Patient taking differently: Take 1 tablet by mouth every 6 (six) hours as needed (back pain).), Disp: 10 tablet, Rfl: 0 .  methocarbamol (ROBAXIN) 500 MG tablet, Take by mouth., Disp: , Rfl:  .  naproxen (NAPROSYN) 500 MG tablet, Take 1 tablet (500 mg total) by mouth 2 (two) times daily with a meal., Disp: 30 tablet, Rfl: 0 .  pseudoephedrine (SUDAFED) 30 MG tablet, Take 1 tablet (30 mg total) by mouth every 8 (eight) hours as needed for congestion., Disp: 30 tablet, Rfl: 0 .  RIZATRIPTAN BENZOATE PO, Take 1 tablet by mouth daily as needed (migraines)., Disp: , Rfl:  .  ZOMIG 2.5 MG SOLN, Place 1 spray into both nostrils daily as needed (migraines). , Disp: , Rfl:   No  Known Allergies Review of Systems Objective:  There were no vitals filed for this visit.  General: Well developed, nourished, in no acute distress, alert and oriented x3   Dermatological: Skin is warm, dry and supple bilateral. Nails x 10 are well maintained; remaining integument appears unremarkable at this time. There are no open sores, no preulcerative lesions, no rash or signs of infection present.  Vascular: Dorsalis Pedis artery and Posterior Tibial artery pedal pulses are 2/4 bilateral with immedate capillary fill time. Pedal hair growth  present. No varicosities and no lower extremity edema present bilateral.   Neruologic: Grossly intact via light touch bilateral. Vibratory intact via tuning fork bilateral. Protective threshold with Semmes Wienstein monofilament intact to all pedal sites bilateral. Patellar and Achilles deep tendon reflexes 2+ bilateral. No Babinski or clonus noted bilateral.  Palpable Mulder's click third interspace of the right foot  Musculoskeletal: No gross boney pedal deformities bilateral. No pain, crepitus, or limitation noted with foot and ankle range of motion bilateral. Muscular strength 5/5 in all groups tested bilateral.  Plantar fibroma forefoot left  Gait: Unassisted, Nonantalgic.    Radiographs:  Radiographs demonstrate an osseously mature individual with no significant acute osseous findings.  Assessment & Plan:   Assessment: Neuroma third interspace right foot being treated by Dr. Gershon Mussel.  Plantar fibroma treated by sports medicine with injection and resolving.  Plan: See the above     Cassandra Gallagher, [name]

## 2020-06-18 ENCOUNTER — Other Ambulatory Visit: Payer: Self-pay | Admitting: Internal Medicine

## 2020-06-24 ENCOUNTER — Encounter: Payer: Self-pay | Admitting: Internal Medicine

## 2020-06-24 ENCOUNTER — Other Ambulatory Visit: Payer: Self-pay

## 2020-06-24 ENCOUNTER — Ambulatory Visit: Payer: BC Managed Care – PPO | Admitting: Internal Medicine

## 2020-06-24 VITALS — BP 126/78 | HR 77 | Temp 98.0°F | Ht 66.5 in | Wt 156.8 lb

## 2020-06-24 DIAGNOSIS — R21 Rash and other nonspecific skin eruption: Secondary | ICD-10-CM | POA: Diagnosis not present

## 2020-06-24 MED ORDER — DOXYCYCLINE HYCLATE 100 MG PO TABS: 100.0000 mg | ORAL_TABLET | Freq: Two times a day (BID) | ORAL | 0 refills | Status: AC

## 2020-06-24 NOTE — Patient Instructions (Signed)
Take the antibiotic as prescribed.      Tick Bite Information, Adult Ticks are insects that draw blood for food. Most ticks live in shrubs and grassy and wooded areas. They climb onto people and animals that brush against the leaves and grasses that they rest on. Then they bite, attaching themselves to the skin. Most ticks are harmless, but some ticks may carry germs that can spread to a person through a bite and cause a disease. To reduce your risk of getting a disease from a tick bite, make sure you:  Take steps to prevent tick bites.  Check for ticks after being outdoors where ticks live.  Watch for symptoms of disease if a tick attached to you or if you suspect a tick bite. How can I prevent tick bites? Take these steps to help prevent tick bites when you go outdoors in an area where ticks live: Use insect repellent  Use insect repellent that has DEET (20% or higher), picaridin, or IR3535 in it. Follow the instructions on the label. Use these products on: ? Bare skin. ? The top of your boots. ? Your pant legs. ? Your sleeve cuffs.  For insect repellent that contains permethrin, follow the instructions on the label. Use these products on: ? Clothing. ? Boots. ? Outdoor gear. ? Tents. When you are outside  Wear protective clothing. Long sleeves and long pants offer the best protection from ticks.  Wear light-colored clothing so you can see ticks more easily.  Tuck your pant legs into your socks.  If you go walking on a trail, stay in the middle of the trail so your skin, hair, and clothing do not touch the bushes.  Avoid walking through areas with long grass.  Check for ticks on your clothing, hair, and skin often while you are outside, and check again before you go inside. Make sure to check the scalp, neck, armpits, waist, groin, and joint areas. These are the spots where ticks attach themselves most often. When you go indoors  Check your clothing for ticks. Tumble dry  clothes in a dryer on high heat for at least 10 minutes. If clothes are damp, additional time may be needed. If clothes require washing, use hot water.  Examine gear and pets.  Shower soon after being outdoors.  Check your body for ticks. Conduct a full body check using a mirror. What is the proper way to remove a tick? If you find a tick on your body, remove it as soon as possible. Removing a tick sooner can prevent germs from passing to your body. Do not remove the tick with your bare fingers. To remove a tick that is crawling on your skin but has not bitten, use either of these methods:  Go outdoors and brush the tick off.  Remove the tick with tape or a lint roller. To remove a tick that is attached to your skin: 1. Wash your hands. If you have latex gloves, put them on. 2. Use fine-tipped tweezers, curved forceps, or a tick-removal tool to gently grasp the tick as close to your skin and the tick's head as possible. 3. Gently pull with a steady, upward, even pressure until the tick lets go. 4. When removing the tick: ? Take care to keep the tick's head attached to its body. ? Do not twist or jerk the tick. This can make the tick's head or mouth parts break off and remain in the skin. ? Do not squeeze or crush the  tick's body. This could force disease-carrying fluids from the tick into your body. Do not try to remove a tick with heat, alcohol, petroleum jelly, or fingernail polish. Using these methods can cause the tick to salivate and regurgitate into your bloodstream, increasing your risk of getting a disease.   What should I do after removing a tick?  Dispose of the tick. Do not crush a tick with your fingers.  Clean the bite area and your hands with soap and water, rubbing alcohol, or an iodine scrub.  If an antiseptic cream or ointment is available, apply a small amount to the bite site.  Wash and disinfect any instruments that you used to remove the tick. How should I dispose  of a tick? To dispose of a live tick, use one of these methods:  Place it in rubbing alcohol.  Place it in a sealed bag or container.  Wrap it tightly in tape.  Flush it down the toilet. Contact a health care provider if:  You have symptoms of a disease after a tick bite. Symptoms of a tick-borne disease can occur from moments after the tick bites to 30 days after a tick is removed. Symptoms include: ? Fever or chills. ? Any of these signs in the bite area:  A red rash that makes a circle (bull's-eye rash) in the bite area.  Redness and swelling. ? Headache. ? Muscle, joint, or bone pain. ? Abnormal tiredness. ? Numbness in your legs or difficulty walking or moving your legs. ? Tender, swollen lymph glands.  A part of a tick breaks off and gets stuck in your skin. Get help right away if:  You are not able to remove a tick.  You experience muscle weakness or paralysis.  Your symptoms get worse or you experience new symptoms.  You find an engorged tick on your skin and you are in an area where disease from ticks is a high risk. Summary  Ticks may carry germs that can spread to a person through a bite and cause a disease.  Wear protective clothing and use insect repellent to prevent tick bites. Follow the instructions on the label.  If you find a tick on your body, remove it as soon as possible. If the tick is attached, do not try to remove with heat, alcohol, petroleum jelly, or fingernail polish.  Remove the attached tick using fine-tipped tweezers, curved forceps, or a tick-removal tool. Gently pull with steady, upward, even pressure until the tick lets go. Do not twist or jerk the tick. Do not squeeze or crush the tick's body.  If you have symptoms of a disease after being bitten by a tick, contact a health care provider. This information is not intended to replace advice given to you by your health care provider. Make sure you discuss any questions you have with your  health care provider. Document Revised: 01/23/2019 Document Reviewed: 01/23/2019 Elsevier Patient Education  2021 [employer].

## 2020-06-24 NOTE — Progress Notes (Signed)
Subjective:    Patient ID: Cassandra Gallagher, female    DOB: August 11, 1960, 60 y.o.   MRN: [id number]  HPI The patient is here for an acute visit.  Rash underneath left breast -  She noticed it 5-6 days ago.  It is irritated by her bra, which is why she really noticed it.  She denies any pain.  At times there might be a slight itch, but not very itchy.  She is unsure if she was bit or not, but is concerned about the possibility of a tick bite.  She is outdoors a lot and has gotten bit recently by couple of ticks.    She is not having any other concerning symptoms.    Medications and allergies reviewed with patient and updated if appropriate.  Patient Active Problem List   Diagnosis Date Noted  . Gastroesophageal reflux disease 04/25/2020  . Nausea and vomiting 04/25/2020  . LLQ pain 06/30/2019  . Genetic testing 03/16/2019  . Family history of breast cancer   . Low back pain 01/16/2019  . Left wrist pain 04/05/2018  . Left lateral epicondylitis 04/05/2018  . Sprain, tricep 04/05/2018  . Dizziness 02/22/2018  . Hyperglycemia 02/22/2018  . Tick bite 07/14/2017  . Nonallopathic lesion of thoracic region 09/30/2016  . Nonallopathic lesion of cervical region 09/30/2016  . Nonallopathic lesion of lumbosacral region 09/30/2016  . Hair loss 05/06/2015  . Cough 02/23/2015  . Lumbosacral spondylosis without myelopathy 06/30/2013  . Chronic headache disorder 05/30/2013  . Cervico-occipital neuralgia 05/04/2013  . Cephalalgia 05/04/2013  . Cervical pain 05/04/2013  . Chronic migraine 02/23/2012  . Hyperlipidemia 12/17/2011  . Cervical spondylosis without myelopathy 10/30/2011  . 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 Lumbar degenerative disc disease 01/20/2011  . Chronic pain 09/12/2010  . Anxiety 06/02/2010  . Insomnia 06/02/2010  . Encounter for well adult exam with abnormal findings 06/01/2010  . Lump or mass in breast 11/14/2008  . Depression 07/26/2007    Current  Outpatient Medications on File Prior to Visit  Medication Sig Dispense Refill  . aspirin 325 MG tablet Take 325 mg by mouth daily.    Marland Kitchen atorvastatin (LIPITOR) 20 MG tablet Take 1 tablet (20 mg total) by mouth daily. 90 tablet 3  . cetirizine (ZYRTEC ALLERGY) 10 MG tablet Take 1 tablet (10 mg total) by mouth daily. 30 tablet 0  . clonazePAM (KLONOPIN) 0.5 MG tablet TAKE ONE TABLET BY MOUTH TWICE DAILY AS NEEDED 60 tablet 0  . Fremanezumab-vfrm 225 MG/1.5ML SOSY Inject 3 Syringes into the skin every 3 (three) months.     Marland Kitchen HYDROcodone-acetaminophen (NORCO/VICODIN) 5-325 MG tablet Take 1 tablet by mouth every 6 (six) hours as needed. (Patient taking differently: Take 1 tablet by mouth every 6 (six) hours as needed (back pain).) 10 tablet 0  . pseudoephedrine (SUDAFED) 30 MG tablet Take 1 tablet (30 mg total) by mouth every 8 (eight) hours as needed for congestion. 30 tablet 0  . RIZATRIPTAN BENZOATE PO Take 1 tablet by mouth daily as needed (migraines).    Marland Kitchen ZOMIG 2.5 MG SOLN Place 1 spray into both nostrils daily as needed (migraines).     . Diclofenac Potassium,Migraine, (CAMBIA) 50 MG PACK  (Patient not taking: Reported on 06/24/2020)    . dicyclomine (BENTYL) 10 MG capsule Take 1 capsule (10 mg total) by mouth 4 (four) times daily -  before meals and at bedtime. (Patient not taking: Reported on 06/24/2020) 90 capsule 5   No current  facility-administered medications on file prior to visit.    Past Medical History:  Diagnosis Date  . Anxiety 06/02/2010  . BACK PAIN 07/26/2007  . 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 Cervicalgia 07/26/2007  . 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 DEPRESSIVE DISORDER 07/26/2007  . Family history of breast cancer   . Grave's disease    hx of Grave's Disease/Hyperthyroidism  . Hx of migraines   . Hyperlipidemia 12/17/2011  . Insomnia 06/02/2010  . Lumbar degenerative disc disease 01/20/2011  . Lump or mass in breast 11/14/2008  . Palpitations  09/21/2008  . UTI 05/11/2007    Past Surgical History:  Procedure Laterality Date  . BREAST BIOPSY Right   . BREAST SURGERY Bilateral 01/25/2020   BREAST REDUCTION   . CESAREAN SECTION  10/29/2008   twins  . DILITATION & CURRETTAGE/HYSTROSCOPY WITH VERSAPOINT RESECTION N/A 07/20/2012   Procedure: DILATATION & CURETTAGE/HYSTEROSCOPY WITH VERSAPOINT RESECTION;  Surgeon: Princess Bruins, MD;  Location: [facility];  Service: Gynecology;  Laterality: N/A;  . HYSTEROSCOPY WITH D & C  07-20-12    Social History   Socioeconomic History  . Marital status: Married    Spouse name: Not on file  . Number of children: 2  . Years of education: Not on file  . Highest education level: Not on file  Occupational History  . Occupation: Engineer, building services: [employer]  Tobacco Use  . Smoking status: Former Smoker    Quit date: 11/24/1996    Years since quitting: 23.5  . Smokeless tobacco: Never Used  . Tobacco comment: smoked for 6 years, quit 15 years ago  Vaping Use  . Vaping Use: Never used  Substance and Sexual Activity  . Alcohol use: No  . Drug use: No  . Sexual activity: Yes    Comment: married- 20 yrs   Other Topics Concern  . Not on file  Social History Narrative   2nd child born recently in September 2010.   Married, one child from previous marriage lives in [place]   Social Determinants of Health   Financial Resource Strain: Not on file  Food Insecurity: Not on file  Transportation Needs: Not on file  Physical Activity: Not on file  Stress: Not on file  Social Connections: Not on file    Family History  Problem Relation Age of Onset  . Hypertension Mother   . Breast cancer Mother 29  . Cancer Father        lung  . Cancer Brother        lung   . Breast cancer Maternal Aunt 58  . Breast cancer Paternal Grandmother   . Breast cancer Maternal Grandmother     Review of Systems  Constitutional: Negative for chills, fatigue and fever.  Musculoskeletal:  Negative for arthralgias.  Skin: Positive for rash. Negative for wound.  Neurological: Negative for headaches.       Objective:   Vitals:   06/24/20 0924  BP: 126/78  Pulse: 77  Temp: 98 F (36.7 C)  SpO2: 96%   BP Readings from Last 3 Encounters:  06/24/20 126/78  04/12/20 128/84  01/03/20 127/78   Wt Readings from Last 3 Encounters:  06/24/20 156 lb 12.8 oz (71.1 kg)  04/12/20 156 lb (70.8 kg)  01/03/20 154 lb 12.2 oz (70.2 kg)   Body mass index is 24.93 kg/m.   Physical Exam Constitutional:      General: She is not in acute distress.  Appearance: Normal appearance. She is not ill-appearing.  HENT:     Head: Normocephalic and atraumatic.  Skin:    General: Skin is warm and dry.     Findings: Erythema (Oval shaped area of erythema left lower ribs/below breast-variation in color, slight warmth, blanches, no blisters or wound) present.  Neurological:     Mental Status: She is alert.            Assessment & Plan:    Rash, nonspecific skin eruption: Acute Concern for possible tick bite-questionable atypical erythema migrans She has had several tick bites and is very concerned about the possibility of Lyme No other concerning symptoms Discussed options at this point that this could possibly represent erythema migrans and she could be at risk of Lyme disease, but it is difficult to tell.  She is interested in starting medication and the possibility that this is early Lyme Start doxycycline 100 mg twice daily x10 days.  Discussed possible side effects Discussed tick prevention   This visit occurred during the SARS-CoV-2 public health emergency.  Safety protocols were in place, including screening questions prior to the visit, additional usage of staff PPE, and extensive cleaning of exam room while observing appropriate contact time as indicated for disinfecting solutions.

## 2020-07-15 ENCOUNTER — Other Ambulatory Visit: Payer: Self-pay | Admitting: Internal Medicine

## 2020-08-28 ENCOUNTER — Other Ambulatory Visit: Payer: Self-pay | Admitting: Internal Medicine

## 2020-08-28 NOTE — Telephone Encounter (Signed)
Ok to contact pt  Klonopin refilled x 1 mo  Pt last visit July 2021  Please to make ROV for further refills

## 2020-08-29 ENCOUNTER — Other Ambulatory Visit: Payer: Self-pay

## 2020-08-29 ENCOUNTER — Ambulatory Visit: Payer: BC Managed Care – PPO | Admitting: Obstetrics & Gynecology

## 2020-08-29 ENCOUNTER — Encounter: Payer: Self-pay | Admitting: Obstetrics & Gynecology

## 2020-08-29 VITALS — BP 120/78 | HR 78 | Resp 16

## 2020-08-29 DIAGNOSIS — N9411 Superficial (introital) dyspareunia: Secondary | ICD-10-CM

## 2020-08-29 DIAGNOSIS — R102 Pelvic and perineal pain: Secondary | ICD-10-CM

## 2020-08-29 NOTE — Progress Notes (Signed)
    Cassandra Gallagher January 01, 1961 [id number]        60 y.o.  [id number]   RP: Pulling, tugging feeling more on left side  HPI: Pulling, tugging feeling more on left side intermittently.  No vaginal bulging seen.  Difficult urination. Patient read about pelvic floor relaxation and thinks that her Sxs correspond to that condition.  Postmenopausal, well on no hormone replacement therapy for > 5 years.  No postmenopausal bleeding.  C/O difficult penetration, obstruction?  Bowel movements normal.   OB History  Gravida Para Term Preterm AB Living  '6 3     3 3  '$ SAB IAB Ectopic Multiple Live Births  3            # Outcome Date GA Lbr Len/2nd Weight Sex Delivery Anes PTL Lv  6 SAB           5 SAB           4 SAB           3 Para           2 Para           1 Para             Past medical history,surgical history, problem list, medications, allergies, family history and social history were all reviewed and documented in the EPIC chart.   Directed ROS with pertinent positives and negatives documented in the history of present illness/assessment and plan.  Exam:  Vitals:   08/29/20 1141  BP: 120/78  Pulse: 78  Resp: 16   General appearance:  Normal  Abdomen: Normal  Gynecologic exam: Vulva normal.  Bimanual exam with valsalva in supine position:  Uterus AV, normal volume, NT, mobile.  No adnexal mass, NT.  No uterine prolapse, no cystocele, no rectocele.  Vaginal exam with valsalva in standing position:  Minimal Cystocele grade 0-1/4.  Minimal Rectocele grade 0-1/3.  U/A: Did not leave a sample   Assessment/Plan:  60 y.o. [id number]   1. Pelvic pressure in female Gynecologic exam findings reviewed with patient.  Patient informed that her symptoms are very unlikely to be due to any Pelvic Floor Relaxation as it is too minimal to give the level of severe pain she is experiencing.  Will complete the Gynecologic evaluation with a Pelvic US at f/u.  Could be intestinal pain or  muscle/pelvic joint pain.  Recommend GI/PT and/or Fam MD evaluation. - US Transvaginal Non-OB; Future - Urinalysis,Complete w/RFL Culture- No sample available  2. Superficial (introital) dyspareunia  Difficult entrance in the vagina and pain at the introitus.  No mechanical obstruction.  Could be Vaginismus.  Declines local Estrogen therapy.  Recommend trying coconut oil.  Princess Bruins MD, 11:48 AM 08/29/2020

## 2020-08-31 ENCOUNTER — Encounter: Payer: Self-pay | Admitting: Obstetrics & Gynecology

## 2020-09-02 ENCOUNTER — Other Ambulatory Visit: Payer: Self-pay

## 2020-09-02 DIAGNOSIS — R102 Pelvic and perineal pain: Secondary | ICD-10-CM

## 2020-09-11 ENCOUNTER — Telehealth: Payer: Self-pay

## 2020-09-11 NOTE — Telephone Encounter (Signed)
Patient sent a My Chart message: "The ultrasound is scheduled fir August 25. Is it possible to do sooner.   The pain in my lower abdomen is really difficult to live with."  Our office does not have a sooner u/s appt available.  Would you be okay with [place] trying to get her in sooner at [facility]?  Would you want her to still have visit with you to discuss or call her?

## 2020-09-12 ENCOUNTER — Ambulatory Visit (INDEPENDENT_AMBULATORY_CARE_PROVIDER_SITE_OTHER): Payer: BC Managed Care – PPO

## 2020-09-12 ENCOUNTER — Other Ambulatory Visit: Payer: Self-pay

## 2020-09-12 ENCOUNTER — Ambulatory Visit (INDEPENDENT_AMBULATORY_CARE_PROVIDER_SITE_OTHER): Payer: BC Managed Care – PPO | Admitting: Obstetrics & Gynecology

## 2020-09-12 DIAGNOSIS — R102 Pelvic and perineal pain: Secondary | ICD-10-CM

## 2020-09-12 DIAGNOSIS — R3989 Other symptoms and signs involving the genitourinary system: Secondary | ICD-10-CM | POA: Diagnosis not present

## 2020-09-12 NOTE — Progress Notes (Signed)
    Cassandra Gallagher [birth year] [id number]        60 y.o.  [id number]   RP: Intermittent pelvic pain for pelvic ultrasound.  HPI:  Pulling, tugging feeling midline and left side intermittently.  No vaginal bulging seen.  Difficult urination.  Postmenopausal, well on no hormone replacement therapy for > 5 years.  No postmenopausal bleeding.  C/O difficult penetration, obstruction?  Bowel movements normal.   OB History  Gravida Para Term Preterm AB Living  '6 3     3 3  '$ SAB IAB Ectopic Multiple Live Births  3            # Outcome Date GA Lbr Len/2nd Weight Sex Delivery Anes PTL Lv  6 SAB           5 SAB           4 SAB           3 Para           2 Para           1 Para             Past medical history,surgical history, problem list, medications, allergies, family history and social history were all reviewed and documented in the EPIC chart.   Directed ROS with pertinent positives and negatives documented in the history of present illness/assessment and plan.  Exam:  There were no vitals filed for this visit. General appearance:  Normal  Pelvic US today: T/V images.  Anteverted uterus normal in size and shape with no myometrial mass.  The uterus is measured at 4.17 x 3.82 x 3.14 cm.  The endometrial lining is thin and symmetrical with no mass or thickening seen, measured at 2.12 mm.  Both ovaries are atrophic in size with no mass seen.  No adnexal mass seen.  No free fluid in the pelvis.   Assessment/Plan:  60 y.o. [id number]   1. Pelvic pressure in female Pelvic ultrasound thoroughly reviewed with patient.  Uterus, endometrial lining and ovaries completely normal.  No free fluid in the pelvis.  Patient reassured that no gynecologic pathology is present and therefore her pain is probably not gynecologic especially in menopause with inactive ovaries.  2. Bladder pain  Patient describes her intermittent pain as being possible bladder pain.  Decision to refer to urology for  evaluation and investigation.  Princess Bruins MD, 11:47 AM 09/12/2020

## 2020-09-12 NOTE — Telephone Encounter (Signed)
I Checked with Dr. Marguerita Merles because the office here has 2 u/s openings today.  Patient will be coming to the office today at 11:30am per [facility].

## 2020-09-12 NOTE — Telephone Encounter (Signed)
We spoke with patient by phone and she is coming today for u/s visit.

## 2020-09-13 ENCOUNTER — Encounter: Payer: Self-pay | Admitting: Obstetrics & Gynecology

## 2020-09-16 ENCOUNTER — Telehealth: Payer: Self-pay | Admitting: *Deleted

## 2020-09-16 NOTE — Telephone Encounter (Signed)
-----   Message from Princess Bruins, MD sent at 09/12/2020 11:47 AM EDT ----- Regarding: Refer to Urology for Urodynamic testing Intermittent Pelvic pain with Normal Pelvic US.  Patient feels that her pain is related to her bladder.

## 2020-09-16 NOTE — Telephone Encounter (Signed)
Office notes sent to [facility] they will call to schedule.

## 2020-09-17 ENCOUNTER — Telehealth: Payer: Self-pay | Admitting: Hematology and Oncology

## 2020-09-17 NOTE — Telephone Encounter (Signed)
Received a new high risk referral from Dr. Harlow Mares for Breast hypertrophy in female. Ms Cassandra Gallagher has been cld and scheduled to see Dr. Chryl Heck on 8/23 at 1040am. Pt aware to arrive 20 minutes early.

## 2020-10-01 ENCOUNTER — Other Ambulatory Visit: Payer: Self-pay

## 2020-10-01 ENCOUNTER — Encounter: Payer: Self-pay | Admitting: Hematology and Oncology

## 2020-10-01 ENCOUNTER — Inpatient Hospital Stay: Payer: BC Managed Care – PPO | Attending: Hematology and Oncology | Admitting: Hematology and Oncology

## 2020-10-01 VITALS — BP 132/79 | HR 62 | Temp 98.0°F | Resp 17 | Ht 66.5 in | Wt 154.7 lb

## 2020-10-01 DIAGNOSIS — Z170 Estrogen receptor positive status [ER+]: Secondary | ICD-10-CM | POA: Diagnosis not present

## 2020-10-01 DIAGNOSIS — Z9013 Acquired absence of bilateral breasts and nipples: Secondary | ICD-10-CM | POA: Diagnosis not present

## 2020-10-01 DIAGNOSIS — Z87891 Personal history of nicotine dependence: Secondary | ICD-10-CM | POA: Diagnosis not present

## 2020-10-01 DIAGNOSIS — Z803 Family history of malignant neoplasm of breast: Secondary | ICD-10-CM

## 2020-10-01 DIAGNOSIS — C50911 Malignant neoplasm of unspecified site of right female breast: Secondary | ICD-10-CM | POA: Diagnosis present

## 2020-10-01 DIAGNOSIS — Z79811 Long term (current) use of aromatase inhibitors: Secondary | ICD-10-CM | POA: Insufficient documentation

## 2020-10-01 DIAGNOSIS — Z9189 Other specified personal risk factors, not elsewhere classified: Secondary | ICD-10-CM

## 2020-10-01 NOTE — Progress Notes (Signed)
[facility] NOTE  Patient Care Team: Biagio Borg, MD as PCP - General  CHIEF COMPLAINTS/PURPOSE OF CONSULTATION:  [place], initial consultation  ASSESSMENT & PLAN:  This is a pleasant 60 year old female patient with past medical history significant for atypical lobular hyperplasia noted on breast reduction referred to high risk breast clinic for surveillance and recommendations.  We have discussed the following details with the patient today.  Atypical lobular hyperplasia: This is characterized by abnormal cells that are filling part of the lobule.  It appears to increase the risk of breast cancer by 3.7-5.3 fold.  There is risk of both ipsilateral and contralateral breast cancers.  The cumulative incidence of breast cancer is approximately 1 %/year.  Based upon NCI breast cancer risk assessment tool: Life time risk of breast cancer is 21% per TC model. Her previously calculated 5 yr risk of breast cancer is 4.2%. Genetic testing negative for any VUS or pathogenic mutations.  Risk reduction strategies: I recommended appropriate lifestyle and dietary changes that include exercise, eating less red meat and increasing fruits and vegetables and weight loss Risk reduction with tamoxifen or raloxifene was also recommended  Tamoxifen counseling: We discussed the risks and benefits of tamoxifen. These include but not limited to insomnia, hot flashes, mood changes, vaginal dryness, and weight gain. Although rare, serious side effects including endometrial cancer, risk of blood clots were also discussed. We do believe benefits outweigh risks. She wants to think about it for now, she was previously also recommended about considering it. She is not very keen on starting tamoxifen now.  Breast cancer surveillance: Annual mammograms and breast MRI because a lifetime risk of breast cancer risk of more than 20% along with breast exams. MRI ordered today, overdue. Normal breast exam  today SBE recommended at least monthly  She again wondered if there is role for bilateral prophylactic mastectomy.  Have discussed that this is now only recommended in some pathogenic mutations but if she is wishing to pursue this option, she should discuss with the breast surgeon again.  HISTORY OF PRESENTING ILLNESS:  Cassandra Gallagher 60 y.o. female is here because of  [facility] and increased risk of breast cancer. She has seen genetics team a couple yrs ago, had genetic testing which didn't show any pathogenic variants. She apparently met with a surgeon at that time in hope of getting bilateral mastectomies but she was discouraged to do so. Last yr she had breast mammoplasty which showed atrophic breast tissue with some mammary duct ectasia, no atypia in situ or invasive malignancy in the left breast.  Right breast showed an incidental microscopic focus of atypical lobular hyperplasia and atrophic breast tissue, no in situ or invasive malignancy identified.  She tells me that she has significant family history of breast cancer and she knows it can happen soon and she is just very worried about it.  Her mom had breast cancer, her mom sister had breast cancer a few other cousins had breast cancer, paternal grandmother had breast cancer. She is very anxious about the possibility of breast cancer.  She is however not interested in trying tamoxifen prevention at this time.  Rest of the pertinent review of systems reviewed and negative.  REVIEW OF SYSTEMS:   Constitutional: Denies fevers, chills or abnormal night sweats Eyes: Denies blurriness of vision, double vision or watery eyes Ears, nose, mouth, throat, and face: Denies mucositis or sore throat Respiratory: Denies cough, dyspnea or wheezes Cardiovascular: Denies palpitation, chest discomfort  or lower extremity swelling Gastrointestinal:  Denies nausea, heartburn or change in bowel habits Skin: Denies abnormal skin rashes Lymphatics: Denies new  lymphadenopathy or easy bruising Neurological:Denies numbness, tingling or new weaknesses Behavioral/Psych: Mood is stable, no new changes  All other systems were reviewed with the patient and are negative.  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   Cervicalgia 07/26/2007   DEGENERATIVE DISC DISEASE, CERVICAL SPINE 06/07/2009   DEPRESSIVE DISORDER 07/26/2007   Family history of breast cancer    Grave's disease    hx of Grave's Disease/Hyperthyroidism   Hx of migraines    Hyperlipidemia 12/17/2011   Insomnia 06/02/2010   Lumbar degenerative disc disease 01/20/2011   Lump or mass in breast 11/14/2008   Palpitations 09/21/2008   UTI 05/11/2007    SURGICAL HISTORY: Past Surgical History:  Procedure Laterality Date   BREAST BIOPSY Right    BREAST SURGERY Bilateral 01/25/2020   BREAST REDUCTION    CESAREAN SECTION  10/29/2008   twins   DILITATION & CURRETTAGE/HYSTROSCOPY WITH VERSAPOINT RESECTION N/A 07/20/2012   Procedure: DILATATION & CURETTAGE/HYSTEROSCOPY WITH VERSAPOINT RESECTION;  Surgeon: Princess Bruins, MD;  Location: [facility];  Service: Gynecology;  Laterality: N/A;   HYSTEROSCOPY WITH D & C  07-20-12    SOCIAL HISTORY: Social History   Socioeconomic History   Marital status: Married    Spouse name: Not on file   Number of children: 2   Years of education: Not on file   Highest education level: Not on file  Occupational History   Occupation: Engineer, building services: [employer]  Tobacco Use   Smoking status: Former    Types: Cigarettes    Quit date: 11/24/1996    Years since quitting: 23.8   Smokeless tobacco: Never   Tobacco comments:    smoked for 6 years, quit 15 years ago  Vaping Use   Vaping Use: Never used  Substance and Sexual Activity   Alcohol use: No   Drug use: No   Sexual activity: Yes    Birth control/protection: Post-menopausal     Comment: married- 68 yrs   Other Topics Concern   Not on file  Social History Narrative   2nd child born recently in September 2010.   Married, one child from previous marriage lives in [place]   Social Determinants of Health   Financial Resource Strain: Not on file  Food Insecurity: Not on file  Transportation Needs: Not on file  Physical Activity: Not on file  Stress: Not on file  Social Connections: Not on file  Intimate Partner Violence: Not on file    FAMILY HISTORY: Family History  Problem Relation Age of Onset   Hypertension Mother    Breast cancer Mother 58   Cancer Father        lung   Cancer Brother        lung    Breast cancer Maternal Aunt 58   Breast cancer Paternal Grandmother    Breast cancer Maternal Grandmother     ALLERGIES:  has No Known Allergies.  MEDICATIONS:  Current Outpatient Medications  Medication Sig Dispense Refill   aspirin 325 MG tablet Take 325 mg by mouth as needed.     clonazePAM (KLONOPIN) 0.5 MG tablet TAKE ONE TABLET BY MOUTH TWICE DAILY AS NEEDED 60 tablet 0   Fremanezumab-vfrm 225 MG/1.5ML SOSY Inject 3 Syringes into the  skin every 3 (three) months.      HYDROcodone-acetaminophen (NORCO/VICODIN) 5-325 MG tablet Take 1 tablet by mouth every 6 (six) hours as needed. (Patient taking differently: Take 1.5 tablets by mouth daily.) 10 tablet 0   RIZATRIPTAN BENZOATE PO Take 1 tablet by mouth daily as needed (migraines).     ZOMIG 2.5 MG SOLN Place 1 spray into both nostrils daily as needed (migraines).      No current facility-administered medications for this visit.    PHYSICAL EXAMINATION: ECOG PERFORMANCE STATUS: 0 - Asymptomatic  Vitals:   10/01/20 1046  BP: 132/79  Pulse: 62  Resp: 17  Temp: 98 F (36.7 C)  SpO2: 96%   Filed Weights   10/01/20 1046  Weight: 154 lb 11.2 oz (70.2 kg)    GENERAL:alert, no distress and comfortable Breasts: Bilateral breasts inspected and palpated.  No palpable masses or regional  adenopathy.  Skin and nipple looks healthy.  LABORATORY DATA:  I have reviewed the data as listed Lab Results  Component Value Date   WBC 5.7 06/30/2019   HGB 13.3 06/30/2019   HCT 39.6 06/30/2019   MCV 93.7 06/30/2019   PLT 259.0 06/30/2019     Chemistry      Component Value Date/Time   NA 139 06/30/2019 1055   K 3.7 06/30/2019 1055   CL 106 06/30/2019 1055   CO2 30 06/30/2019 1055   BUN 14 06/30/2019 1055   CREATININE 0.63 06/30/2019 1055      Component Value Date/Time   CALCIUM 9.9 06/30/2019 1055   ALKPHOS 66 06/30/2019 1055   AST 17 06/30/2019 1055   ALT 21 06/30/2019 1055   BILITOT 0.3 06/30/2019 1055       RADIOGRAPHIC STUDIES: I have personally reviewed the radiological images as listed and agreed with the findings in the report. US Transvaginal Non-OB  Result Date: 09/23/2020  T/V images.  Anteverted uterus normal in size and shape with no myometrial mass.  The uterus is measured at 4.17 x 3.82 x 3.14 cm.  The endometrial lining is thin and symmetrical with no mass or thickening seen, measured at 2.12 mm.  Both ovaries are atrophic in size with no mass seen.  No adnexal mass seen.  No free fluid in the pelvis.   All questions were answered. The patient knows to call the clinic with any problems, questions or concerns. I spent 60 minutes in the care of this patient including H and P, review of records, counseling and coordination of care. We discussed about her 5-year risk of breast cancer, lifetime risk of breast cancer, breast cancer risk reduction strategies, lifestyle interventions, role of MRI surveillance, chemoprevention and follow-up recommendations.    Benay Pike, MD 10/01/2020 11:04 AM

## 2020-10-03 ENCOUNTER — Other Ambulatory Visit: Payer: BC Managed Care – PPO | Admitting: Obstetrics & Gynecology

## 2020-10-03 ENCOUNTER — Other Ambulatory Visit: Payer: BC Managed Care – PPO

## 2020-10-09 ENCOUNTER — Other Ambulatory Visit: Payer: Self-pay | Admitting: Internal Medicine

## 2020-10-09 NOTE — Telephone Encounter (Signed)
Klonopin refilled x 1 mo  Please to let pt know - last refill  - needs ROV for further refills

## 2020-10-22 NOTE — Telephone Encounter (Signed)
Patient scheduled on 11/01/20 with Dr.Newsome

## 2020-11-13 ENCOUNTER — Other Ambulatory Visit: Payer: Self-pay | Admitting: Internal Medicine

## 2020-11-13 NOTE — Telephone Encounter (Signed)
Ok to call pt - I was unable to further refill the klonopin due to office refill policy  Please make ROV for further refills

## 2020-11-14 ENCOUNTER — Other Ambulatory Visit: Payer: Self-pay

## 2020-11-14 ENCOUNTER — Ambulatory Visit: Payer: BC Managed Care – PPO | Admitting: Internal Medicine

## 2020-11-14 ENCOUNTER — Encounter: Payer: Self-pay | Admitting: Internal Medicine

## 2020-11-14 VITALS — BP 112/74 | HR 73 | Temp 98.7°F | Ht 66.5 in | Wt 153.0 lb

## 2020-11-14 DIAGNOSIS — Z0000 Encounter for general adult medical examination without abnormal findings: Secondary | ICD-10-CM

## 2020-11-14 DIAGNOSIS — E538 Deficiency of other specified B group vitamins: Secondary | ICD-10-CM | POA: Diagnosis not present

## 2020-11-14 DIAGNOSIS — E559 Vitamin D deficiency, unspecified: Secondary | ICD-10-CM

## 2020-11-14 DIAGNOSIS — E785 Hyperlipidemia, unspecified: Secondary | ICD-10-CM

## 2020-11-14 DIAGNOSIS — Z23 Encounter for immunization: Secondary | ICD-10-CM

## 2020-11-14 DIAGNOSIS — R739 Hyperglycemia, unspecified: Secondary | ICD-10-CM | POA: Diagnosis not present

## 2020-11-14 MED ORDER — CLONAZEPAM 0.5 MG PO TABS
0.5000 mg | ORAL_TABLET | Freq: Two times a day (BID) | ORAL | 2 refills | Status: DC | PRN
Start: 2020-11-14 — End: 2021-02-28

## 2020-11-14 NOTE — Patient Instructions (Addendum)
Please consider the shingles shot if covered by your insurance  Please follow up with the high risk breast cancer clinic with mammogram  We have discussed the Cardiac CT Score test to measure the calcification level (if any) in your heart arteries.  This test has been ordered in our [facility], so please call  CT directly, as they prefer this, at [phone] to be scheduled.  Please continue all other medications as before, and refills have been done if requested.  Please have the pharmacy call with any other refills you may need.  Please continue your efforts at being more active, low cholesterol diet, and weight control.  You are otherwise up to date with prevention measures today.  Please keep your appointments with your specialists as you may have planned  Please go to the LAB at the blood drawing area for the tests to be done  You will be contacted by phone if any changes need to be made immediately.  Otherwise, you will receive a letter about your results with an explanation, but please check with MyChart first.  Please make an Appointment to return for your 1 year visit, or sooner if needed

## 2020-11-16 ENCOUNTER — Encounter: Payer: Self-pay | Admitting: Internal Medicine

## 2020-11-16 NOTE — Assessment & Plan Note (Signed)
Lab Results  Component Value Date   LDLCALC 136 (H) 06/30/2019   Stable, pt to continue current low chol diet, declines statin, but ok for card ct score

## 2020-11-16 NOTE — Assessment & Plan Note (Signed)
Lab Results  Component Value Date   HGBA1C 5.7 06/30/2019   Stable, pt to continue current medical treatment  - diet

## 2020-11-16 NOTE — Assessment & Plan Note (Signed)
Age and sex appropriate education and counseling updated with regular exercise and diet Referrals for preventative services - declines mammogram Immunizations addressed - declines covid booster, shingrix Smoking counseling  - none needed Evidence for depression or other mood disorder - no worsening anxiety depressive symtpoms recenlty Most recent labs reviewed. I have personally reviewed and have noted: 1) the patient's medical and social history 2) The patient's current medications and supplements 3) The patient's height, weight, and BMI have been recorded in the chart

## 2020-11-16 NOTE — Progress Notes (Signed)
Patient ID: Cassandra Gallagher, female   DOB: 02-09-1961, 60 y.o.   MRN: [id number]         Chief Complaint:: wellness exam and Follow-up Hld, hyperglycemia       HPI:  Cassandra Gallagher is a 60 y.o. female here for wellness exam; declines covid booster, shingrix, mammogram.  Pt denies chest pain, increased sob or doe, wheezing, orthopnea, PND, increased LE swelling, palpitations, dizziness or syncope.   Pt denies polydipsia, polyuria, or new focal neuro s/s.  Trying to follow lower chol diet.  Is interested in Ct Card score.  No other new complaints   Wt Readings from Last 3 Encounters:  11/14/20 153 lb (69.4 kg)  10/01/20 154 lb 11.2 oz (70.2 kg)  06/24/20 156 lb 12.8 oz (71.1 kg)   BP Readings from Last 3 Encounters:  11/14/20 112/74  10/01/20 132/79  08/29/20 120/78   Immunization History  Administered Date(s) Administered   Influenza Split 10/26/2011   Influenza,inj,Quad PF,6+ Mos 12/21/2017, 12/13/2018, 11/14/2020   PFIZER(Purple Top)SARS-COV-2 Vaccination 04/17/2019, 05/08/2019   Pneumococcal Conjugate-13 04/25/2018   Tdap 10/26/2011  There are no preventive care reminders to display for this patient.    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   Cervicalgia 07/26/2007   DEGENERATIVE DISC DISEASE, CERVICAL SPINE 06/07/2009   DEPRESSIVE DISORDER 07/26/2007   Family history of breast cancer    Grave's disease    hx of Grave's Disease/Hyperthyroidism   Hx of migraines    Hyperlipidemia 12/17/2011   Insomnia 06/02/2010   Lumbar degenerative disc disease 01/20/2011   Lump or mass in breast 11/14/2008   Palpitations 09/21/2008   UTI 05/11/2007   Past Surgical History:  Procedure Laterality Date   BREAST BIOPSY Right    BREAST SURGERY Bilateral 01/25/2020   BREAST REDUCTION    CESAREAN SECTION  10/29/2008   twins   DILITATION & CURRETTAGE/HYSTROSCOPY WITH VERSAPOINT RESECTION N/A 07/20/2012    Procedure: DILATATION & CURETTAGE/HYSTEROSCOPY WITH VERSAPOINT RESECTION;  Surgeon: Princess Bruins, MD;  Location: [facility];  Service: Gynecology;  Laterality: N/A;   HYSTEROSCOPY WITH D & C  07-20-12    reports that she quit smoking about 23 years ago. Her smoking use included cigarettes. She has never used smokeless tobacco. She reports that she does not drink alcohol and does not use drugs. family history includes Breast cancer in her maternal grandmother and paternal grandmother; Breast cancer (age of onset: 49) in her maternal aunt; Breast cancer (age of onset: 71) in her mother; Cancer in her brother and father; Hypertension in her mother. No Known Allergies Current Outpatient Medications on File Prior to Visit  Medication Sig Dispense Refill   aspirin 325 MG tablet Take 325 mg by mouth as needed.     Fremanezumab-vfrm 225 MG/1.5ML SOSY Inject 3 Syringes into the skin every 3 (three) months.      HYDROcodone-acetaminophen (NORCO/VICODIN) 5-325 MG tablet Take 1 tablet by mouth every 6 (six) hours as needed. (Patient taking differently: Take 1.5 tablets by mouth daily.) 10 tablet 0   RIZATRIPTAN BENZOATE PO Take 1 tablet by mouth daily as needed (migraines).     ZOMIG 2.5 MG SOLN Place 1 spray into both nostrils daily as needed (migraines).      No current facility-administered medications on file prior to visit.        ROS:  All others reviewed and negative.  Objective  PE:  BP 112/74 (BP Location: Right Arm, Patient Position: Sitting, Cuff Size: Normal)   Pulse 73   Temp 98.7 F (37.1 C) (Oral)   Ht 5' 6.5" (1.689 m)   Wt 153 lb (69.4 kg)   LMP 04/04/2012   SpO2 98%   BMI 24.32 kg/m                 Constitutional: Pt appears in NAD               HENT: Head: NCAT.                Right Ear: External ear normal.                 Left Ear: External ear normal.                Eyes: . Pupils are equal, round, and reactive to light. Conjunctivae and EOM are normal                Nose: without d/c or deformity               Neck: Neck supple. Gross normal ROM               Cardiovascular: Normal rate and regular rhythm.                 Pulmonary/Chest: Effort normal and breath sounds without rales or wheezing.                Abd:  Soft, NT, ND, + BS, no organomegaly               Neurological: Pt is alert. At baseline orientation, motor grossly intact               Skin: Skin is warm. No rashes, no other new lesions, LE edema - none               Psychiatric: Pt behavior is normal without agitation   Micro: none  Cardiac tracings I have personally interpreted today:  none  Pertinent Radiological findings (summarize): none   Lab Results  Component Value Date   WBC 5.7 06/30/2019   HGB 13.3 06/30/2019   HCT 39.6 06/30/2019   PLT 259.0 06/30/2019   GLUCOSE 105 (H) 06/30/2019   CHOL 202 (H) 06/30/2019   TRIG 129.0 06/30/2019   HDL 40.50 06/30/2019   LDLDIRECT 186.0 02/22/2018   LDLCALC 136 (H) 06/30/2019   ALT 21 06/30/2019   AST 17 06/30/2019   NA 139 06/30/2019   K 3.7 06/30/2019   CL 106 06/30/2019   CREATININE 0.63 06/30/2019   BUN 14 06/30/2019   CO2 30 06/30/2019   TSH 0.73 06/30/2019   HGBA1C 5.7 06/30/2019   Assessment/Plan:  Cassandra Gallagher is a 61 y.o. White or Caucasian [1] female with  has a past medical history of Anxiety (06/02/2010), BACK PAIN (07/26/2007), Cervical spine degeneration (01/20/2011), Cervicalgia (07/26/2007), DEGENERATIVE DISC DISEASE, CERVICAL SPINE (06/07/2009), DEPRESSIVE DISORDER (07/26/2007), Family history of breast cancer, Grave's disease, migraines, Hyperlipidemia (12/17/2011), Insomnia (06/02/2010), Lumbar degenerative disc disease (01/20/2011), Lump or mass in breast (11/14/2008), Palpitations (09/21/2008), and UTI (05/11/2007).  Preventative health care Age and sex appropriate education and counseling updated with regular exercise and diet Referrals for preventative services - declines mammogram Immunizations  addressed - declines covid booster, shingrix Smoking counseling  - none needed Evidence for depression or other mood disorder - no worsening anxiety depressive symtpoms recenlty  Most recent labs reviewed. I have personally reviewed and have noted: 1) the patient's medical and social history 2) The patient's current medications and supplements 3) The patient's height, weight, and BMI have been recorded in the chart   Hyperlipidemia Lab Results  Component Value Date   LDLCALC 136 (H) 06/30/2019   Stable, pt to continue current low chol diet, declines statin, but ok for card ct score   Hyperglycemia Lab Results  Component Value Date   HGBA1C 5.7 06/30/2019   Stable, pt to continue current medical treatment  - diet  Followup: Return in about 1 year (around 11/14/2021).  Cathlean Cower, MD 11/16/2020 7:53 PM [facility]

## 2020-11-25 ENCOUNTER — Other Ambulatory Visit: Payer: Self-pay | Admitting: Internal Medicine

## 2020-11-25 DIAGNOSIS — Z1231 Encounter for screening mammogram for malignant neoplasm of breast: Secondary | ICD-10-CM

## 2020-11-26 ENCOUNTER — Ambulatory Visit
Admission: RE | Admit: 2020-11-26 | Discharge: 2020-11-26 | Disposition: A | Discharge status: Home / Self Care | Payer: BC Managed Care – PPO | Source: Ambulatory Visit | Attending: Internal Medicine | Admitting: Internal Medicine

## 2020-11-26 ENCOUNTER — Other Ambulatory Visit: Payer: Self-pay

## 2020-11-26 DIAGNOSIS — Z1231 Encounter for screening mammogram for malignant neoplasm of breast: Secondary | ICD-10-CM

## 2021-02-28 ENCOUNTER — Other Ambulatory Visit: Payer: Self-pay | Admitting: Internal Medicine

## 2021-04-03 ENCOUNTER — Other Ambulatory Visit: Payer: Self-pay

## 2021-04-03 ENCOUNTER — Inpatient Hospital Stay: Payer: BC Managed Care – PPO | Attending: Hematology and Oncology | Admitting: Hematology and Oncology

## 2021-04-03 ENCOUNTER — Encounter: Payer: Self-pay | Admitting: Hematology and Oncology

## 2021-04-03 VITALS — BP 123/71 | HR 76 | Temp 97.9°F | Wt 155.6 lb

## 2021-04-03 DIAGNOSIS — Z9189 Other specified personal risk factors, not elsewhere classified: Secondary | ICD-10-CM

## 2021-04-03 DIAGNOSIS — N6092 Unspecified benign mammary dysplasia of left breast: Secondary | ICD-10-CM | POA: Diagnosis not present

## 2021-04-03 DIAGNOSIS — N6099 Unspecified benign mammary dysplasia of unspecified breast: Secondary | ICD-10-CM | POA: Insufficient documentation

## 2021-04-03 DIAGNOSIS — Z87891 Personal history of nicotine dependence: Secondary | ICD-10-CM | POA: Diagnosis not present

## 2021-04-03 NOTE — Progress Notes (Signed)
[facility] NOTE  Patient Care Team: Biagio Borg, MD as PCP - General  CHIEF COMPLAINTS/PURPOSE OF CONSULTATION:  [place], follow-up  ASSESSMENT & PLAN:   This is a pleasant 61 year old female patient with past medical history significant for atypical lobular hyperplasia noted on breast reduction referred to high risk breast clinic for surveillance and recommendations.   Based upon NCI breast cancer risk assessment tool: Life time risk of breast cancer is 21% per TC model. Her previously calculated 5 yr risk of breast cancer is 4.2%. Genetic testing negative for any VUS or pathogenic mutations. During her last visit we have discussed about risk reduction strategies including lifestyle changes, dietary changes, role of tamoxifen counseling, breast cancer surveillance with annual mammograms and breast MRIs.  She is here for follow-up. She is very worried about her risk of breast cancer, had mammogram in Oct, will be scheduling MRI soon She would like to talk to breast surgery team again.  She says she did not have a good experience with the last breast surgery discussion and she would like to see a different breast surgeon.  She has been reviewing articles and meta-analysis which suggest that if someone with ADH and ALH have significant family history, they should be considered for other risk reduction strategies and she was hoping to talk about this with the breast surgery team. I have encouraged her to try the antiestrogen therapy be tamoxifen or aromatase inhibitor since this will also reduce her risk of breast cancer but she is quite reluctant since her mom had a lot of side effects with tamoxifen. I have sent a message to our nurse navigators to arrange for follow-up with breast surgery to discuss her concerns once again.  She otherwise will set up for MRI as recommended and will think about trying antiestrogen therapy and will return to clinic with [place] in 6 months or  sooner.  11/26/2020, mammogram showed no findings suspicious for malignancy.  HISTORY OF PRESENTING ILLNESS:   Cassandra Gallagher 61 y.o. female is here because of  [facility] and increased risk of breast cancer.  She is here for follow up.  She has been reading some research articles and is wondering if there is role for bilateral mastectomy, because she has FH of breast cancer in mother, maternal aunt. She has been reading a lot of literature and recently reviewed a meta-analysis which suggested more risk reduction strategies especially if someone with [facility] or ALH have significant family history of breast cancer.  She tells me that her mom had breast cancer twice, her maternal aunt had breast cancer twice and she is just worried that she is waiting for the breast cancer to happen. She is hoping to talk to breast surgery team again to discuss any additional recommendations. She had mammogram in Oct 2022 negative for breast cancer. Breast density B She is thinking about tamoxifen, she is worried that her mom had bad side effects from tamoxifen.  She also is worried to add new medication because she has tried very hard to get rid of medication for her migraines. She is not quite keen to try aromatase inhibitors either at this time. 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   Cervicalgia 07/26/2007   DEGENERATIVE DISC DISEASE, CERVICAL SPINE 06/07/2009   DEPRESSIVE DISORDER 07/26/2007   Family  history of breast cancer    Grave's disease    hx of Grave's Disease/Hyperthyroidism   Hx of migraines    Hyperlipidemia 12/17/2011   Insomnia 06/02/2010   Lumbar degenerative disc disease 01/20/2011   Lump or mass in breast 11/14/2008   Palpitations 09/21/2008   UTI 05/11/2007    SURGICAL HISTORY: Past Surgical History:  Procedure Laterality Date    BREAST BIOPSY Right    BREAST SURGERY Bilateral 01/25/2020   BREAST REDUCTION    CESAREAN SECTION  10/29/2008   twins   DILITATION & CURRETTAGE/HYSTROSCOPY WITH VERSAPOINT RESECTION N/A 07/20/2012   Procedure: DILATATION & CURETTAGE/HYSTEROSCOPY WITH VERSAPOINT RESECTION;  Surgeon: Princess Bruins, MD;  Location: [facility];  Service: Gynecology;  Laterality: N/A;   HYSTEROSCOPY WITH D & C  07/20/2012   REDUCTION MAMMAPLASTY      SOCIAL HISTORY: Social History   Socioeconomic History   Marital status: Married    Spouse name: Not on file   Number of children: 2   Years of education: Not on file   Highest education level: Not on file  Occupational History   Occupation: Engineer, building services: [employer]  Tobacco Use   Smoking status: Former    Types: Cigarettes    Quit date: 11/24/1996    Years since quitting: 24.3   Smokeless tobacco: Never   Tobacco comments:    smoked for 6 years, quit 15 years ago  Vaping Use   Vaping Use: Never used  Substance and Sexual Activity   Alcohol use: No   Drug use: No   Sexual activity: Yes    Birth control/protection: Post-menopausal    Comment: married- 52 yrs   Other Topics Concern   Not on file  Social History Narrative   2nd child born recently in September 2010.   Married, one child from previous marriage lives in [place]   Social Determinants of Health   Financial Resource Strain: Not on file  Food Insecurity: Not on file  Transportation Needs: Not on file  Physical Activity: Not on file  Stress: Not on file  Social Connections: Not on file  Intimate Partner Violence: Not on file    FAMILY HISTORY: Family History  Problem Relation Age of Onset   Hypertension Mother    Breast cancer Mother 17   Cancer Father        lung   Cancer Brother        lung    Breast cancer Maternal Aunt 58   Breast cancer Paternal Grandmother    Breast cancer Maternal Grandmother     ALLERGIES:  has No Known  Allergies.  MEDICATIONS:  Current Outpatient Medications  Medication Sig Dispense Refill   aspirin 325 MG tablet Take 325 mg by mouth as needed.     clonazePAM (KLONOPIN) 0.5 MG tablet Take 1 tablet (0.5 mg total) by mouth 2 (two) times daily as needed. 60 tablet 2   Fremanezumab-vfrm 225 MG/1.5ML SOSY Inject 3 Syringes into the skin every 3 (three) months.      HYDROcodone-acetaminophen (NORCO/VICODIN) 5-325 MG tablet Take 1 tablet by mouth every 6 (six) hours as needed. (Patient taking differently: Take 1.5 tablets by mouth daily.) 10 tablet 0   RIZATRIPTAN BENZOATE PO Take 1 tablet by mouth daily as needed (migraines).     ZOMIG 2.5 MG SOLN Place 1 spray into both nostrils daily as needed (migraines).      No current facility-administered medications for this visit.  PHYSICAL EXAMINATION: ECOG PERFORMANCE STATUS: 0 - Asymptomatic  Vitals:   04/03/21 0940  BP: 123/71  Pulse: 76  Temp: 97.9 F (36.6 C)  SpO2: 99%    Filed Weights   04/03/21 0940  Weight: 155 lb 9.6 oz (70.6 kg)    Physical Exam HENT:     Head: Normocephalic and atraumatic.  Chest:     Comments: Bilateral breasts inspected.  No concerns.  On palpation no masses or regional adenopathy. Abdominal:     General: Abdomen is flat.  Musculoskeletal:        General: No swelling.     Cervical back: Normal range of motion and neck supple. No rigidity.  Lymphadenopathy:     Cervical: No cervical adenopathy.  Skin:    General: Skin is warm and dry.  Neurological:     General: No focal deficit present.    LABORATORY DATA:  I have reviewed the data as listed Lab Results  Component Value Date   WBC 5.7 06/30/2019   HGB 13.3 06/30/2019   HCT 39.6 06/30/2019   MCV 93.7 06/30/2019   PLT 259.0 06/30/2019     Chemistry      Component Value Date/Time   NA 139 06/30/2019 1055   K 3.7 06/30/2019 1055   CL 106 06/30/2019 1055   CO2 30 06/30/2019 1055   BUN 14 06/30/2019 1055   CREATININE 0.63 06/30/2019  1055      Component Value Date/Time   CALCIUM 9.9 06/30/2019 1055   ALKPHOS 66 06/30/2019 1055   AST 17 06/30/2019 1055   ALT 21 06/30/2019 1055   BILITOT 0.3 06/30/2019 1055       RADIOGRAPHIC STUDIES: I have personally reviewed the radiological images as listed and agreed with the findings in the report. No results found.  All questions were answered. The patient knows to call the clinic with any problems, questions or concerns. I spent 30 minutes in the care of this patient including H and P, review of records, counseling and coordination of care. We have once again discussed about risk reduction strategies for patients with atypical lobular hyperplasia.  We have discussed that at this time there is no role for bilateral mastectomy.  We have discussed once again about role of antiestrogen therapy in reducing risk of breast cancer as well as a surveillance with MRIs and mammograms.    Cassandra Pike, MD 04/03/2021 9:55 AM

## 2021-05-07 DIAGNOSIS — N6099 Unspecified benign mammary dysplasia of unspecified breast: Secondary | ICD-10-CM | POA: Insufficient documentation

## 2021-05-23 ENCOUNTER — Other Ambulatory Visit: Payer: BC Managed Care – PPO

## 2021-05-26 ENCOUNTER — Other Ambulatory Visit: Payer: Self-pay | Admitting: Internal Medicine

## 2021-05-28 ENCOUNTER — Other Ambulatory Visit: Payer: BC Managed Care – PPO

## 2021-05-30 ENCOUNTER — Encounter: Payer: Self-pay | Admitting: Hematology and Oncology

## 2021-06-02 ENCOUNTER — Ambulatory Visit
Admission: RE | Admit: 2021-06-02 | Discharge: 2021-06-02 | Disposition: A | Discharge status: Home / Self Care | Payer: BC Managed Care – PPO | Source: Ambulatory Visit | Attending: Hematology and Oncology | Admitting: Hematology and Oncology

## 2021-06-02 DIAGNOSIS — Z9189 Other specified personal risk factors, not elsewhere classified: Secondary | ICD-10-CM

## 2021-06-02 MED ORDER — GADOBUTROL 1 MMOL/ML IV SOLN
7.0000 mL | Freq: Once | INTRAVENOUS | Status: AC | PRN
  Administered 2021-06-02: 7 mL via INTRAVENOUS

## 2021-07-02 ENCOUNTER — Encounter: Payer: Self-pay | Admitting: Internal Medicine

## 2021-07-10 MED ORDER — CLONAZEPAM 0.5 MG PO TABS: 0.5000 mg | ORAL_TABLET | Freq: Two times a day (BID) | ORAL | 2 refills | Status: DC | PRN

## 2021-09-12 ENCOUNTER — Other Ambulatory Visit: Payer: Self-pay | Admitting: Specialist

## 2021-09-12 DIAGNOSIS — M5412 Radiculopathy, cervical region: Secondary | ICD-10-CM

## 2021-09-20 ENCOUNTER — Ambulatory Visit
Admission: RE | Admit: 2021-09-20 | Discharge: 2021-09-20 | Disposition: A | Discharge status: Home / Self Care | Payer: BC Managed Care – PPO | Source: Ambulatory Visit | Attending: Specialist | Admitting: Specialist

## 2021-09-20 DIAGNOSIS — M5412 Radiculopathy, cervical region: Secondary | ICD-10-CM

## 2021-10-01 ENCOUNTER — Encounter: Payer: Self-pay | Admitting: Hematology and Oncology

## 2021-10-01 ENCOUNTER — Inpatient Hospital Stay: Payer: BC Managed Care – PPO | Attending: Hematology and Oncology | Admitting: Hematology and Oncology

## 2021-10-01 ENCOUNTER — Other Ambulatory Visit: Payer: Self-pay

## 2021-10-01 VITALS — BP 122/83 | HR 66 | Temp 97.0°F | Resp 16 | Ht 66.0 in | Wt 155.9 lb

## 2021-10-01 DIAGNOSIS — Z87891 Personal history of nicotine dependence: Secondary | ICD-10-CM | POA: Insufficient documentation

## 2021-10-01 DIAGNOSIS — N62 Hypertrophy of breast: Secondary | ICD-10-CM | POA: Diagnosis present

## 2021-10-01 DIAGNOSIS — Z803 Family history of malignant neoplasm of breast: Secondary | ICD-10-CM | POA: Diagnosis not present

## 2021-10-01 DIAGNOSIS — Z9189 Other specified personal risk factors, not elsewhere classified: Secondary | ICD-10-CM

## 2021-10-01 NOTE — Progress Notes (Addendum)
[facility] NOTE  Patient Care Team: Biagio Borg, MD as PCP - General  CHIEF COMPLAINTS/PURPOSE OF CONSULTATION:  [name], follow-up  ASSESSMENT & PLAN:   This is a pleasant 61 year old female patient with past medical history significant for atypical lobular hyperplasia noted on breast reduction referred to high risk breast clinic for surveillance and recommendations.   Based upon NCI breast cancer risk assessment tool: Life time risk of breast cancer is 21% per TC model. Her previously calculated 5 yr risk of breast cancer is 4.2%. Genetic testing negative for any VUS or pathogenic mutations. During her last visit, she requested another referral to breast surgery team to discuss about prophylactic mastectomy. Most recent MRI breast imaging from April 2023 with no evidence of malignancy within either breast. She does not want to proceed with antiestrogen therapy for breast cancer prevention.  She tells me that she reacts to medications and would rather continue surveillance with MRI alternating with mammograms.  With regards to the MRI, she had a question about the parenchymal enhancement that was mentioned on the most recent imaging as moderate whereas it was in the past described as mild.  She read that this could be increasing her risk of breast cancer and was wondering if I can reach out to the radiologist.  I have sent an in basket message to Dr. Enriqueta Shutter to see if this change is pertinent.  She also met Dr. Marlou Starks and discussed about bilateral mastectomy but she is not ready for this at this time.  She is hoping to consider this in the future if there are any abnormalities on her imaging. All her questions were answered to the best my knowledge.  Thank you for consulting [place] the care of this patient.  Please do not hesitate contact us with any additional questions or concerns.   Addendum:  Per Radiology:  The background parenchymal enhancement on Breast MRIs can vary  from exam to exam.  It often varies between mild and moderate, as in this case.  It does not effect her BC risk.  This exam was a very good exam and the background enhancement did not limit the evaluation in any way.  HISTORY OF PRESENTING ILLNESS:   Cassandra Gallagher 61 y.o. female is here because of  [facility] and increased risk of breast cancer.  She is here for follow up.  Since last visit, she had an MRI.  She had a question about the parenchymal enhancement.  She also gave a good thought about antiestrogen therapy for breast cancer prevention and she does not want to proceed with this at this time.  She would rather continue surveillance.  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   Cervicalgia 07/26/2007   DEGENERATIVE DISC DISEASE, CERVICAL SPINE 06/07/2009   DEPRESSIVE DISORDER 07/26/2007   Family history of breast cancer    Grave's disease    hx of Grave's Disease/Hyperthyroidism   Hx of migraines    Hyperlipidemia 12/17/2011   Insomnia 06/02/2010   Lumbar degenerative disc disease 01/20/2011   Lump or mass in breast 11/14/2008   Palpitations 09/21/2008   UTI 05/11/2007    SURGICAL HISTORY: Past Surgical History:  Procedure Laterality Date   BREAST BIOPSY Right    BREAST SURGERY Bilateral 01/25/2020   BREAST REDUCTION    CESAREAN SECTION  10/29/2008   twins   DILITATION & CURRETTAGE/HYSTROSCOPY WITH VERSAPOINT RESECTION N/A 07/20/2012   Procedure: DILATATION & CURETTAGE/HYSTEROSCOPY WITH VERSAPOINT RESECTION;  Surgeon: Princess Bruins, MD;  Location: [facility];  Service: Gynecology;  Laterality: N/A;   HYSTEROSCOPY WITH D & C  07/20/2012   REDUCTION MAMMAPLASTY      SOCIAL HISTORY: Social History   Socioeconomic History   Marital status: Married    Spouse name: Not on file   Number of children: 2   Years of  education: Not on file   Highest education level: Not on file  Occupational History   Occupation: Engineer, building services: [employer]  Tobacco Use   Smoking status: Former    Types: Cigarettes    Quit date: 11/24/1996    Years since quitting: 24.8   Smokeless tobacco: Never   Tobacco comments:    smoked for 6 years, quit 15 years ago  Vaping Use   Vaping Use: Never used  Substance and Sexual Activity   Alcohol use: No   Drug use: No   Sexual activity: Yes    Birth control/protection: Post-menopausal    Comment: married- 39 yrs   Other Topics Concern   Not on file  Social History Narrative   2nd child born recently in September 2010.   Married, one child from previous marriage lives in [place]   Social Determinants of Health   Financial Resource Strain: Not on file  Food Insecurity: Not on file  Transportation Needs: Not on file  Physical Activity: Not on file  Stress: Not on file  Social Connections: Not on file  Intimate Partner Violence: Not on file    FAMILY HISTORY: Family History  Problem Relation Age of Onset   Hypertension Mother    Breast cancer Mother 14   Cancer Father        lung   Cancer Brother        lung    Breast cancer Maternal Aunt 58   Breast cancer Paternal Grandmother    Breast cancer Maternal Grandmother     ALLERGIES:  has No Known Allergies.  MEDICATIONS:  Current Outpatient Medications  Medication Sig Dispense Refill   aspirin 325 MG tablet Take 325 mg by mouth as needed.     clonazePAM (KLONOPIN) 0.5 MG tablet Take 1 tablet (0.5 mg total) by mouth 2 (two) times daily as needed. 60 tablet 2   Fremanezumab-vfrm 225 MG/1.5ML SOSY Inject 3 Syringes into the skin every 3 (three) months.      HYDROcodone-acetaminophen (NORCO/VICODIN) 5-325 MG tablet Take 1 tablet by mouth every 6 (six) hours as needed. (Patient taking differently: Take 1.5 tablets by mouth daily.) 10 tablet 0   RIZATRIPTAN BENZOATE PO Take 1 tablet by mouth  daily as needed (migraines).     ZOMIG 2.5 MG SOLN Place 1 spray into both nostrils daily as needed (migraines).      No current facility-administered medications for this visit.    PHYSICAL EXAMINATION: ECOG PERFORMANCE STATUS: 0 - Asymptomatic  There were no vitals filed for this visit.   There were no vitals filed for this visit.   Physical Exam HENT:     Head: Normocephalic and atraumatic.  Chest:     Comments: Bilateral breasts inspected.  No concerns.  On palpation no masses or regional adenopathy. Musculoskeletal:        General: No swelling.     Cervical back: Normal range of motion and neck supple. No rigidity.  Lymphadenopathy:     Cervical: No cervical adenopathy.  Neurological:     General: No focal deficit present.     LABORATORY DATA:  I have reviewed the data as listed Lab Results  Component Value Date   WBC 5.7 06/30/2019   HGB 13.3 06/30/2019   HCT 39.6 06/30/2019   MCV 93.7 06/30/2019   PLT 259.0 06/30/2019     Chemistry      Component Value Date/Time   NA 139 06/30/2019 1055   K 3.7 06/30/2019 1055   CL 106 06/30/2019 1055   CO2 30 06/30/2019 1055   BUN 14 06/30/2019 1055   CREATININE 0.63 06/30/2019 1055      Component Value Date/Time   CALCIUM 9.9 06/30/2019 1055   ALKPHOS 66 06/30/2019 1055   AST 17 06/30/2019 1055   ALT 21 06/30/2019 1055   BILITOT 0.3 06/30/2019 1055       RADIOGRAPHIC STUDIES: I have personally reviewed the radiological images as listed and agreed with the findings in the report. MR CERVICAL SPINE WO CONTRAST  Result Date: 09/22/2021 CLINICAL DATA:  Cervical pain for 12 years. Radiates down bilateral sides. EXAM: MRI CERVICAL SPINE WITHOUT CONTRAST TECHNIQUE: Multiplanar, multisequence MR imaging of the cervical spine was performed. No intravenous contrast was administered. COMPARISON:  None Available. FINDINGS: Alignment: 2 mm anterolisthesis of C4 on C5. 2 mm retrolisthesis of C5 on C6. Loss of the normal  cervical lordosis with straightening. Vertebrae: No acute fracture, evidence of discitis, or aggressive bone lesion. Cord: Normal signal and morphology. Posterior Fossa, vertebral arteries, paraspinal tissues: Posterior fossa demonstrates no focal abnormality. Vertebral artery flow voids are maintained. Paraspinal soft tissues are unremarkable. Disc levels: Discs: Degenerative disease with disc height loss at C5-6 with reactive endplate changes E5-6: No significant disc bulge. No neural foraminal stenosis. No central canal stenosis. C3-4: No significant disc bulge. No neural foraminal stenosis. No central canal stenosis. C4-5: No significant disc bulge. No neural foraminal stenosis. No central canal stenosis. Moderate left facet arthropathy. C5-6: Broad-based disc bulge impressing on the ventral thecal sac. Right uncovertebral degenerative changes. Moderate right foraminal stenosis. Mild left foraminal stenosis. No spinal stenosis. C6-7: No significant disc bulge. No neural foraminal stenosis. No central canal stenosis. C7-T1: No significant disc bulge. No neural foraminal stenosis. No central canal stenosis. IMPRESSION: 1. At C5-6 there is a broad-based disc bulge impressing on the ventral thecal sac. Right uncovertebral degenerative changes. Moderate right foraminal stenosis. Mild left foraminal stenosis. 2.  No acute osseous injury of the cervical spine. Electronically Signed   By: Kathreen Devoid M.D.   On: 09/22/2021 10:28    All questions were answered. The patient knows to call the clinic with any problems, questions or concerns. I spent 30 minutes in the care of this patient including H and P, review of records, counseling and coordination of care.  Benay Pike, MD 10/01/2021 7:42 AM

## 2021-10-07 ENCOUNTER — Telehealth: Payer: Self-pay | Admitting: *Deleted

## 2021-10-07 NOTE — Telephone Encounter (Addendum)
This RN called pt and obtained her identified VM- message left to return call for further information on scans that will be reassuring to her. This RN's name given for return call.   ----- Message from Benay Pike, MD sent at 10/01/2021  3:13 PM EDT ----- Thanks Dr Enriqueta Shutter. We will relay this to the patient. I told her that it is likely the case but she wanted to be sure.  ----- Message ----- From: Michiel Cowboy, MD Sent: 10/01/2021  12:46 PM EDT To: Benay Pike, MD  Hi Dr. Chryl Heck,  The background parenchymal enhancement on Breast MRIs can vary from exam to exam.  It often varies between mild and moderate, as in this case.  It does not effect her BC risk.  This exam was a very good exam and the background enhancement did not limit the evaluation in any way.  Hope this helps.  Cherlynn Kaiser    ----- Message ----- From: Benay Pike, MD Sent: 10/01/2021   9:45 AM EDT To: Michiel Cowboy, MD  Dr Enriqueta Shutter  This patient had MRI of the breast and she noticed in the report that you have commented on parenchymal enhancement to be mild in the past vs moderate. She would like to know what this means since he has a life time risk of BC of 21%. Please let me know what you think  Thanks,

## 2021-10-07 NOTE — Telephone Encounter (Signed)
See prior note

## 2021-10-16 ENCOUNTER — Encounter: Payer: Self-pay | Admitting: Hematology and Oncology

## 2021-11-12 ENCOUNTER — Encounter: Payer: Self-pay | Admitting: Hematology and Oncology

## 2021-11-13 ENCOUNTER — Telehealth: Payer: Self-pay

## 2021-11-13 DIAGNOSIS — N6320 Unspecified lump in the left breast, unspecified quadrant: Secondary | ICD-10-CM

## 2021-11-13 NOTE — Telephone Encounter (Signed)
I advised pt that Dx Mammo was order and [facility] should be calling to schedule.  FYI

## 2021-11-13 NOTE — Telephone Encounter (Signed)
Ok this is ordered

## 2021-11-13 NOTE — Telephone Encounter (Signed)
Pt is calling to request an order for a Dx Mammogram as she had a yrly screening but when asked the questions she has some concerns and thinks she felt something on left breast.   Pt is very concerned and is wanting to urgently have it looked at.  Please advise.  Pt cb [phone]

## 2021-11-17 ENCOUNTER — Ambulatory Visit: Payer: BC Managed Care – PPO | Admitting: Internal Medicine

## 2021-11-24 ENCOUNTER — Other Ambulatory Visit: Payer: BC Managed Care – PPO

## 2021-11-27 ENCOUNTER — Ambulatory Visit: Payer: BC Managed Care – PPO

## 2021-12-01 ENCOUNTER — Ambulatory Visit
Admission: RE | Admit: 2021-12-01 | Discharge: 2021-12-01 | Disposition: A | Discharge status: Home / Self Care | Payer: BC Managed Care – PPO | Source: Ambulatory Visit | Attending: Internal Medicine | Admitting: Internal Medicine

## 2021-12-01 DIAGNOSIS — N6320 Unspecified lump in the left breast, unspecified quadrant: Secondary | ICD-10-CM

## 2021-12-04 ENCOUNTER — Other Ambulatory Visit: Payer: Self-pay | Admitting: Internal Medicine

## 2022-01-02 ENCOUNTER — Other Ambulatory Visit: Payer: Self-pay | Admitting: Internal Medicine

## 2022-01-06 ENCOUNTER — Encounter: Payer: Self-pay | Admitting: Internal Medicine

## 2022-01-06 DIAGNOSIS — G8929 Other chronic pain: Secondary | ICD-10-CM

## 2022-01-06 DIAGNOSIS — M47812 Spondylosis without myelopathy or radiculopathy, cervical region: Secondary | ICD-10-CM

## 2022-01-29 ENCOUNTER — Other Ambulatory Visit: Payer: Self-pay | Admitting: Internal Medicine

## 2022-02-12 ENCOUNTER — Encounter: Payer: Self-pay | Admitting: Internal Medicine

## 2022-02-13 ENCOUNTER — Other Ambulatory Visit: Payer: Self-pay | Admitting: Internal Medicine

## 2022-02-13 MED ORDER — HYDROCODONE BIT-HOMATROP MBR 5-1.5 MG/5ML PO SOLN: 5.0000 mL | Freq: Four times a day (QID) | ORAL | 0 refills | Status: AC | PRN

## 2022-02-13 MED ORDER — NIRMATRELVIR/RITONAVIR (PAXLOVID)TABLET: 3.0000 | ORAL_TABLET | Freq: Two times a day (BID) | ORAL | 0 refills | Status: AC

## 2022-02-13 NOTE — Telephone Encounter (Signed)
LOV 11/14/20. Pt is COVID positive but we have no open slots and pt was recommended tylenol and mucinex. Do you recommend anything else?

## 2022-03-03 ENCOUNTER — Other Ambulatory Visit: Payer: Self-pay | Admitting: Internal Medicine

## 2022-03-03 ENCOUNTER — Encounter: Payer: Self-pay | Admitting: Internal Medicine

## 2022-03-04 ENCOUNTER — Other Ambulatory Visit: Payer: Self-pay | Admitting: Internal Medicine

## 2022-03-13 ENCOUNTER — Ambulatory Visit (INDEPENDENT_AMBULATORY_CARE_PROVIDER_SITE_OTHER): Payer: BC Managed Care – PPO | Admitting: Internal Medicine

## 2022-03-13 VITALS — BP 122/74 | HR 93 | Temp 98.0°F | Ht 66.0 in | Wt 156.0 lb

## 2022-03-13 DIAGNOSIS — M5030 Other cervical disc degeneration, unspecified cervical region: Secondary | ICD-10-CM | POA: Diagnosis not present

## 2022-03-13 DIAGNOSIS — E538 Deficiency of other specified B group vitamins: Secondary | ICD-10-CM

## 2022-03-13 DIAGNOSIS — F419 Anxiety disorder, unspecified: Secondary | ICD-10-CM

## 2022-03-13 DIAGNOSIS — Z0000 Encounter for general adult medical examination without abnormal findings: Secondary | ICD-10-CM

## 2022-03-13 DIAGNOSIS — E559 Vitamin D deficiency, unspecified: Secondary | ICD-10-CM | POA: Diagnosis not present

## 2022-03-13 DIAGNOSIS — R739 Hyperglycemia, unspecified: Secondary | ICD-10-CM | POA: Diagnosis not present

## 2022-03-13 DIAGNOSIS — E785 Hyperlipidemia, unspecified: Secondary | ICD-10-CM | POA: Diagnosis not present

## 2022-03-13 DIAGNOSIS — Z0001 Encounter for general adult medical examination with abnormal findings: Secondary | ICD-10-CM | POA: Diagnosis not present

## 2022-03-13 MED ORDER — ATORVASTATIN CALCIUM 10 MG PO TABS: ORAL_TABLET | ORAL | 3 refills | Status: DC

## 2022-03-13 MED ORDER — CLONAZEPAM 0.5 MG PO TABS: 0.5000 mg | ORAL_TABLET | Freq: Two times a day (BID) | ORAL | 5 refills | Status: DC | PRN

## 2022-03-13 NOTE — Patient Instructions (Addendum)
Please remember to have your second shingrix done at the pharmacy, as well as the Tdap tetanus shot  Please continue all other medications as before, and refills have been done if requested.  Please have the pharmacy call with any other refills you may need.  Please continue your efforts at being more active, low cholesterol diet, and weight control.  You are otherwise up to date with prevention measures today.  Please keep your appointments with your specialists as you may have planned  You will be contacted regarding the referral for: Spine and Scoliosis center  Please go to the LAB at the blood drawing area for the tests to be done - at the [facility] lab at your [facility] will be contacted by phone if any changes need to be made immediately.  Otherwise, you will receive a letter about your results with an explanation, but please check with MyChart first.  Please remember to sign up for MyChart if you have not done so, as this will be important to you in the future with finding out test results, communicating by private email, and scheduling acute appointments online when needed.  Please make an Appointment to return in 6 months, or sooner if needed

## 2022-03-13 NOTE — Progress Notes (Unsigned)
Patient ID: Cassandra Gallagher, female   DOB: February 12, 1960, 62 y.o.   MRN: [id number]         Chief Complaint:: wellness exam and Physical (Possible disc replacement needed per neuro, second opinion requested)  , anxiety, hld, hyperglycemia       HPI:  Cassandra Gallagher is a 62 y.o. female here for wellness exam; declines tdap, covid booster, flu shot but may consider shingrix at pharmacy, o/w up to date                Also s/p covid infection jan 2024, tx with paxlovid but had rash after 3 doses so stopped.  S/p NS recent eval for neck pain, had MRI and recommended for disc replacement but wondering about second opinon.  Has persistent pain without worsening UE radicular symptoms. Denies worsening depressive symptoms, suicidal ideation, or panic; has ongoing anxiety increased after ran out of med,, needs klonopin refill.  Tolerating statin well.  Pt denies chest pain, increased sob or doe, wheezing, orthopnea, PND, increased LE swelling, palpitations, dizziness or syncope.   Pt denies polydipsia, polyuria, or new focal neuro s/s.      Wt Readings from Last 3 Encounters:  03/13/22 156 lb (70.8 kg)  10/01/21 155 lb 14.4 oz (70.7 kg)  04/03/21 155 lb 9.6 oz (70.6 kg)   BP Readings from Last 3 Encounters:  03/13/22 122/74  10/01/21 122/83  04/03/21 123/71   Immunization History  Administered Date(s) Administered   Influenza Split 10/26/2011   Influenza,inj,Quad PF,6+ Mos 12/21/2017, 12/13/2018, 11/14/2020   PFIZER(Purple Top)SARS-COV-2 Vaccination 04/17/2019, 05/08/2019   Pneumococcal Conjugate-13 04/25/2018   Tdap 10/26/2011   Zoster Recombinat (Shingrix) 05/12/2021   Health Maintenance Due  Topic Date Due   DTaP/Tdap/Td (2 - Td or Tdap) 10/25/2021      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   Cervicalgia 07/26/2007   DEGENERATIVE DISC DISEASE, CERVICAL SPINE 06/07/2009   DEPRESSIVE DISORDER  07/26/2007   Family history of breast cancer    Grave's disease    hx of Grave's Disease/Hyperthyroidism   Hx of migraines    Hyperlipidemia 12/17/2011   Insomnia 06/02/2010   Lumbar degenerative disc disease 01/20/2011   Lump or mass in breast 11/14/2008   Palpitations 09/21/2008   UTI 05/11/2007   Past Surgical History:  Procedure Laterality Date   BREAST BIOPSY Right    BREAST SURGERY Bilateral 01/25/2020   BREAST REDUCTION    CESAREAN SECTION  10/29/2008   twins   DILITATION & CURRETTAGE/HYSTROSCOPY WITH VERSAPOINT RESECTION N/A 07/20/2012   Procedure: DILATATION & CURETTAGE/HYSTEROSCOPY WITH VERSAPOINT RESECTION;  Surgeon: Princess Bruins, MD;  Location: [facility];  Service: Gynecology;  Laterality: N/A;   HYSTEROSCOPY WITH D & C  07/20/2012   REDUCTION MAMMAPLASTY      reports that she quit smoking about 25 years ago. Her smoking use included cigarettes. She has never used smokeless tobacco. She reports that she does not drink alcohol and does not use drugs. family history includes Breast cancer in her maternal grandmother and paternal grandmother; Breast cancer (age of onset: 12) in her maternal aunt; Breast cancer (age of onset: 53) in her mother; Cancer in her brother and father; Hypertension in her mother. Allergies  Allergen Reactions   Paxlovid [Nirmatrelvir-Ritonavir] Hives   Current Outpatient Medications on File Prior to Visit  Medication Sig Dispense Refill   aspirin 325 MG tablet Take 325 mg  by mouth as needed.     clobetasol (TEMOVATE) 0.05 % external solution 1 application Externally Once a day for 30 days     fluocinolone (SYNALAR) 0.01 % external solution as directed externally once a day for 30 days     folic acid (FOLVITE) 1 MG tablet Take by mouth.     Fremanezumab-vfrm 225 MG/1.5ML SOSY Inject 3 Syringes into the skin every 3 (three) months.      HYDROcodone-acetaminophen (NORCO/VICODIN) 5-325 MG tablet Take 1 tablet by mouth every 6 (six) hours as needed.  (Patient taking differently: Take 1.5 tablets by mouth daily.) 10 tablet 0   Minoxidil (ROGAINE MENS EXTRA STRENGTH) 5 % FOAM as directed Externally     Multiple Vitamins-Minerals (WOMENS MULTIVITAMIN) TABS as directed Orally     prednisoLONE acetate (PRED FORTE) 1 % ophthalmic suspension SMARTSIG:In Eye(s)     RIZATRIPTAN BENZOATE PO Take 1 tablet by mouth daily as needed (migraines).     tiZANidine (ZANAFLEX) 4 MG tablet Take 4-8 mg by mouth daily as needed.     tretinoin (RETIN-A) 6.295 % cream 1 application in the evening to face Externally pea sized amount once at night for 30 days     ZOMIG 2.5 MG SOLN Place 1 spray into both nostrils daily as needed (migraines).      No current facility-administered medications on file prior to visit.        ROS:  All others reviewed and negative.  Objective        PE:  BP 122/74 (BP Location: Right Arm, Patient Position: Sitting, Cuff Size: Large)   Pulse 93   Temp 98 F (36.7 C) (Oral)   Ht '5\' 6"'$  (1.676 m)   Wt 156 lb (70.8 kg)   LMP 04/04/2012   SpO2 95%   BMI 25.18 kg/m                 Constitutional: Pt appears in NAD               HENT: Head: NCAT.                Right Ear: External ear normal.                 Left Ear: External ear normal.                Eyes: . Pupils are equal, round, and reactive to light. Conjunctivae and EOM are normal               Nose: without d/c or deformity               Neck: Neck supple. Gross normal ROM               Cardiovascular: Normal rate and regular rhythm.                 Pulmonary/Chest: Effort normal and breath sounds without rales or wheezing.                Abd:  Soft, NT, ND, + BS, no organomegaly               Neurological: Pt is alert. At baseline orientation, motor grossly intact               Skin: Skin is warm. No rashes, no other new lesions, LE edema - none               Psychiatric: Pt behavior  is normal without agitation , nervous  Micro: none  Cardiac tracings I have  personally interpreted today:  none  Pertinent Radiological findings (summarize): none   Lab Results  Component Value Date   WBC 5.7 06/30/2019   HGB 13.3 06/30/2019   HCT 39.6 06/30/2019   PLT 259.0 06/30/2019   GLUCOSE 105 (H) 06/30/2019   CHOL 202 (H) 06/30/2019   TRIG 129.0 06/30/2019   HDL 40.50 06/30/2019   LDLDIRECT 186.0 02/22/2018   LDLCALC 136 (H) 06/30/2019   ALT 21 06/30/2019   AST 17 06/30/2019   NA 139 06/30/2019   K 3.7 06/30/2019   CL 106 06/30/2019   CREATININE 0.63 06/30/2019   BUN 14 06/30/2019   CO2 30 06/30/2019   TSH 0.73 06/30/2019   HGBA1C 5.7 06/30/2019   Assessment/Plan:  Starling Jessie is a 62 y.o. White or Caucasian [1] female with  has a past medical history of Anxiety (06/02/2010), BACK PAIN (07/26/2007), Cervical spine degeneration (01/20/2011), Cervicalgia (07/26/2007), DEGENERATIVE DISC DISEASE, CERVICAL SPINE (06/07/2009), DEPRESSIVE DISORDER (07/26/2007), Family history of breast cancer, Grave's disease, migraines, Hyperlipidemia (12/17/2011), Insomnia (06/02/2010), Lumbar degenerative disc disease (01/20/2011), Lump or mass in breast (11/14/2008), Palpitations (09/21/2008), and UTI (05/11/2007).  Encounter for well adult exam with abnormal findings Age and sex appropriate education and counseling updated with regular exercise and diet Referrals for preventative services - none needed Immunizations addressed - declines tdap, covid booster, flu shot but for shingrix at pharmacy Smoking counseling  - none needed Evidence for depression or other mood disorder - chronic anxiety recent worsening after ran out of klonopin Most recent labs reviewed. I have personally reviewed and have noted: 1) the patient's medical and social history 2) The patient's current medications and supplements 3) The patient's height, weight, and BMI have been recorded in the chart   Hyperglycemia Lab Results  Component Value Date   HGBA1C 5.7 06/30/2019   Stable, pt to  continue current medical treatment  - diet, wt control   Hyperlipidemia Lab Results  Component Value Date   LDLCALC 136 (H) 06/30/2019   Uncontrolled, to continue lipitor 10 mg and f/u lab    DDD (degenerative disc disease), cervical With worsening chronic pain, has seen neurosurgury recommended for disc replacement per pt, asking for second opinion - ok for referral [facility]  Anxiety Uncontrolled recent worsening after out of med tx  - for refill klonopin  bid prn  Followup: Return in about 6 months (around 09/11/2022).  Cathlean Cower, MD 03/14/2022 6:21 PM [facility]

## 2022-03-14 ENCOUNTER — Encounter: Payer: Self-pay | Admitting: Internal Medicine

## 2022-03-14 DIAGNOSIS — M5030 Other cervical disc degeneration, unspecified cervical region: Secondary | ICD-10-CM | POA: Insufficient documentation

## 2022-03-14 NOTE — Assessment & Plan Note (Signed)
Lab Results  Component Value Date   HGBA1C 5.7 06/30/2019   Stable, pt to continue current medical treatment  - diet, wt control

## 2022-03-14 NOTE — Assessment & Plan Note (Signed)
Age and sex appropriate education and counseling updated with regular exercise and diet Referrals for preventative services - none needed Immunizations addressed - declines tdap, covid booster, flu shot but for shingrix at pharmacy Smoking counseling  - none needed Evidence for depression or other mood disorder - chronic anxiety recent worsening after ran out of klonopin Most recent labs reviewed. I have personally reviewed and have noted: 1) the patient's medical and social history 2) The patient's current medications and supplements 3) The patient's height, weight, and BMI have been recorded in the chart

## 2022-03-14 NOTE — Assessment & Plan Note (Signed)
With worsening chronic pain, has seen neurosurgury recommended for disc replacement per pt, asking for second opinion - ok for referral [facility] center

## 2022-03-14 NOTE — Assessment & Plan Note (Signed)
Lab Results  Component Value Date   LDLCALC 136 (H) 06/30/2019   Uncontrolled, to continue lipitor 10 mg and f/u lab

## 2022-03-14 NOTE — Assessment & Plan Note (Signed)
Uncontrolled recent worsening after out of med tx  - for refill klonopin  bid prn

## 2022-04-01 ENCOUNTER — Ambulatory Visit: Payer: BC Managed Care – PPO | Admitting: Orthopaedic Surgery

## 2022-04-03 ENCOUNTER — Encounter: Payer: Self-pay | Admitting: Hematology and Oncology

## 2022-04-03 ENCOUNTER — Other Ambulatory Visit: Payer: Self-pay

## 2022-04-03 ENCOUNTER — Inpatient Hospital Stay: Payer: BC Managed Care – PPO | Attending: Hematology and Oncology | Admitting: Hematology and Oncology

## 2022-04-03 VITALS — BP 135/81 | HR 95 | Temp 98.0°F | Resp 18 | Ht 66.0 in | Wt 160.5 lb

## 2022-04-03 DIAGNOSIS — Z411 Encounter for cosmetic surgery: Secondary | ICD-10-CM | POA: Insufficient documentation

## 2022-04-03 DIAGNOSIS — Z9189 Other specified personal risk factors, not elsewhere classified: Secondary | ICD-10-CM

## 2022-04-03 DIAGNOSIS — Z801 Family history of malignant neoplasm of trachea, bronchus and lung: Secondary | ICD-10-CM | POA: Diagnosis present

## 2022-04-03 DIAGNOSIS — N62 Hypertrophy of breast: Secondary | ICD-10-CM | POA: Insufficient documentation

## 2022-04-03 NOTE — Progress Notes (Signed)
[facility] NOTE  Patient Care Team: Biagio Borg, MD as PCP - General  CHIEF COMPLAINTS/PURPOSE OF CONSULTATION:  [place], follow-up  ASSESSMENT & PLAN:   This is a pleasant 62 year old female patient with past medical history significant for atypical lobular hyperplasia noted on breast reduction referred to high risk breast clinic for surveillance and recommendations.   Based upon NCI breast cancer risk assessment tool: Life time risk of breast cancer is 21% per TC model. Her previously calculated 5 yr risk of breast cancer is 4.2%. Genetic testing negative for any VUS or pathogenic mutations. Last mammogram and [place] from Oct 2023 with no concerning abnormality over the inner lower left breast to account for patient's palpable abnormality. No concerns for recurrence on exam.  She once again remains anxious about the chance of having breast cancer.  She we have once again discussed about antiestrogen therapy and she is not interested at this time.  She would like to continue MRIs alternating with mammograms.  I have ordered her next MRI.  She will return to clinic in 1 year or sooner as needed.  She was advised of self breast exam and to report any changes immediately.  HISTORY OF PRESENTING ILLNESS:   Cassandra Gallagher 62 y.o. female is here because of  [facility] and increased risk of breast cancer.  She is here for follow up.  Since last visit she had a mammogram and ultrasound which was unremarkable.  She denies any new breast changes.  She remains anxious about the possibility of having breast cancer given her family history. 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   Cervicalgia 07/26/2007   DEGENERATIVE DISC DISEASE, CERVICAL SPINE 06/07/2009   DEPRESSIVE DISORDER 07/26/2007   Family history of breast cancer     Grave's disease    hx of Grave's Disease/Hyperthyroidism   Hx of migraines    Hyperlipidemia 12/17/2011   Insomnia 06/02/2010   Lumbar degenerative disc disease 01/20/2011   Lump or mass in breast 11/14/2008   Palpitations 09/21/2008   UTI 05/11/2007    SURGICAL HISTORY: Past Surgical History:  Procedure Laterality Date   BREAST BIOPSY Right    BREAST SURGERY Bilateral 01/25/2020   BREAST REDUCTION    CESAREAN SECTION  10/29/2008   twins   DILITATION & CURRETTAGE/HYSTROSCOPY WITH VERSAPOINT RESECTION N/A 07/20/2012   Procedure: DILATATION & CURETTAGE/HYSTEROSCOPY WITH VERSAPOINT RESECTION;  Surgeon: Princess Bruins, MD;  Location: [facility];  Service: Gynecology;  Laterality: N/A;   HYSTEROSCOPY WITH D & C  07/20/2012   REDUCTION MAMMAPLASTY      SOCIAL HISTORY: Social History   Socioeconomic History   Marital status: Married    Spouse name: Not on file   Number of children: 2   Years of education: Not on file   Highest education level: Not on file  Occupational History   Occupation: Engineer, building services: [employer]  Tobacco Use   Smoking status: Former    Types: Cigarettes    Quit date: 11/24/1996    Years since quitting: 25.3   Smokeless tobacco: Never   Tobacco comments:    smoked for 6 years, quit 15 years ago  Vaping Use   Vaping Use: Never used  Substance and Sexual Activity   Alcohol use: No   Drug use:  No   Sexual activity: Yes    Birth control/protection: Post-menopausal    Comment: married- 29 yrs   Other Topics Concern   Not on file  Social History Narrative   2nd child born recently in September 2010.   Married, one child from previous marriage lives in [place]   Social Determinants of Health   Financial Resource Strain: Not on file  Food Insecurity: Not on file  Transportation Needs: Not on file  Physical Activity: Not on file  Stress: Not on file  Social Connections: Not on file  Intimate Partner Violence: Not on file    FAMILY  HISTORY: Family History  Problem Relation Age of Onset   Hypertension Mother    Breast cancer Mother 94   Cancer Father        lung   Cancer Brother        lung    Breast cancer Maternal Aunt 58   Breast cancer Paternal Grandmother    Breast cancer Maternal Grandmother     ALLERGIES:  is allergic to paxlovid [nirmatrelvir-ritonavir].  MEDICATIONS:  Current Outpatient Medications  Medication Sig Dispense Refill   aspirin 325 MG tablet Take 325 mg by mouth as needed.     atorvastatin (LIPITOR) 10 MG tablet Take 1 tablet every day by oral route. 90 tablet 3   clobetasol (TEMOVATE) 0.05 % external solution 1 application Externally Once a day for 30 days     clonazePAM (KLONOPIN) 0.5 MG tablet Take 1 tablet (0.5 mg total) by mouth 2 (two) times daily as needed. 60 tablet 5   fluocinolone (SYNALAR) 0.01 % external solution as directed externally once a day for 30 days     folic acid (FOLVITE) 1 MG tablet Take by mouth.     Fremanezumab-vfrm 225 MG/1.5ML SOSY Inject 3 Syringes into the skin every 3 (three) months.      HYDROcodone-acetaminophen (NORCO/VICODIN) 5-325 MG tablet Take 1 tablet by mouth every 6 (six) hours as needed. (Patient taking differently: Take 1.5 tablets by mouth daily.) 10 tablet 0   Minoxidil (ROGAINE MENS EXTRA STRENGTH) 5 % FOAM as directed Externally     Multiple Vitamins-Minerals (WOMENS MULTIVITAMIN) TABS as directed Orally     prednisoLONE acetate (PRED FORTE) 1 % ophthalmic suspension SMARTSIG:In Eye(s)     RIZATRIPTAN BENZOATE PO Take 1 tablet by mouth daily as needed (migraines).     tiZANidine (ZANAFLEX) 4 MG tablet Take 4-8 mg by mouth daily as needed.     tretinoin (RETIN-A) [id number] % cream 1 application in the evening to face Externally pea sized amount once at night for 30 days     ZOMIG 2.5 MG SOLN Place 1 spray into both nostrils daily as needed (migraines).      No current facility-administered medications for this visit.    PHYSICAL  EXAMINATION: ECOG PERFORMANCE STATUS: 0 - Asymptomatic  Vitals:   04/03/22 0947  BP: 135/81  Pulse: 95  Resp: 18  Temp: 98 F (36.7 C)  SpO2: 95%    Filed Weights   04/03/22 0947  Weight: 160 lb 8 oz (72.8 kg)    Physical Exam HENT:     Head: Normocephalic and atraumatic.  Chest:     Comments: Bilateral breasts inspected.  No concerns.  On palpation no masses or regional adenopathy. Musculoskeletal:        General: No swelling.     Cervical back: Normal range of motion and neck supple. No rigidity.  Lymphadenopathy:  Cervical: No cervical adenopathy.  Neurological:     General: No focal deficit present.     LABORATORY DATA:  I have reviewed the data as listed Lab Results  Component Value Date   WBC 5.7 06/30/2019   HGB 13.3 06/30/2019   HCT 39.6 06/30/2019   MCV 93.7 06/30/2019   PLT 259.0 06/30/2019     Chemistry      Component Value Date/Time   NA 139 06/30/2019 1055   K 3.7 06/30/2019 1055   CL 106 06/30/2019 1055   CO2 30 06/30/2019 1055   BUN 14 06/30/2019 1055   CREATININE 0.63 06/30/2019 1055      Component Value Date/Time   CALCIUM 9.9 06/30/2019 1055   ALKPHOS 66 06/30/2019 1055   AST 17 06/30/2019 1055   ALT 21 06/30/2019 1055   BILITOT 0.3 06/30/2019 1055       RADIOGRAPHIC STUDIES: I have personally reviewed the radiological images as listed and agreed with the findings in the report. No results found.  All questions were answered. The patient knows to call the clinic with any problems, questions or concerns. I spent 30 minutes in the care of this patient including H and P, review of records, counseling and coordination of care.  Benay Pike, MD 04/03/2022 1:54 PM

## 2022-04-06 ENCOUNTER — Telehealth: Payer: Self-pay | Admitting: Hematology and Oncology

## 2022-04-06 NOTE — Telephone Encounter (Signed)
Spoke with patient confirming upcoming appointments  

## 2022-05-28 ENCOUNTER — Telehealth: Payer: Self-pay | Admitting: *Deleted

## 2022-05-28 NOTE — Telephone Encounter (Addendum)
This RN spoke with pt and informed her due to MRI of breast ordered at [facility] who has obtained the authorization- need to schedule with them.  Pt verbalized understanding.  No further needs. ----- Message from Rachel Moulds, MD sent at 05/28/2022 10:32 AM EDT ----- Regarding: RE: MRI Ok we will let her know. ----- Message ----- From: Jodelle Red Sent: 05/28/2022   8:11 AM EDT To: Billey Co, RN; Rachel Moulds, MD Subject: RE: MRI                                        I would assume since they already have the approval. It will not be approved again.   ----- Message ----- From: Rachel Moulds, MD Sent: 05/27/2022   5:57 PM EDT To: Billey Co, RN; Jodelle Red Subject: RE: MRI                                        In that case, should she do it at [facility]?  ----- Message ----- From: Jodelle Red Sent: 05/27/2022   3:27 PM EDT To: Rachel Moulds, MD Subject: MRI                                            Hey Dr. Al Pimple,   Patient is scheduled for an Breast MRI at 06/08/2022. I am unable to obtain prior authorization because a Dr. Ronny Flurry have already gotten authorization for [facility]. Do you know if patient is seeking care elsewhere and if the MRI should be cancelled with [facility]?  Below is the authorization on [employer]. Please let me know how to proceed.   Health Plan: [employer]  Scheduled Date of Service: 05/20/2022   Order ID: [id number]       Authorized  Approval Valid Through: 05/13/2022 - 06/11/2022  Member Information: Boehner , Tahjanae Member #: [id number] [address] [place] , [place] [id number] Date of Birth: Oct 26, 1960 Phone: [phone]  Ordering Provider: [address] [place] , [place] [address] Phone: [phone] Fax: [phone] NPI: [id number]  Servicing Provider:  [address]  [place] , [place]  [address] Phone: [phone] Fax: NPI: [id number] TIN: [id number]

## 2022-06-08 ENCOUNTER — Ambulatory Visit (HOSPITAL_COMMUNITY): Payer: BC Managed Care – PPO

## 2022-09-22 ENCOUNTER — Other Ambulatory Visit: Payer: Self-pay | Admitting: Internal Medicine

## 2022-12-03 ENCOUNTER — Ambulatory Visit: Payer: BC Managed Care – PPO

## 2023-02-08 LAB — HM COLONOSCOPY

## 2023-02-25 ENCOUNTER — Ambulatory Visit: Payer: 59 | Admitting: Clinical

## 2023-02-25 DIAGNOSIS — F4321 Adjustment disorder with depressed mood: Secondary | ICD-10-CM

## 2023-02-25 NOTE — Progress Notes (Signed)
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 Following verbal review of consent forms, concerns about privacy of records were addressed, and intake questions were completed. Gilma actively participated in creation of a treatment plan, providing input and consent into all goals and objectives. She is scheduled to be seen again in one week.  Intake Presenting Problem Orean described her life as stable overall, but reported that she has grappled with feelings of loneliness and difficulty with motivation for an extended period. She would like a stronger social support network. She has two 56 year-old children. She is from [place] and her husband in [place]. She reported that, while in [place], she had a diverse and extensive social network, and she misses this. She has lived in [place] for 15 years. She reported that she does not yet feel home, and has stayed in [place] for her husband's job. She struggles with motivation to initiate tasks she enjoys and reported a sense of missing her connection with others. She was previously married for 15 years before her first marriage ended in 2008. She and her current husband began living together in 2010. She works in Hotel manager.  Symptoms Problems in family of origin: Evanne has a sister and three brothers. One of her brothers passed of lung cancer during covid. She reported that her family struggled financially and her father was an alcoholic until he was 31. This resulted in some volatility during her teen years. She had minimal emotional support as a teenager. At intake, she described her relationship with her family of origin as having been better in the past.  She described her brother who passed as the sibling she trusted the most. She and her sister both have twin children, and have connected over this. Tarry' mother passed in 2014.   Risk Assessment:  Not at present. Client  reported passing suicidal ideation without plan or intent at the time of the end of her first marriage in 2008.   Diagnoses:   Family: Yudith lives with husband and 88 year-old twin daughters.   Medical: Lusia has experienced chronic pain for about 13 years. She reported that, at 63 years old, she began waking in "extraordinary pain." This included recurrent migraines resulting in repeated vomiting. She is under the regular care of Dr. Estella Husk for neurology and reported symptoms have improved. At intake, she was prescribed atorvastatin, ajovi injections for migraines, Tylenol 3 as needed.   Substance use: Tenee does not drink or use any substances. She used to drink socially, but her husband does not, so she eventually stopped.   No needy reported when aske General Behavior: cooperative  Attire: appropriate  Gait: normal  Motor Activity: normal  Stream of Thought - Productivity:   Stream of thought - Progression: normal  Stream of thought - Language: normal  Emotional tone and reactions - Mood: normal  Emotional tone and reactions - Affect: appropriate  Mental trend/Content of thoughts - Perception: normal  Mental trend/Content of thoughts - Orientation: normal  Mental trend/Content of thoughts - Memory: normal  Mental trend/Content of thoughts - General knowledge: consistent with education  Insight: good  Judgment: good  Diagnostic Summary  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  Client Statement of Needs Henri would like to increase feelings of social connection and peace with her life.   Treatment Level  weekly   Symptoms  Lack  of motivation, difficulty initiating tasks, depressed mood Problems Addressed  Mikeshia feels disconnected from her surrounding community.  Goals 1. Kieley would like to improve and develop her connections with  others Objectives Laquaya would like to schedule regular tennis dates with others Target Date: 02/25/2024 Frequency: weekly  Progress: 0 Modality: Individual therapy   Objectives 2. Karthika would like to feel more settled and content in her life as it is.  Kaliyana will improve her feelings about her work situations. Target: 02/25/2024, Modality: Individual Frequency: weekly, Progress: 0 Cyrene would like to identify interests to share with her husband outside of work  Objective  Interventions Faige will have opportunities to process her experiences in session Therapist will help Antione notice and disengage from maladaptive thoughts and behaviors using CBT based strategies Therapist will engage Makinnley in behavior activation, including pleasant event scheduling and incorporation of structure and mastery events into her routine. Therapist will provide referrals for additional resources as needed               Chrissie Noa, PhD

## 2023-03-04 ENCOUNTER — Ambulatory Visit (INDEPENDENT_AMBULATORY_CARE_PROVIDER_SITE_OTHER): Payer: 59 | Admitting: Clinical

## 2023-03-04 DIAGNOSIS — F4321 Adjustment disorder with depressed mood: Secondary | ICD-10-CM

## 2023-03-04 NOTE — Progress Notes (Signed)
Time: 10:00am-11:00am Diagnosis: F43.21 CPT Code: 78469G  Cassandra Gallagher was seen in person for individual therapy. Session consisted of discussion of family dynamics in her childhood. Cassandra Gallagher reflected upon her relationship with her mother. Therapist offered psychoeducation on attachment theory, and encouraged her to explore whether her childhood experiences continue to impact her present relationships. She reported that meditation has been helpful, and therapist encouraged her to resume her meditation routine in the coming week. She is scheduled to be seen again in one week.  Intake Presenting Problem Cassandra Gallagher described her life as stable overall, but reported that she has grappled with feelings of loneliness and difficulty with motivation for an extended period. She would like a stronger social support network. She has two 66 year-old children. She is from [place] and her husband in [place]. She reported that, while in [place], she had a diverse and extensive social network, and she misses this. She has lived in [place] for 15 years. She reported that she does not yet feel home, and has stayed in [place] for her husband's job. She struggles with motivation to initiate tasks she enjoys and reported a sense of missing her connection with others. She was previously married for 15 years before her first marriage ended in 2008. She and her current husband began living together in 2010. She works in Hotel manager.  Symptoms Problems in family of origin: Cassandra Gallagher has a sister and three brothers. One of her brothers passed of lung cancer during covid. She reported that her family struggled financially and her father was an alcoholic until he was 65. This resulted in some volatility during her teen years. She had minimal emotional support as a teenager. At intake, she described her relationship with her family of origin as having been better in the past.  She described her brother who passed as the sibling she  trusted the most. She and her sister both have twin children, and have connected over this. [name] passed in 2014.   Risk Assessment:  Not at present. Client reported passing suicidal ideation without plan or intent at the time of the end of her first marriage in 2008.   Diagnoses:   Family: Cassandra Gallagher lives with husband and 23 year-old twin daughters.   Medical: Cassandra Gallagher has experienced chronic pain for about 13 years. She reported that, at 63 years old, she began waking in "extraordinary pain." This included recurrent migraines resulting in repeated vomiting. She is under the regular care of Dr. Estella Husk for neurology and reported symptoms have improved. At intake, she was prescribed atorvastatin, ajovi injections for migraines, Tylenol 3 as needed.   Substance use: Cassandra Gallagher does not drink or use any substances. She used to drink socially, but her husband does not, so she eventually stopped.   No needy reported when aske General Behavior: cooperative  Attire: appropriate  Gait: normal  Motor Activity: normal  Stream of Thought - Productivity:   Stream of thought - Progression: normal  Stream of thought - Language: normal  Emotional tone and reactions - Mood: normal  Emotional tone and reactions - Affect: appropriate  Mental trend/Content of thoughts - Perception: normal  Mental trend/Content of thoughts - Orientation: normal  Mental trend/Content of thoughts - Memory: normal  Mental trend/Content of thoughts - General knowledge: consistent with education  Insight: good  Judgment: good  Diagnostic Summary  Adjustment disorder with depression (f43.22)  Treatment Plan Client Abilities/Strengths   Client Treatment Preferences Cassandra Gallagher prefers in person appointments.  Client Statement of Needs Cassandra Gallagher would  like to increase feelings of social connection and peace with her life.   Treatment Level  weekly   Symptoms  Lack of motivation, difficulty initiating tasks, depressed  mood Problems Addressed  Cassandra Gallagher feels disconnected from her surrounding community.  Goals 1. Cassandra Gallagher would like to improve and develop her connections with others Objectives Cassandra Gallagher would like to schedule regular tennis dates with others Target Date: 02/25/2024 Frequency: weekly  Progress: 0 Modality: Individual therapy   Objectives 2. Cassandra Gallagher would like to feel more settled and content in her life as it is.  Cassandra Gallagher will improve her feelings about her work situations. Target: 02/25/2024, Modality: Individual Frequency: weekly, Progress: 0 Cassandra Gallagher would like to identify interests to share with her husband outside of work  Objective  Interventions Cassandra Gallagher will have opportunities to process her experiences in session Therapist will help Cassandra Gallagher notice and disengage from maladaptive thoughts and behaviors using CBT based strategies Therapist will engage Cassandra Gallagher in behavior activation, including pleasant event scheduling and incorporation of structure and mastery events into her routine. Therapist will provide referrals for additional resources as needed               Cassandra Noa, PhD               Cassandra Noa, PhD

## 2023-03-11 ENCOUNTER — Ambulatory Visit: Payer: Self-pay | Admitting: Clinical

## 2023-03-30 ENCOUNTER — Encounter: Payer: Self-pay | Admitting: Internal Medicine

## 2023-03-30 ENCOUNTER — Ambulatory Visit (INDEPENDENT_AMBULATORY_CARE_PROVIDER_SITE_OTHER): Payer: 59 | Admitting: Internal Medicine

## 2023-03-30 VITALS — BP 122/74 | HR 82 | Temp 97.8°F | Ht 66.0 in | Wt 168.0 lb

## 2023-03-30 DIAGNOSIS — R9431 Abnormal electrocardiogram [ECG] [EKG]: Secondary | ICD-10-CM

## 2023-03-30 DIAGNOSIS — F419 Anxiety disorder, unspecified: Secondary | ICD-10-CM

## 2023-03-30 DIAGNOSIS — E785 Hyperlipidemia, unspecified: Secondary | ICD-10-CM | POA: Diagnosis not present

## 2023-03-30 DIAGNOSIS — Z0001 Encounter for general adult medical examination with abnormal findings: Secondary | ICD-10-CM

## 2023-03-30 DIAGNOSIS — R739 Hyperglycemia, unspecified: Secondary | ICD-10-CM | POA: Diagnosis not present

## 2023-03-30 DIAGNOSIS — K5900 Constipation, unspecified: Secondary | ICD-10-CM | POA: Insufficient documentation

## 2023-03-30 DIAGNOSIS — E559 Vitamin D deficiency, unspecified: Secondary | ICD-10-CM

## 2023-03-30 DIAGNOSIS — E039 Hypothyroidism, unspecified: Secondary | ICD-10-CM

## 2023-03-30 DIAGNOSIS — Z23 Encounter for immunization: Secondary | ICD-10-CM

## 2023-03-30 MED ORDER — CLONAZEPAM 0.5 MG PO TABS: 0.5000 mg | ORAL_TABLET | Freq: Two times a day (BID) | ORAL | 5 refills | Status: DC | PRN

## 2023-03-30 MED ORDER — ATORVASTATIN CALCIUM 10 MG PO TABS: ORAL_TABLET | ORAL | 3 refills | Status: AC

## 2023-03-30 NOTE — Patient Instructions (Signed)
You had the flu shot today  Please continue all other medications as before, and refills have been done if requested.  Please have the pharmacy call with any other refills you may need.  Please continue your efforts at being more active, low cholesterol diet, and weight control.  You are otherwise up to date with prevention measures today.  Please keep your appointments with your specialists as you may have planned  You will be contacted regarding the referral for: Cardiac CT score  Please go to the LAB at the blood drawing area for the tests to be done  You will be contacted by phone if any changes need to be made immediately.  Otherwise, you will receive a letter about your results with an explanation, but please check with MyChart first.  Please make an Appointment to return for your 1 year visit, or sooner if needed

## 2023-03-30 NOTE — Progress Notes (Signed)
 Patient ID: Cassandra Gallagher, female   DOB: 1960/05/11, 63 y.o.   MRN: [id number]         Chief Complaint:: wellness exam and low thyroid, hld, hyperglycemia, anxiety, low vit d       HPI:  Cassandra Gallagher is a 63 y.o. female here for wellness exam; for shingrix and tdap at pharmacy, declines covid booster, for flu shot today, o/w up to date                        Also Pt denies chest pain, increased sob or doe, wheezing, orthopnea, PND, increased LE swelling, palpitations, dizziness or syncope.   Pt denies polydipsia, polyuria, or new focal neuro s/s.    Pt denies fever, wt loss, night sweats, loss of appetite, or other constitutional symptoms  Denies worsening depressive symptoms, suicidal ideation, or panic;  Willing for Card Ct score.  Denies hyper or hypo thyroid symptoms such as voice, skin or hair change.    Wt Readings from Last 3 Encounters:  03/30/23 168 lb (76.2 kg)  04/03/22 160 lb 8 oz (72.8 kg)  03/13/22 156 lb (70.8 kg)   BP Readings from Last 3 Encounters:  03/30/23 122/74  04/03/22 135/81  03/13/22 122/74   Immunization History  Administered Date(s) Administered   Influenza Split 10/26/2011   Influenza, Seasonal, Injecte, Preservative Fre 03/30/2023   Influenza,inj,Quad PF,6+ Mos 12/21/2017, 12/13/2018, 11/14/2020   PFIZER(Purple Top)SARS-COV-2 Vaccination 04/17/2019, 05/08/2019   Pneumococcal Conjugate-13 04/25/2018   Tdap 10/26/2011   Zoster Recombinant(Shingrix) 05/12/2021   Health Maintenance Due  Topic Date Due   Zoster Vaccines- Shingrix (2 of 2) 07/07/2021   DTaP/Tdap/Td (2 - Td or Tdap) 10/25/2021      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   Cervicalgia 07/26/2007   DEGENERATIVE DISC DISEASE, CERVICAL SPINE 06/07/2009   DEPRESSIVE DISORDER 07/26/2007   Family history of breast cancer    Grave's disease    hx of Grave's Disease/Hyperthyroidism   Hx of migraines     Hyperlipidemia 12/17/2011   Insomnia 06/02/2010   Lumbar degenerative disc disease 01/20/2011   Lump or mass in breast 11/14/2008   Palpitations 09/21/2008   UTI 05/11/2007   Past Surgical History:  Procedure Laterality Date   BREAST BIOPSY Right    BREAST SURGERY Bilateral 01/25/2020   BREAST REDUCTION    CESAREAN SECTION  10/29/2008   twins   DILITATION & CURRETTAGE/HYSTROSCOPY WITH VERSAPOINT RESECTION N/A 07/20/2012   Procedure: DILATATION & CURETTAGE/HYSTEROSCOPY WITH VERSAPOINT RESECTION;  Surgeon: Genia Del, MD;  Location: [facility];  Service: Gynecology;  Laterality: N/A;   HYSTEROSCOPY WITH D & C  07/20/2012   REDUCTION MAMMAPLASTY      reports that she quit smoking about 26 years ago. Her smoking use included cigarettes. She has never used smokeless tobacco. She reports that she does not drink alcohol and does not use drugs. family history includes Breast cancer in her maternal grandmother and paternal grandmother; Breast cancer (age of onset: 60) in her maternal aunt; Breast cancer (age of onset: 60) in her mother; Cancer in her brother and father; Hypertension in her mother. Allergies  Allergen Reactions   Paxlovid [Nirmatrelvir-Ritonavir] Hives   Current Outpatient Medications on File Prior to Visit  Medication Sig Dispense Refill   clobetasol (TEMOVATE) 0.05 % external solution 1 application Externally Once a day for 30 days  fluocinolone (SYNALAR) 0.01 % external solution as directed externally once a day for 30 days     folic acid (FOLVITE) 1 MG tablet Take by mouth.     Fremanezumab-vfrm 225 MG/1.5ML SOSY Inject 3 Syringes into the skin every 3 (three) months.      Multiple Vitamins-Minerals (WOMENS MULTIVITAMIN) TABS as directed Orally     RIZATRIPTAN BENZOATE PO Take 1 tablet by mouth daily as needed (migraines).     tiZANidine (ZANAFLEX) 4 MG tablet Take 4-8 mg by mouth daily as needed.     tretinoin (RETIN-A) 0.025 % cream 1 application in the evening to  face Externally pea sized amount once at night for 30 days     No current facility-administered medications on file prior to visit.        ROS:  All others reviewed and negative.  Objective        PE:  BP 122/74 (BP Location: Right Arm, Patient Position: Sitting, Cuff Size: Normal)   Pulse 82   Temp 97.8 F (36.6 C) (Oral)   Ht 5\' 6"  (1.676 m)   Wt 168 lb (76.2 kg)   LMP 04/04/2012   SpO2 98%   BMI 27.12 kg/m                 Constitutional: Pt appears in NAD               HENT: Head: NCAT.                Right Ear: External ear normal.                 Left Ear: External ear normal.                Eyes: . Pupils are equal, round, and reactive to light. Conjunctivae and EOM are normal               Nose: without d/c or deformity               Neck: Neck supple. Gross normal ROM               Cardiovascular: Normal rate and regular rhythm.                 Pulmonary/Chest: Effort normal and breath sounds without rales or wheezing.                Abd:  Soft, NT, ND, + BS, no organomegaly               Neurological: Pt is alert. At baseline orientation, motor grossly intact               Skin: Skin is warm. No rashes, no other new lesions, LE edema - none               Psychiatric: Pt behavior is normal without agitation   Micro: none  Cardiac tracings I have personally interpreted today:  none  Pertinent Radiological findings (summarize): none   Lab Results  Component Value Date   WBC 5.7 06/30/2019   HGB 13.3 06/30/2019   HCT 39.6 06/30/2019   PLT 259.0 06/30/2019   GLUCOSE 105 (H) 06/30/2019   CHOL 202 (H) 06/30/2019   TRIG 129.0 06/30/2019   HDL 40.50 06/30/2019   LDLDIRECT 186.0 02/22/2018   LDLCALC 136 (H) 06/30/2019   ALT 21 06/30/2019   AST 17 06/30/2019   NA 139 06/30/2019   K 3.7 06/30/2019  CL 106 06/30/2019   CREATININE 0.63 06/30/2019   BUN 14 06/30/2019   CO2 30 06/30/2019   TSH 0.73 06/30/2019   HGBA1C 5.7 06/30/2019   Assessment/Plan:  Cassandra Gallagher is a 63 y.o. White or Caucasian [1] female with  has a past medical history of Anxiety (06/02/2010), BACK PAIN (07/26/2007), Cervical spine degeneration (01/20/2011), Cervicalgia (07/26/2007), DEGENERATIVE DISC DISEASE, CERVICAL SPINE (06/07/2009), DEPRESSIVE DISORDER (07/26/2007), Family history of breast cancer, Grave's disease, migraines, Hyperlipidemia (12/17/2011), Insomnia (06/02/2010), Lumbar degenerative disc disease (01/20/2011), Lump or mass in breast (11/14/2008), Palpitations (09/21/2008), and UTI (05/11/2007).  Encounter for well adult exam with abnormal findings Age and sex appropriate education and counseling updated with regular exercise and diet Referrals for preventative services - none needed Immunizations addressed - for flu shot, declines covid booster, for tdap and shingrix at pharmacy Smoking counseling  - none needed Evidence for depression or other mood disorder - stable anxiety depression Most recent labs reviewed. I have personally reviewed and have noted: 1) the patient's medical and social history 2) The patient's current medications and supplements 3) The patient's height, weight, and BMI have been recorded in the chart   Vitamin D deficiency Last vitamin D Lab Results  Component Value Date   VD25OH 35.05 06/30/2019   Low, to start oral replacement   Hypothyroidism Lab Results  Component Value Date   TSH 0.73 06/30/2019   Stable, pt to continue off med for now,  to f/u any worsening symptoms or concerns  Hyperlipidemia Lab Results  Component Value Date   LDLCALC 136 (H) 06/30/2019   Uncontrolled,, pt not taking statin but willing for card ct score    Hyperglycemia Lab Results  Component Value Date   HGBA1C 5.7 06/30/2019   Stable, pt to continue current medical treatment  - diet, wt control   Anxiety Chronic stable, cont current med tx - klonopin bid prn  Followup: Return in about 1 year (around 03/29/2024).  Oliver Barre, MD 04/03/2023  6:35 PM  Medical Group  Primary Care - [facility]

## 2023-04-03 ENCOUNTER — Encounter: Payer: Self-pay | Admitting: Internal Medicine

## 2023-04-03 DIAGNOSIS — E559 Vitamin D deficiency, unspecified: Secondary | ICD-10-CM | POA: Insufficient documentation

## 2023-04-03 NOTE — Assessment & Plan Note (Signed)
 Lab Results  Component Value Date   LDLCALC 136 (H) 06/30/2019   Uncontrolled,, pt not taking statin but willing for card ct score

## 2023-04-03 NOTE — Assessment & Plan Note (Signed)
 Chronic stable, cont current med tx - klonopin bid prn

## 2023-04-03 NOTE — Assessment & Plan Note (Signed)
 Age and sex appropriate education and counseling updated with regular exercise and diet Referrals for preventative services - none needed Immunizations addressed - for flu shot, declines covid booster, for tdap and shingrix at pharmacy Smoking counseling  - none needed Evidence for depression or other mood disorder - stable anxiety depression Most recent labs reviewed. I have personally reviewed and have noted: 1) the patient's medical and social history 2) The patient's current medications and supplements 3) The patient's height, weight, and BMI have been recorded in the chart

## 2023-04-03 NOTE — Assessment & Plan Note (Signed)
 Lab Results  Component Value Date   HGBA1C 5.7 06/30/2019   Stable, pt to continue current medical treatment  - diet, wt control

## 2023-04-03 NOTE — Assessment & Plan Note (Signed)
 Lab Results  Component Value Date   TSH 0.73 06/30/2019   Stable, pt to continue off med for now,  to f/u any worsening symptoms or concerns

## 2023-04-03 NOTE — Assessment & Plan Note (Signed)
 Last vitamin D Lab Results  Component Value Date   VD25OH 35.05 06/30/2019   Low, to start oral replacement

## 2023-04-05 ENCOUNTER — Encounter: Payer: Self-pay | Admitting: Hematology and Oncology

## 2023-04-05 ENCOUNTER — Telehealth: Payer: Self-pay

## 2023-04-05 NOTE — Telephone Encounter (Signed)
 Spoke with patient and she has decided to see another provider in [facility].  Wanted to cancel the appointment.  Sent message to Dr Al Pimple and also to Leonette Most to cancel.

## 2023-04-06 ENCOUNTER — Inpatient Hospital Stay: Payer: 59 | Admitting: Hematology and Oncology

## 2023-04-12 ENCOUNTER — Ambulatory Visit: Payer: 59 | Admitting: Clinical

## 2023-04-12 DIAGNOSIS — F4321 Adjustment disorder with depressed mood: Secondary | ICD-10-CM | POA: Diagnosis not present

## 2023-04-12 NOTE — Progress Notes (Signed)
 Time: 12:00pm-1:00pm Diagnosis: F43.21 CPT Code: 16109U  Tomeka was seen in person for individual therapy. Session focused on continuing to better understand patterns she may have carried forward from her past that she may wish to change. Therapist pointed out a possible tendency to put herself first, and Neveyah agreed. Therapist encouraged her to consider how she might be able to prioritize her goal of connecting with others, even if she needs to do so by focusing on the possible benefits of doing so for ] her daughters. She will reach out to schedule future appointments.  Intake Presenting Problem Deannie described her life as stable overall, but reported that she has grappled with feelings of loneliness and difficulty with motivation for an extended period. She would like a stronger social support network. She has two 7 year-old children. She is from [place] and her husband in [place]. She reported that, while in [place], she had a diverse and extensive social network, and she misses this. She has lived in [place] for 15 years. She reported that she does not yet feel home, and has stayed in [place] for her husband's job. She struggles with motivation to initiate tasks she enjoys and reported a sense of missing her connection with others. She was previously married for 15 years before her first marriage ended in 2008. She and her current husband began living together in 2010. She works in Hotel manager.  Symptoms Problems in family of origin: Fe has a sister and three brothers. One of her brothers passed of lung cancer during covid. She reported that her family struggled financially and her father was an alcoholic until he was 24. This resulted in some volatility during her teen years. She had minimal emotional support as a teenager. At intake, she described her relationship with her family of origin as having been better in the past.  She described her brother who passed as the sibling  she trusted the most. She and her sister both have twin children, and have connected over this. Camera' mother passed in 2014.   Risk Assessment:  Not at present. Client reported passing suicidal ideation without plan or intent at the time of the end of her first marriage in 2008.   Diagnoses:   Family: Charise lives with husband and 95 year-old twin daughters.   Medical: Candy has experienced chronic pain for about 13 years. She reported that, at 63 years old, she began waking in "extraordinary pain." This included recurrent migraines resulting in repeated vomiting. She is under the regular care of Dr. Estella Husk for neurology and reported symptoms have improved. At intake, she was prescribed atorvastatin, ajovi injections for migraines, Tylenol 3 as needed.   Substance use: Solenne does not drink or use any substances. She used to drink socially, but her husband does not, so she eventually stopped.   No needy reported when aske General Behavior: cooperative  Attire: appropriate  Gait: normal  Motor Activity: normal  Stream of Thought - Productivity:   Stream of thought - Progression: normal  Stream of thought - Language: normal  Emotional tone and reactions - Mood: normal  Emotional tone and reactions - Affect: appropriate  Mental trend/Content of thoughts - Perception: normal  Mental trend/Content of thoughts - Orientation: normal  Mental trend/Content of thoughts - Memory: normal  Mental trend/Content of thoughts - General knowledge: consistent with education  Insight: good  Judgment: good  Diagnostic Summary  Adjustment disorder with depression (f43.22)  Treatment Plan Client Abilities/Strengths   Client Treatment Preferences  Lyrika prefers in person appointments.  Client Statement of Needs Benisha would like to increase feelings of social connection and peace with her life.   Treatment Level  weekly   Symptoms  Lack of motivation, difficulty initiating tasks,  depressed mood Problems Addressed  Arnelle feels disconnected from her surrounding community.  Goals 1. Randye would like to improve and develop her connections with others Objectives Kameka would like to schedule regular tennis dates with others Target Date: 02/25/2024 Frequency: weekly  Progress: 0 Modality: Individual therapy   Objectives 2. Rayven would like to feel more settled and content in her life as it is.  Kariah will improve her feelings about her work situations. Target: 02/25/2024, Modality: Individual Frequency: weekly, Progress: 0 Nelani would like to identify interests to share with her husband outside of work  Objective  Interventions Zane will have opportunities to process her experiences in session Therapist will help Telly notice and disengage from maladaptive thoughts and behaviors using CBT based strategies Therapist will engage Shannara in behavior activation, including pleasant event scheduling and incorporation of structure and mastery events into her routine. Therapist will provide referrals for additional resources as needed               Chrissie Noa, PhD               Chrissie Noa, PhD               Chrissie Noa, PhD

## 2023-04-15 ENCOUNTER — Encounter: Payer: Self-pay | Admitting: Internal Medicine

## 2023-04-15 ENCOUNTER — Ambulatory Visit (HOSPITAL_COMMUNITY)
Admission: RE | Admit: 2023-04-15 | Discharge: 2023-04-15 | Disposition: A | Discharge status: Home / Self Care | Payer: Self-pay | Source: Ambulatory Visit | Attending: Internal Medicine | Admitting: Internal Medicine

## 2023-04-15 DIAGNOSIS — R9431 Abnormal electrocardiogram [ECG] [EKG]: Secondary | ICD-10-CM | POA: Insufficient documentation

## 2023-04-15 DIAGNOSIS — R739 Hyperglycemia, unspecified: Secondary | ICD-10-CM | POA: Insufficient documentation

## 2023-04-15 DIAGNOSIS — E785 Hyperlipidemia, unspecified: Secondary | ICD-10-CM | POA: Insufficient documentation

## 2023-07-16 ENCOUNTER — Encounter: Payer: Self-pay | Admitting: Internal Medicine

## 2023-07-19 ENCOUNTER — Telehealth: Payer: Self-pay | Admitting: Internal Medicine

## 2023-07-19 NOTE — Telephone Encounter (Signed)
 Copied from CRM [id number]. Topic: General - Other >> Jul 19, 2023  3:45 PM Cassandra Gallagher wrote: Reason for CRM: Patient is calling to follow up regarding MyChart message that was sent on 6/6 that reads,  "Hi Dr Autry Legions My family is leaving for [place] for six weeks. We are leaving June 10 and returning on July 21.   I last filled mu clonazapam prescription on May 21.  So would normally pick it up again on June 21. However I will be in [place] on vacation with my family at that time.   Would it be possible to pick it up on June 9, just before we leave and then not fill again until we return after July 21.   Could you let me know if this vacation refill is possible.  Thank you. And have a good weekend.  [place]"  Patient's PCP Dr. Rosalia Colonel is out of office at the moment and patient leaves tomorrow for vacation. She uses [facility]. please advise.

## 2023-07-20 NOTE — Telephone Encounter (Signed)
 Staff to please contact pt pharmacy where she gets klonopin   Ok for early refill this time only  thanks

## 2023-07-21 NOTE — Telephone Encounter (Signed)
This has been addressed via My chart.

## 2023-07-23 NOTE — Telephone Encounter (Signed)
 Called and spoke with pharmacist. Cassandra Gallagher verbal confirmation that this med could be picked up early. Unfortunately it looks like patient might already be out of the country.

## 2023-10-12 ENCOUNTER — Other Ambulatory Visit: Payer: Self-pay | Admitting: Internal Medicine

## 2023-11-17 ENCOUNTER — Encounter: Payer: Self-pay | Admitting: Emergency Medicine

## 2023-11-17 ENCOUNTER — Ambulatory Visit: Admitting: Emergency Medicine

## 2023-11-17 VITALS — BP 108/68 | HR 76 | Temp 98.2°F | Ht 66.0 in | Wt 162.0 lb

## 2023-11-17 DIAGNOSIS — H9202 Otalgia, left ear: Secondary | ICD-10-CM | POA: Diagnosis not present

## 2023-11-17 DIAGNOSIS — H6690 Otitis media, unspecified, unspecified ear: Secondary | ICD-10-CM | POA: Diagnosis not present

## 2023-11-17 MED ORDER — HYDROCORTISONE-ACETIC ACID 1-2 % OT SOLN: 3.0000 [drp] | Freq: Three times a day (TID) | OTIC | 1 refills | Status: AC

## 2023-11-17 MED ORDER — AMOXICILLIN-POT CLAVULANATE 875-125 MG PO TABS: 1.0000 | ORAL_TABLET | Freq: Two times a day (BID) | ORAL | 0 refills | Status: AC

## 2023-11-17 NOTE — Patient Instructions (Signed)
Earache, Adult An earache, or ear pain, can be caused by many things, including: An infection. Ear wax buildup. Ear pressure. Something in the ear that should not be there (foreign body). A sore throat. Tooth problems. Jaw problems. Treatment of the earache will depend on the cause. If the cause is not clear or cannot be known, you may need to watch your symptoms until your earache goes away or until a cause is found. Follow these instructions at home: Medicines Take or apply over-the-counter and prescription medicines only as told by your health care provider. If you were prescribed antibiotics, use them as told by your health care provider. Do not stop using the antibiotic even if you start to feel better. Do not put anything in your ear other than medicine that is prescribed by your health care provider. Managing pain     If directed, apply heat to the affected area as often as told by your health care provider. Use the heat source that your health care provider recommends, such as a moist heat pack or a heating pad. Place a towel between your skin and the heat source. Leave the heat on for 20-30 minutes. If your skin turns bright red, remove the heat right away to prevent burns. The risk of burns is higher if you cannot feel pain, heat, or cold. If directed, put ice on the affected area. To do this: Put ice in a plastic bag. Place a towel between your skin and the bag. Leave the ice on for 20 minutes, 2-3 times a day. If your skin turns bright red, remove the ice right away to prevent skin damage. The risk of skin damage is higher if you cannot feel pain, heat, or cold.  General instructions Pay attention to any changes in your symptoms. Try resting in an upright position instead of lying down. This may help to reduce pressure in your ear and relieve pain. Chew gum if it helps to relieve your ear pain. Treat any allergies as told by your health care provider. Drink enough fluid  to keep your urine pale yellow. It is up to you to get the results of any tests that were done. Ask your health care provider, or the department that is doing the tests, when your results will be ready. Contact a health care provider if: Your pain does not improve within 2 days. Your earache gets worse. You have new symptoms. You have a fever. Get help right away if: You have a severe headache. You have a stiff neck. You have trouble swallowing. You have redness or swelling behind your ear. You have fluid or blood coming from your ear. You have hearing loss. You feel dizzy. This information is not intended to replace advice given to you by your health care provider. Make sure you discuss any questions you have with your health care provider. Document Revised: 06/09/2021 Document Reviewed: 06/09/2021 Elsevier Patient Education  2024 Elsevier Inc.  

## 2023-11-17 NOTE — Assessment & Plan Note (Signed)
 Clinically stable.  No complications. Recommend to start Augmentin  875 mg twice a day for 7 days Symptom management discussed Advised to contact the office if no better or worse during the next several days

## 2023-11-17 NOTE — Progress Notes (Signed)
 Cassandra Gallagher 63 y.o.   Chief Complaint  Patient presents with   Ear Pain    Patient here for what she believes is an ear infection. Started a couple of weeks ago not sure if that last time she had the ear infection if it got fully better. No other symptoms     HISTORY OF PRESENT ILLNESS: This is a 63 y.o. female complaining of left ear pain and fullness for couple weeks No other associated symptoms No other complaints or medical concerns today   HPI   Prior to Admission medications   Medication Sig Start Date End Date Taking? Authorizing Provider  atorvastatin  (LIPITOR) 10 MG tablet Take 1 tablet every day by oral route. 03/30/23  Yes Norleen Lynwood ORN, MD  clonazePAM  (KLONOPIN ) 0.5 MG tablet Take 1 tablet (0.5 mg total) by mouth 2 (two) times daily as needed. 10/12/23  Yes Norleen Lynwood ORN, MD  folic acid (FOLVITE) 1 MG tablet Take by mouth.   Yes [provider]  Multiple Vitamins-Minerals (WOMENS MULTIVITAMIN) TABS as directed Orally   Yes [provider]  RIZATRIPTAN  BENZOATE PO Take 1 tablet by mouth daily as needed (migraines).   Yes [provider]  tretinoin (RETIN-A) 0.025 % cream 1 application in the evening to face Externally pea sized amount once at night for 30 days Patient taking differently: Apply topically. 01/13/22  Yes [provider]  clobetasol (TEMOVATE) 0.05 % external solution 1 application Externally Once a day for 30 days Patient not taking: Reported on 11/17/2023 01/13/22   [provider]  fluocinolone (SYNALAR) 0.01 % external solution as directed externally once a day for 30 days Patient not taking: Reported on 11/17/2023    [provider]  Fremanezumab-vfrm 225 MG/1.5ML SOSY Inject 3 Syringes into the skin every 3 (three) months.  Patient not taking: Reported on 11/17/2023 09/29/17   [provider]  tiZANidine (ZANAFLEX) 4 MG tablet Take 4-8 mg by mouth daily as needed. Patient not taking:  Reported on 11/17/2023 02/27/22   [provider]    Allergies  Allergen Reactions   Paxlovid  [Nirmatrelvir -Ritonavir ] Hives    Patient Active Problem List   Diagnosis Date Noted   Vitamin D  deficiency 04/03/2023   Constipation 03/30/2023   Hypothyroidism 03/30/2023   DDD (degenerative disc disease), cervical 03/14/2022   Atypical lobular hyperplasia of breast 05/07/2021   Gastroesophageal reflux disease 04/25/2020   Nausea and vomiting 04/25/2020   LLQ pain 06/30/2019   Genetic testing 03/16/2019   Family history of breast cancer    Low back pain 01/16/2019   Left wrist pain 04/05/2018   Left lateral epicondylitis 04/05/2018   Sprain, tricep 04/05/2018   Dizziness 02/22/2018   Hyperglycemia 02/22/2018   Tick bite 07/14/2017   Nonallopathic lesion of thoracic region 09/30/2016   Nonallopathic lesion of cervical region 09/30/2016   Nonallopathic lesion of lumbosacral region 09/30/2016   Hair loss 05/06/2015   Cough 02/23/2015   Lumbosacral spondylosis without myelopathy 06/30/2013   Chronic headache disorder 05/30/2013   Cervico-occipital neuralgia 05/04/2013   Cephalalgia 05/04/2013   Cervical pain 05/04/2013   Lumbosacral radiculitis 04/18/2013   Displacement of lumbar intervertebral disc without myelopathy 04/18/2013   Arachnoid cyst 03/29/2013   Chronic migraine 02/23/2012   Hyperlipidemia 12/17/2011   Cervical spondylosis without myelopathy 10/30/2011   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   Lumbar degenerative disc disease 01/20/2011   Chronic pain 09/12/2010   Anxiety 06/02/2010   Insomnia 06/02/2010   Encounter for well  adult exam with abnormal findings 06/01/2010   Lump or mass in breast 11/14/2008   Depression 07/26/2007    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   Cervicalgia 07/26/2007   DEGENERATIVE DISC DISEASE, CERVICAL SPINE 06/07/2009   DEPRESSIVE  DISORDER 07/26/2007   Family history of breast cancer    Grave's disease    hx of Grave's Disease/Hyperthyroidism   Hx of migraines    Hyperlipidemia 12/17/2011   Insomnia 06/02/2010   Lumbar degenerative disc disease 01/20/2011   Lump or mass in breast 11/14/2008   Palpitations 09/21/2008   UTI 05/11/2007    Past Surgical History:  Procedure Laterality Date   BREAST BIOPSY Right    BREAST SURGERY Bilateral 01/25/2020   BREAST REDUCTION    CESAREAN SECTION  10/29/2008   twins   DILITATION & CURRETTAGE/HYSTROSCOPY WITH VERSAPOINT RESECTION N/A 07/20/2012   Procedure: DILATATION & CURETTAGE/HYSTEROSCOPY WITH VERSAPOINT RESECTION;  Surgeon: Percilla Burly, MD;  Location: [facility];  Service: Gynecology;  Laterality: N/A;   HYSTEROSCOPY WITH D & C  07/20/2012   REDUCTION MAMMAPLASTY      Social History   Socioeconomic History   Marital status: Married    Spouse name: Not on file   Number of children: 2   Years of education: Not on file   Highest education level: Not on file  Occupational History   Occupation: Arts development officer: [employer]  Tobacco Use   Smoking status: Former    Current packs/day: 0.00    Types: Cigarettes    Quit date: 11/24/1996    Years since quitting: 26.9   Smokeless tobacco: Never   Tobacco comments:    smoked for 6 years, quit 15 years ago  Vaping Use   Vaping status: Never Used  Substance and Sexual Activity   Alcohol  use: No   Drug use: No   Sexual activity: Yes    Birth control/protection: Post-menopausal    Comment: married- 20 yrs   Other Topics Concern   Not on file  Social History Narrative   2nd child born recently in September 2010.   Married, one child from previous marriage lives in [place]   Social Drivers of Health   Financial Resource Strain: Not on file  Food Insecurity: Not on file  Transportation Needs: Not on file  Physical Activity: Not on file  Stress: Not on file  Social Connections: Unknown  (06/23/2021)   [facility]   Social Network    Social Network: Not on file  Intimate Partner Violence: Unknown (05/14/2021)   Received from [facility]   HITS    Physically Hurt: Not on file    Insult or Talk Down To: Not on file    Threaten Physical Harm: Not on file    Scream or Curse: Not on file    Family History  Problem Relation Age of Onset   Hypertension Mother    Breast cancer Mother 14   Cancer Father        lung   Cancer Brother        lung    Breast cancer Maternal Aunt 71   Breast cancer Paternal Grandmother    Breast cancer Maternal Grandmother      Review of Systems  Constitutional:  Negative for chills and fever.  HENT:  Positive for ear pain.   Respiratory: Negative.  Negative for cough and  shortness of breath.   Cardiovascular: Negative.  Negative for chest pain and palpitations.  Gastrointestinal:  Negative for abdominal pain, nausea and vomiting.  Skin: Negative.  Negative for rash.  Neurological: Negative.  Negative for dizziness and headaches.    Today's Vitals   11/17/23 1257  BP: 108/68  Pulse: 76  Temp: 98.2 F (36.8 C)  TempSrc: Oral  SpO2: 96%  Weight: 162 lb (73.5 kg)  Height: 5' 6 (1.676 m)   Body mass index is 26.15 kg/m.   Physical Exam Vitals reviewed.  Constitutional:      Appearance: Normal appearance.  HENT:     Head: Normocephalic.     Right Ear: Tympanic membrane, ear canal and external ear normal.     Ears:     Comments: Mild swelling of canal and mild hyperemia of the tympanic membrane Eyes:     Extraocular Movements: Extraocular movements intact.  Cardiovascular:     Rate and Rhythm: Normal rate.  Pulmonary:     Effort: Pulmonary effort is normal.  Skin:    General: Skin is warm and dry.  Neurological:     General: No focal deficit present.     Mental Status: She is alert and oriented to person, place, and time.  Psychiatric:        Mood and Affect: Mood normal.        Behavior: Behavior  normal.      ASSESSMENT & PLAN: Problem List Items Addressed This Visit       Nervous and Auditory   Acute otalgia, left - Primary   Pain management discussed. May take Tylenol  and or Advil as needed Recommend VoSoL eardrops several times a day Contact the office if no better or worse during the next several days.      Relevant Medications   amoxicillin -clavulanate (AUGMENTIN ) 875-125 MG tablet   acetic acid-hydrocortisone (VOSOL-HC) OTIC solution   Ear infection   Clinically stable.  No complications. Recommend to start Augmentin  875 mg twice a day for 7 days Symptom management discussed Advised to contact the office if no better or worse during the next several days      Relevant Medications   amoxicillin -clavulanate (AUGMENTIN ) 875-125 MG tablet   acetic acid-hydrocortisone (VOSOL-HC) OTIC solution   Patient Instructions  Earache, Adult An earache, or ear pain, can be caused by many things, including: An infection. Ear wax buildup. Ear pressure. Something in the ear that should not be there (foreign body). A sore throat. Tooth problems. Jaw problems. Treatment of the earache will depend on the cause. If the cause is not clear or cannot be known, you may need to watch your symptoms until your earache goes away or until a cause is found. Follow these instructions at home: Medicines Take or apply over-the-counter and prescription medicines only as told by your health care provider. If you were prescribed antibiotics, use them as told by your health care provider. Do not stop using the antibiotic even if you start to feel better. Do not put anything in your ear other than medicine that is prescribed by your health care provider. Managing pain     If directed, apply heat to the affected area as often as told by your health care provider. Use the heat source that your health care provider recommends, such as a moist heat pack or a heating pad. Place a towel between  your skin and the heat source. Leave the heat on for 20-30 minutes. If your skin turns bright  red, remove the heat right away to prevent burns. The risk of burns is higher if you cannot feel pain, heat, or cold. If directed, put ice on the affected area. To do this: Put ice in a plastic bag. Place a towel between your skin and the bag. Leave the ice on for 20 minutes, 2-3 times a day. If your skin turns bright red, remove the ice right away to prevent skin damage. The risk of skin damage is higher if you cannot feel pain, heat, or cold.  General instructions Pay attention to any changes in your symptoms. Try resting in an upright position instead of lying down. This may help to reduce pressure in your ear and relieve pain. Chew gum if it helps to relieve your ear pain. Treat any allergies as told by your health care provider. Drink enough fluid to keep your urine pale yellow. It is up to you to get the results of any tests that were done. Ask your health care provider, or the department that is doing the tests, when your results will be ready. Contact a health care provider if: Your pain does not improve within 2 days. Your earache gets worse. You have new symptoms. You have a fever. Get help right away if: You have a severe headache. You have a stiff neck. You have trouble swallowing. You have redness or swelling behind your ear. You have fluid or blood coming from your ear. You have hearing loss. You feel dizzy. This information is not intended to replace advice given to you by your health care provider. Make sure you discuss any questions you have with your health care provider. Document Revised: 06/09/2021 Document Reviewed: 06/09/2021 Elsevier Patient Education  2024 Elsevier Inc.    Emil Schaumann, MD [facility]

## 2023-11-17 NOTE — Assessment & Plan Note (Signed)
 Pain management discussed. May take Tylenol  and or Advil as needed Recommend VoSoL eardrops several times a day Contact the office if no better or worse during the next several days.

## 2023-11-18 ENCOUNTER — Other Ambulatory Visit (HOSPITAL_COMMUNITY): Payer: Self-pay

## 2023-11-18 ENCOUNTER — Telehealth: Payer: Self-pay

## 2023-11-18 NOTE — Telephone Encounter (Signed)
 Pharmacy Patient Advocate Encounter  Received notification from [facility] that Prior Authorization for Hydrocortisone-Acetic Acid 1-2% solutionhas been APPROVED from 11/18/2023 to 11/17/2024. Ran test claim, Copay is $5.00. This test claim was processed through [facility]- copay amounts may vary at other pharmacies due to pharmacy/plan contracts, or as the patient moves through the different stages of their insurance plan.   PA #/Case ID/Reference #: [id number]

## 2023-11-18 NOTE — Telephone Encounter (Addendum)
 Pharmacy Patient Advocate Encounter   Received notification from CoverMyMeds that prior authorization for Hydrocortisone-Acetic Acid 1-2% solution  is required/requested.   Insurance verification completed.   The patient is insured through [facility].   Per test claim: Per test claim, medication is not covered due to plan/benefit exclusion, I will proceed with PA but per test billing results below and   PLEASE BE ADVISE THAT HER IS A DISCOUNT CARD JUST INCASE PT CAN NOT WAIT FOR PA   this is if she was to [facility]. I'm not sure if [facility] takes discount cards

## 2023-11-18 NOTE — Telephone Encounter (Signed)
 Pharmacy Patient Advocate Encounter   Received notification from CoverMyMeds that prior authorization for Hydrocortisone-Acetic Acid 1-2% solution   is required/requested.   Insurance verification completed.   The patient is insured through [facility].   Per test claim: PA required; PA submitted to above mentioned insurance via Latent Key/confirmation #/EOC [id number] Status is pending

## 2024-02-23 ENCOUNTER — Ambulatory Visit: Admitting: Internal Medicine

## 2024-02-23 ENCOUNTER — Encounter: Payer: Self-pay | Admitting: Internal Medicine

## 2024-02-23 VITALS — BP 118/82 | HR 89 | Temp 98.3°F | Ht 66.0 in | Wt 168.0 lb

## 2024-02-23 DIAGNOSIS — Z0000 Encounter for general adult medical examination without abnormal findings: Secondary | ICD-10-CM | POA: Diagnosis not present

## 2024-02-23 DIAGNOSIS — E785 Hyperlipidemia, unspecified: Secondary | ICD-10-CM

## 2024-02-23 DIAGNOSIS — E669 Obesity, unspecified: Secondary | ICD-10-CM

## 2024-02-23 DIAGNOSIS — Z6827 Body mass index (BMI) 27.0-27.9, adult: Secondary | ICD-10-CM | POA: Diagnosis not present

## 2024-02-23 DIAGNOSIS — M778 Other enthesopathies, not elsewhere classified: Secondary | ICD-10-CM

## 2024-02-23 DIAGNOSIS — M7072 Other bursitis of hip, left hip: Secondary | ICD-10-CM | POA: Diagnosis not present

## 2024-02-23 DIAGNOSIS — E559 Vitamin D deficiency, unspecified: Secondary | ICD-10-CM

## 2024-02-23 DIAGNOSIS — R739 Hyperglycemia, unspecified: Secondary | ICD-10-CM

## 2024-02-23 MED ORDER — WEGOVY 0.25 MG/0.5ML ~~LOC~~ SOAJ: 0.2500 mg | SUBCUTANEOUS | 11 refills | Status: AC

## 2024-02-23 NOTE — Assessment & Plan Note (Signed)
 Age and sex appropriate education and counseling updated with regular exercise and diet Referrals for preventative services - none needed Immunizations addressed - for tdap and prevnar  Smoking counseling  - none needed Evidence for depression or other mood disorder - none significant Most recent labs reviewed. I have personally reviewed and have noted: 1) the patient's medical and social history 2) The patient's current medications and supplements 3) The patient's height, weight, and BMI have been recorded in the chart

## 2024-02-23 NOTE — Assessment & Plan Note (Signed)
 Mild, has o/w good function, for voltaren  gel prn, and consider hand surgury if worsening

## 2024-02-23 NOTE — Assessment & Plan Note (Signed)
 Last vitamin D  Lab Results  Component Value Date   VD25OH 35.05 06/30/2019   Low, to cont oral replacement, f/u lab

## 2024-02-23 NOTE — Assessment & Plan Note (Signed)
 Lab Results  Component Value Date   HGBA1C 5.7 06/30/2019   Stable, pt to continue current medical treatment  - diet, wt control

## 2024-02-23 NOTE — Assessment & Plan Note (Signed)
 Lab Results  Component Value Date   LDLCALC 136 (H) 06/30/2019   Uncontrolled, continue lipitor 10 mg, for f/u lab today

## 2024-02-23 NOTE — Progress Notes (Signed)
 Patient ID: Cassandra Gallagher, female   DOB: 09-16-1960, 64 y.o.   MRN: [id number]         Chief Complaint:: wellness exam and palmar tendonitis, left hip bursitis, obesity       HPI:  Cassandra Gallagher is a 64 y.o. female here for wellness exam; declines prevnar and tdap for now,  o.w up to date                Also has bilateral palmar tender palms with hard knots to the bilateral 3rd finger palmar tendons, mild.  Pt denies chest pain, increased sob or doe, wheezing, orthopnea, PND, increased LE swelling, palpitations, dizziness or syncope.   Pt denies polydipsia, polyuria, or new focal neuro s/s.    Pt denies fever, wt loss, night sweats, loss of appetite, or other constitutional symptoms   In fact has gained several lbs, asks for wegovy  start  Also has several wks left lateral hip pain, worse to sleep, but is forced to sleep on left side at night due to her neck condition   Wt Readings from Last 3 Encounters:  02/23/24 168 lb (76.2 kg)  11/17/23 162 lb (73.5 kg)  03/30/23 168 lb (76.2 kg)   BP Readings from Last 3 Encounters:  02/23/24 118/82  11/17/23 108/68  03/30/23 122/74   Immunization History  Administered Date(s) Administered   Influenza Split 10/26/2011   Influenza, Seasonal, Injecte, Preservative Fre 03/30/2023   Influenza,inj,Quad PF,6+ Mos 12/21/2017, 12/13/2018, 11/14/2020   Influenza-Unspecified 11/08/2023   PFIZER(Purple Top)SARS-COV-2 Vaccination 04/17/2019, 05/08/2019, 11/08/2023   Pneumococcal Conjugate-13 04/25/2018   Tdap 10/26/2011   Zoster Recombinant(Shingrix) 05/12/2021, 02/09/2022, 04/13/2022   Health Maintenance Due  Topic Date Due   Pneumococcal Vaccine: 50+ Years (2 of 2 - PCV20 or PCV21) 04/25/2019   DTaP/Tdap/Td (2 - Td or Tdap) 10/25/2021      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   Cervicalgia 07/26/2007   DEGENERATIVE DISC DISEASE, CERVICAL SPINE 06/07/2009    DEPRESSIVE DISORDER 07/26/2007   Family history of breast cancer    Grave's disease    hx of Grave's Disease/Hyperthyroidism   Hx of migraines    Hyperlipidemia 12/17/2011   Insomnia 06/02/2010   Lumbar degenerative disc disease 01/20/2011   Lump or mass in breast 11/14/2008   Palpitations 09/21/2008   UTI 05/11/2007   Past Surgical History:  Procedure Laterality Date   BREAST BIOPSY Right    BREAST SURGERY Bilateral 01/25/2020   BREAST REDUCTION    CESAREAN SECTION  10/29/2008   twins   DILITATION & CURRETTAGE/HYSTROSCOPY WITH VERSAPOINT RESECTION N/A 07/20/2012   Procedure: DILATATION & CURETTAGE/HYSTEROSCOPY WITH VERSAPOINT RESECTION;  Surgeon: Percilla Burly, MD;  Location: [facility];  Service: Gynecology;  Laterality: N/A;   HYSTEROSCOPY WITH D & C  07/20/2012   REDUCTION MAMMAPLASTY      reports that she quit smoking about 27 years ago. Her smoking use included cigarettes. She has never used smokeless tobacco. She reports that she does not drink alcohol  and does not use drugs. family history includes Breast cancer in her maternal grandmother and paternal grandmother; Breast cancer (age of onset: 36) in her maternal aunt; Breast cancer (age of onset: 81) in her mother; Cancer in her brother and father; Hypertension in her mother. Allergies[1] Medications Ordered Prior to Encounter[2]      ROS:  All others reviewed and negative.  Objective  PE:  BP 118/82   Pulse 89   Temp 98.3 F (36.8 C) (Temporal)   Ht 5' 6 (1.676 m)   Wt 168 lb (76.2 kg)   LMP 04/04/2012   SpO2 97%   BMI 27.12 kg/m                 Constitutional: Pt appears in NAD               HENT: Head: NCAT.                Right Ear: External ear normal.                 Left Ear: External ear normal.                Eyes: . Pupils are equal, round, and reactive to light. Conjunctivae and EOM are normal               Nose: without d/c or deformity               Neck: Neck supple. Gross normal ROM                Cardiovascular: Normal rate and regular rhythm.                 Pulmonary/Chest: Effort normal and breath sounds without rales or wheezing.                Abd:  Soft, NT, ND, + BS, no organomegaly               Neurological: Pt is alert. At baseline orientation, motor grossly intact; bilateral palms with mild tender 3rd finger tendonitis but FROM, also had mild to mod tender over left greater trochanter               Skin: Skin is warm. No rashes, no other new lesions, LE edema - none               Psychiatric: Pt behavior is normal without agitation   Micro: none  Cardiac tracings I have personally interpreted today:  none  Pertinent Radiological findings (summarize): none   Lab Results  Component Value Date   WBC 5.7 06/30/2019   HGB 13.3 06/30/2019   HCT 39.6 06/30/2019   PLT 259.0 06/30/2019   GLUCOSE 105 (H) 06/30/2019   CHOL 202 (H) 06/30/2019   TRIG 129.0 06/30/2019   HDL 40.50 06/30/2019   LDLDIRECT 186.0 02/22/2018   LDLCALC 136 (H) 06/30/2019   ALT 21 06/30/2019   AST 17 06/30/2019   NA 139 06/30/2019   K 3.7 06/30/2019   CL 106 06/30/2019   CREATININE 0.63 06/30/2019   BUN 14 06/30/2019   CO2 30 06/30/2019   TSH 0.73 06/30/2019   HGBA1C 5.7 06/30/2019   Assessment/Plan:  Cassandra Gallagher is a 64 y.o. White or Caucasian [1] female with  has a past medical history of Anxiety (06/02/2010), BACK PAIN (07/26/2007), Cervical spine degeneration (01/20/2011), Cervicalgia (07/26/2007), DEGENERATIVE DISC DISEASE, CERVICAL SPINE (06/07/2009), DEPRESSIVE DISORDER (07/26/2007), Family history of breast cancer, Grave's disease, migraines, Hyperlipidemia (12/17/2011), Insomnia (06/02/2010), Lumbar degenerative disc disease (01/20/2011), Lump or mass in breast (11/14/2008), Palpitations (09/21/2008), and UTI (05/11/2007).  Preventative health care Age and sex appropriate education and counseling updated with regular exercise and diet Referrals for preventative services - none  needed Immunizations addressed - for tdap and prevnar  Smoking counseling  - none needed Evidence for depression  or other mood disorder - none significant Most recent labs reviewed. I have personally reviewed and have noted: 1) the patient's medical and social history 2) The patient's current medications and supplements 3) The patient's height, weight, and BMI have been recorded in the chart   Vitamin D  deficiency Last vitamin D  Lab Results  Component Value Date   VD25OH 35.05 06/30/2019   Low, to cont oral replacement, f/u lab   Hyperlipidemia Lab Results  Component Value Date   LDLCALC 136 (H) 06/30/2019   Uncontrolled, continue lipitor 10 mg, for f/u lab today   Hyperglycemia Lab Results  Component Value Date   HGBA1C 5.7 06/30/2019   Stable, pt to continue current medical treatment  - diet, wt control   Hand tendonitis Mild, has o/w good function, for voltaren  gel prn, and consider hand surgury if worsening  Bursitis of left hip Mild to mod, already has ortho appt jan 27  Obesity (BMI 30-39.9) Ok for wegovy  0.25 mg weekly, consider increased to 0.5 in 1 mo if tolerates  Followup: Return in about 1 year (around 02/22/2025).  Lynwood Rush, MD 02/23/2024 7:32 PM [facility]     [1]  Allergies Allergen Reactions   Paxlovid  [Nirmatrelvir -Ritonavir ] Hives  [2]  Current Outpatient Medications on File Prior to Visit  Medication Sig Dispense Refill   acetic acid -hydrocortisone  (VOSOL -HC) OTIC solution Place 3 drops into the left ear 3 (three) times daily. 10 mL 1   atorvastatin  (LIPITOR) 10 MG tablet Take 1 tablet every day by oral route. 90 tablet 3   clonazePAM  (KLONOPIN ) 0.5 MG tablet Take 1 tablet (0.5 mg total) by mouth 2 (two) times daily as needed. 60 tablet 5   folic acid (FOLVITE) 1 MG tablet Take by mouth.     Multiple Vitamins-Minerals (WOMENS MULTIVITAMIN) TABS as directed Orally      RIZATRIPTAN  BENZOATE PO Take 1 tablet by mouth daily as needed (migraines).     clobetasol (TEMOVATE) 0.05 % external solution 1 application Externally Once a day for 30 days (Patient not taking: Reported on 02/23/2024)     fluocinolone (SYNALAR) 0.01 % external solution as directed externally once a day for 30 days (Patient not taking: Reported on 02/23/2024)     Fremanezumab-vfrm 225 MG/1.5ML SOSY Inject 3 Syringes into the skin every 3 (three) months.  (Patient not taking: Reported on 02/23/2024)     tiZANidine (ZANAFLEX) 4 MG tablet Take 4-8 mg by mouth daily as needed. (Patient not taking: Reported on 02/23/2024)     tretinoin (RETIN-A) 0.025 % cream 1 application in the evening to face Externally pea sized amount once at night for 30 days (Patient not taking: Reported on 02/23/2024)     No current facility-administered medications on file prior to visit.

## 2024-02-23 NOTE — Patient Instructions (Signed)
 Please take all new medication as prescribed  - the wegovy  0.25 mg and call in 3-4 wks for possible increase in dose  Please continue all other medications as before, and refills have been done if requested.  Please have the pharmacy call with any other refills you may need.  Please continue your efforts at being more active, low cholesterol diet, and weight control.  You are otherwise up to date with prevention measures today.  Please keep your appointments with your specialists as you may have planned - orthopedic Jan 27  Please go to the LAB at the blood drawing area for the tests to be done  You will be contacted by phone if any changes need to be made immediately.  Otherwise, you will receive a letter about your results with an explanation, but please check with MyChart first.

## 2024-02-23 NOTE — Assessment & Plan Note (Signed)
 Mild to mod, already has ortho appt jan 27

## 2024-02-23 NOTE — Assessment & Plan Note (Signed)
 Ok for wegovy  0.25 mg weekly, consider increased to 0.5 in 1 mo if tolerates

## 2024-03-07 ENCOUNTER — Ambulatory Visit: Admitting: Orthopaedic Surgery

## 2024-03-07 ENCOUNTER — Other Ambulatory Visit (INDEPENDENT_AMBULATORY_CARE_PROVIDER_SITE_OTHER)

## 2024-03-07 VITALS — Ht 65.75 in | Wt 165.2 lb

## 2024-03-07 DIAGNOSIS — M25552 Pain in left hip: Secondary | ICD-10-CM

## 2024-03-07 MED ORDER — MELOXICAM 15 MG PO TABS: 15.0000 mg | ORAL_TABLET | Freq: Every day | ORAL | 2 refills | Status: AC

## 2024-03-07 NOTE — Progress Notes (Signed)
 "  Office Visit Note   Patient: Cassandra Gallagher           Date of Birth: 10-24-1960           MRN: [id number] Visit Date: 03/07/2024              Requested by: Norleen Lynwood ORN, MD [address] [place],  [place] [address] PCP: Norleen Lynwood ORN, MD   Assessment & Plan: Visit Diagnoses:  1. Pain in left hip     Plan: Impression is left hip pain.  Could be occult arthritis or hip flexor tendinitis.  Less likely trochanteric bursitis.  Based on treatment options recommend taking meloxicam  for up to 2 weeks with antacid to reduce gastric irritation.  She will follow-up with Dr. Burnetta for intra-articular hip injection if symptoms do not improve.  Follow-Up Instructions: Return if symptoms worsen or fail to improve.   Orders:  Orders Placed This Encounter  Procedures   XR HIP UNILAT W OR W/O PELVIS 2-3 VIEWS LEFT   Meds ordered this encounter  Medications   meloxicam  (MOBIC ) 15 MG tablet    Sig: Take 1 tablet (15 mg total) by mouth daily.    Dispense:  14 tablet    Refill:  2      Procedures: No procedures performed   Clinical Data: No additional findings.   Subjective: No chief complaint on file.   HPI Cassandra Gallagher is a 64 year old female here for evaluation of increasing left hip pain for 6 months.  Feels lateral to posterior hip pain.  Denies any groin pain.  Denies any giving way.  Pain is worse at night. Review of Systems  Constitutional: Negative.   HENT: Negative.    Eyes: Negative.   Respiratory: Negative.    Cardiovascular: Negative.   Endocrine: Negative.   Musculoskeletal: Negative.   Neurological: Negative.   Hematological: Negative.   Psychiatric/Behavioral: Negative.    All other systems reviewed and are negative.    Objective: Vital Signs: Ht 5' 5.75 (1.67 m)   Wt 165 lb 3.2 oz (74.9 kg)   LMP 04/04/2012   BMI 26.87 kg/m   Physical Exam Vitals and nursing note reviewed.  Constitutional:      Appearance: She is well-developed.  HENT:      Head: Atraumatic.     Nose: Nose normal.  Eyes:     Extraocular Movements: Extraocular movements intact.  Cardiovascular:     Pulses: Normal pulses.  Pulmonary:     Effort: Pulmonary effort is normal.  Abdominal:     Palpations: Abdomen is soft.  Musculoskeletal:     Cervical back: Neck supple.  Skin:    General: Skin is warm.     Capillary Refill: Capillary refill takes less than 2 seconds.  Neurological:     Mental Status: She is alert. Mental status is at baseline.  Psychiatric:        Behavior: Behavior normal.        Thought Content: Thought content normal.        Judgment: Judgment normal.     Ortho Exam Examination of left hip shows excellent range of motion without pain.  She has some slight deep hip discomfort with hip external rotation and frog leg.  No trochanteric tenderness.  Negative Stinchfield sign.  Negative FADIR. Specialty Comments:  No specialty comments available.  Imaging: No results found.   PMFS History: Patient Active Problem List   Diagnosis Date Noted   Hand tendonitis 02/23/2024  Bursitis of left hip 02/23/2024   Obesity (BMI 30-39.9) 02/23/2024   Acute otalgia, left 11/17/2023   Ear infection 11/17/2023   Vitamin D  deficiency 04/03/2023   Constipation 03/30/2023   Hypothyroidism 03/30/2023   DDD (degenerative disc disease), cervical 03/14/2022   Atypical lobular hyperplasia of breast 05/07/2021   Gastroesophageal reflux disease 04/25/2020   Nausea and vomiting 04/25/2020   LLQ pain 06/30/2019   Genetic testing 03/16/2019   Family history of breast cancer    Low back pain 01/16/2019   Left wrist pain 04/05/2018   Left lateral epicondylitis 04/05/2018   Sprain, tricep 04/05/2018   Dizziness 02/22/2018   Hyperglycemia 02/22/2018   Tick bite 07/14/2017   Nonallopathic lesion of thoracic region 09/30/2016   Nonallopathic lesion of cervical region 09/30/2016   Nonallopathic lesion of lumbosacral region 09/30/2016   Hair loss  05/06/2015   Cough 02/23/2015   Lumbosacral spondylosis without myelopathy 06/30/2013   Chronic headache disorder 05/30/2013   Cervico-occipital neuralgia 05/04/2013   Cephalalgia 05/04/2013   Cervical pain 05/04/2013   Lumbosacral radiculitis 04/18/2013   Displacement of lumbar intervertebral disc without myelopathy 04/18/2013   Arachnoid cyst 03/29/2013   Chronic migraine 02/23/2012   Hyperlipidemia 12/17/2011   Cervical spondylosis without myelopathy 10/30/2011   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   Lumbar degenerative disc disease 01/20/2011   Chronic pain 09/12/2010   Anxiety 06/02/2010   Insomnia 06/02/2010   Preventative health care 06/01/2010   Lump or mass in breast 11/14/2008   Depression 07/26/2007   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   Cervicalgia 07/26/2007   DEGENERATIVE DISC DISEASE, CERVICAL SPINE 06/07/2009   DEPRESSIVE DISORDER 07/26/2007   Family history of breast cancer    Grave's disease    hx of Grave's Disease/Hyperthyroidism   Hx of migraines    Hyperlipidemia 12/17/2011   Insomnia 06/02/2010   Lumbar degenerative disc disease 01/20/2011   Lump or mass in breast 11/14/2008   Palpitations 09/21/2008   UTI 05/11/2007    Family History  Problem Relation Age of Onset   Hypertension Mother    Breast cancer Mother 82   Cancer Father        lung   Cancer Brother        lung    Breast cancer Maternal Aunt 60   Breast cancer Paternal Grandmother    Breast cancer Maternal Grandmother     Past Surgical History:  Procedure Laterality Date   BREAST BIOPSY Right    BREAST SURGERY Bilateral 01/25/2020   BREAST REDUCTION    CESAREAN SECTION  10/29/2008   twins   DILITATION & CURRETTAGE/HYSTROSCOPY WITH VERSAPOINT RESECTION N/A 07/20/2012   Procedure: DILATATION & CURETTAGE/HYSTEROSCOPY WITH VERSAPOINT RESECTION;  Surgeon: Marie-Lyne Lavoie, MD;  Location: [facility];   Service: Gynecology;  Laterality: N/A;   HYSTEROSCOPY WITH D & C  07/20/2012   REDUCTION MAMMAPLASTY     Social History   Occupational History   Occupation: Arts Development Officer: [employer]  Tobacco Use   Smoking status: Former    Current packs/day: 0.00    Types: Cigarettes    Quit date: 11/24/1996    Years since quitting: 27.3   Smokeless tobacco: Never   Tobacco comments:    smoked for 6 years, quit 15 years ago  Vaping Use   Vaping status: Never Used  Substance and Sexual Activity   Alcohol  use: No  Drug use: No   Sexual activity: Yes    Birth control/protection: Post-menopausal    Comment: married- 20 yrs         "

## 2024-03-14 ENCOUNTER — Ambulatory Visit: Admitting: Internal Medicine

## 2024-03-14 ENCOUNTER — Ambulatory Visit: Payer: Self-pay | Admitting: Internal Medicine

## 2024-03-14 ENCOUNTER — Ambulatory Visit

## 2024-03-14 ENCOUNTER — Encounter: Payer: Self-pay | Admitting: Internal Medicine

## 2024-03-14 ENCOUNTER — Ambulatory Visit (INDEPENDENT_AMBULATORY_CARE_PROVIDER_SITE_OTHER)

## 2024-03-14 VITALS — BP 110/74 | HR 71 | Temp 97.9°F | Ht 65.0 in | Wt 164.0 lb

## 2024-03-14 DIAGNOSIS — R8281 Pyuria: Secondary | ICD-10-CM | POA: Insufficient documentation

## 2024-03-14 DIAGNOSIS — R10811 Right upper quadrant abdominal tenderness: Secondary | ICD-10-CM | POA: Insufficient documentation

## 2024-03-14 DIAGNOSIS — I950 Idiopathic hypotension: Secondary | ICD-10-CM | POA: Insufficient documentation

## 2024-03-14 DIAGNOSIS — E785 Hyperlipidemia, unspecified: Secondary | ICD-10-CM | POA: Insufficient documentation

## 2024-03-14 DIAGNOSIS — E2839 Other primary ovarian failure: Secondary | ICD-10-CM | POA: Insufficient documentation

## 2024-03-14 LAB — CBC WITH DIFFERENTIAL/PLATELET
Basophils Absolute: 0 10*3/uL (ref 0.0–0.1)
Basophils Relative: 1 % (ref 0.0–3.0)
Eosinophils Absolute: 0.3 10*3/uL (ref 0.0–0.7)
Eosinophils Relative: 9 % — ABNORMAL HIGH (ref 0.0–5.0)
HCT: 40 % (ref 36.0–46.0)
Hemoglobin: 13.4 g/dL (ref 12.0–15.0)
Lymphocytes Relative: 25.9 % (ref 12.0–46.0)
Lymphs Abs: 0.9 10*3/uL (ref 0.7–4.0)
MCHC: 33.6 g/dL (ref 30.0–36.0)
MCV: 91.3 fl (ref 78.0–100.0)
Monocytes Absolute: 0.4 10*3/uL (ref 0.1–1.0)
Monocytes Relative: 11.5 % (ref 3.0–12.0)
Neutro Abs: 1.9 10*3/uL (ref 1.4–7.7)
Neutrophils Relative %: 52.6 % (ref 43.0–77.0)
Platelets: 271 10*3/uL (ref 150.0–400.0)
RBC: 4.38 Mil/uL (ref 3.87–5.11)
RDW: 12.4 % (ref 11.5–15.5)
WBC: 3.7 10*3/uL — ABNORMAL LOW (ref 4.0–10.5)

## 2024-03-14 LAB — LIPID PANEL
Cholesterol: 152 mg/dL (ref 28–200)
HDL: 28 mg/dL — ABNORMAL LOW
LDL Cholesterol: 85 mg/dL (ref 10–99)
NonHDL: 123.52
Total CHOL/HDL Ratio: 5
Triglycerides: 193 mg/dL — ABNORMAL HIGH (ref 10.0–149.0)
VLDL: 38.6 mg/dL (ref 0.0–40.0)

## 2024-03-14 LAB — URINALYSIS, ROUTINE W REFLEX MICROSCOPIC
Bilirubin Urine: NEGATIVE
Ketones, ur: NEGATIVE
Nitrite: NEGATIVE
Specific Gravity, Urine: <=1.005 — AB (ref 1.000–1.030)
Total Protein, Urine: NEGATIVE
Urine Glucose: NEGATIVE
Urobilinogen, UA: 0.2 (ref 0.0–1.0)
pH: 6 (ref 5.0–8.0)

## 2024-03-14 LAB — HEPATIC FUNCTION PANEL
ALT: 12 U/L (ref 3–35)
AST: 14 U/L (ref 5–37)
Albumin: 4.2 g/dL (ref 3.5–5.2)
Alkaline Phosphatase: 65 U/L (ref 39–117)
Bilirubin, Direct: 0.1 mg/dL (ref 0.1–0.3)
Total Bilirubin: 0.4 mg/dL (ref 0.2–1.2)
Total Protein: 7.1 g/dL (ref 6.0–8.3)

## 2024-03-14 LAB — CORTISOL: Cortisol, Plasma: 7.6 ug/dL

## 2024-03-14 LAB — BASIC METABOLIC PANEL WITH GFR
BUN: 11 mg/dL (ref 6–23)
CO2: 29 meq/L (ref 19–32)
Calcium: 9.5 mg/dL (ref 8.4–10.5)
Chloride: 102 meq/L (ref 96–112)
Creatinine, Ser: 0.72 mg/dL (ref 0.40–1.20)
GFR: 88 mL/min
Glucose, Bld: 63 mg/dL — ABNORMAL LOW (ref 70–99)
Potassium: 3.9 meq/L (ref 3.5–5.1)
Sodium: 139 meq/L (ref 135–145)

## 2024-03-14 LAB — LIPASE: Lipase: 15 U/L (ref 11.0–59.0)

## 2024-03-14 LAB — AMYLASE: Amylase: 43 U/L (ref 27–131)

## 2024-03-14 LAB — TSH: TSH: 1.22 u[IU]/mL (ref 0.35–5.50)

## 2024-03-16 ENCOUNTER — Encounter: Payer: Self-pay | Admitting: Internal Medicine

## 2024-03-17 ENCOUNTER — Other Ambulatory Visit: Payer: Self-pay | Admitting: Internal Medicine

## 2024-03-17 ENCOUNTER — Inpatient Hospital Stay: Admission: RE | Admit: 2024-03-17

## 2024-03-17 DIAGNOSIS — R10811 Right upper quadrant abdominal tenderness: Secondary | ICD-10-CM

## 2024-03-17 DIAGNOSIS — R8281 Pyuria: Secondary | ICD-10-CM

## 2024-03-17 LAB — CULTURE, URINE COMPREHENSIVE
MICRO NUMBER:: 17542660
SPECIMEN QUALITY:: ADEQUATE

## 2024-03-17 NOTE — Progress Notes (Signed)
 Repeat UA and culture have been ordered

## 2024-07-27 ENCOUNTER — Encounter: Admitting: Family Medicine
